# Patient Record
Sex: Male | Born: 1950
Health system: Southern US, Community
[De-identification: ages and names within clinical notes are randomized; demographics above are authoritative.]

## PROBLEM LIST (undated history)

## (undated) DIAGNOSIS — I10 Essential (primary) hypertension: Secondary | ICD-10-CM

## (undated) DIAGNOSIS — I700 Atherosclerosis of aorta: Secondary | ICD-10-CM

## (undated) DIAGNOSIS — N2889 Other specified disorders of kidney and ureter: Secondary | ICD-10-CM

## (undated) DIAGNOSIS — C649 Malignant neoplasm of unspecified kidney, except renal pelvis: Secondary | ICD-10-CM

## (undated) DIAGNOSIS — C3490 Malignant neoplasm of unspecified part of unspecified bronchus or lung: Secondary | ICD-10-CM

## (undated) DIAGNOSIS — G8929 Other chronic pain: Secondary | ICD-10-CM

## (undated) DIAGNOSIS — I2510 Atherosclerotic heart disease of native coronary artery without angina pectoris: Secondary | ICD-10-CM

## (undated) DIAGNOSIS — K219 Gastro-esophageal reflux disease without esophagitis: Secondary | ICD-10-CM

## (undated) DIAGNOSIS — F419 Anxiety disorder, unspecified: Secondary | ICD-10-CM

## (undated) DIAGNOSIS — M1990 Unspecified osteoarthritis, unspecified site: Secondary | ICD-10-CM

## (undated) DIAGNOSIS — R2681 Unsteadiness on feet: Secondary | ICD-10-CM

## (undated) DIAGNOSIS — E041 Nontoxic single thyroid nodule: Secondary | ICD-10-CM

## (undated) DIAGNOSIS — M545 Low back pain, unspecified: Secondary | ICD-10-CM

## (undated) DIAGNOSIS — J449 Chronic obstructive pulmonary disease, unspecified: Secondary | ICD-10-CM

## (undated) DIAGNOSIS — F32A Depression, unspecified: Secondary | ICD-10-CM

## (undated) DIAGNOSIS — R0600 Dyspnea, unspecified: Secondary | ICD-10-CM

## (undated) HISTORY — DX: Essential (primary) hypertension: I10

## (undated) HISTORY — DX: Atherosclerotic heart disease of native coronary artery without angina pectoris: I25.10

## (undated) HISTORY — PX: NECK SURGERY: SHX720

## (undated) HISTORY — DX: Chronic obstructive pulmonary disease, unspecified: J44.9

## (undated) HISTORY — PX: CHOLECYSTECTOMY: SHX55

## (undated) HISTORY — DX: Low back pain, unspecified: M54.50

## (undated) HISTORY — DX: Atherosclerosis of aorta: I70.0

## (undated) HISTORY — DX: Malignant neoplasm of unspecified kidney, except renal pelvis: C64.9

## (undated) HISTORY — DX: Other chronic pain: G89.29

## (undated) HISTORY — PX: FRACTURE SURGERY: SHX138

## (undated) HISTORY — DX: Unsteadiness on feet: R26.81

## (undated) MED FILL — Dexamethasone Sodium Phosphate Inj 100 MG/10ML: INTRAMUSCULAR | Qty: 2 | Status: AC

---
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
## 2007-12-12 ENCOUNTER — Encounter: Admission: RE | Admit: 2007-12-12 | Discharge: 2008-01-24 | Payer: Self-pay | Admitting: Neurosurgery

## 2014-05-02 DIAGNOSIS — F331 Major depressive disorder, recurrent, moderate: Secondary | ICD-10-CM | POA: Diagnosis not present

## 2014-05-02 DIAGNOSIS — I10 Essential (primary) hypertension: Secondary | ICD-10-CM | POA: Diagnosis not present

## 2014-05-02 DIAGNOSIS — E8881 Metabolic syndrome: Secondary | ICD-10-CM | POA: Diagnosis not present

## 2014-05-02 DIAGNOSIS — E6609 Other obesity due to excess calories: Secondary | ICD-10-CM | POA: Diagnosis not present

## 2014-05-02 DIAGNOSIS — F17210 Nicotine dependence, cigarettes, uncomplicated: Secondary | ICD-10-CM | POA: Diagnosis not present

## 2014-08-01 DIAGNOSIS — E6609 Other obesity due to excess calories: Secondary | ICD-10-CM | POA: Diagnosis not present

## 2014-08-01 DIAGNOSIS — E8881 Metabolic syndrome: Secondary | ICD-10-CM | POA: Diagnosis not present

## 2014-08-01 DIAGNOSIS — I10 Essential (primary) hypertension: Secondary | ICD-10-CM | POA: Diagnosis not present

## 2014-08-05 DIAGNOSIS — E8881 Metabolic syndrome: Secondary | ICD-10-CM | POA: Diagnosis not present

## 2014-08-05 DIAGNOSIS — E6609 Other obesity due to excess calories: Secondary | ICD-10-CM | POA: Diagnosis not present

## 2014-08-05 DIAGNOSIS — I10 Essential (primary) hypertension: Secondary | ICD-10-CM | POA: Diagnosis not present

## 2014-08-05 DIAGNOSIS — F17210 Nicotine dependence, cigarettes, uncomplicated: Secondary | ICD-10-CM | POA: Diagnosis not present

## 2014-08-05 DIAGNOSIS — F331 Major depressive disorder, recurrent, moderate: Secondary | ICD-10-CM | POA: Diagnosis not present

## 2014-09-18 DIAGNOSIS — M545 Low back pain: Secondary | ICD-10-CM | POA: Diagnosis not present

## 2014-09-18 DIAGNOSIS — M6281 Muscle weakness (generalized): Secondary | ICD-10-CM | POA: Diagnosis not present

## 2014-09-18 DIAGNOSIS — R262 Difficulty in walking, not elsewhere classified: Secondary | ICD-10-CM | POA: Diagnosis not present

## 2014-12-10 DIAGNOSIS — F331 Major depressive disorder, recurrent, moderate: Secondary | ICD-10-CM | POA: Diagnosis not present

## 2014-12-10 DIAGNOSIS — M5441 Lumbago with sciatica, right side: Secondary | ICD-10-CM | POA: Diagnosis not present

## 2014-12-10 DIAGNOSIS — I10 Essential (primary) hypertension: Secondary | ICD-10-CM | POA: Diagnosis not present

## 2014-12-10 DIAGNOSIS — M50022 Cervical disc disorder at C5-C6 level with myelopathy: Secondary | ICD-10-CM | POA: Diagnosis not present

## 2014-12-10 DIAGNOSIS — F17210 Nicotine dependence, cigarettes, uncomplicated: Secondary | ICD-10-CM | POA: Diagnosis not present

## 2014-12-10 DIAGNOSIS — Z23 Encounter for immunization: Secondary | ICD-10-CM | POA: Diagnosis not present

## 2014-12-22 DIAGNOSIS — M6281 Muscle weakness (generalized): Secondary | ICD-10-CM | POA: Diagnosis not present

## 2014-12-22 DIAGNOSIS — M545 Low back pain: Secondary | ICD-10-CM | POA: Diagnosis not present

## 2014-12-24 DIAGNOSIS — M1288 Other specific arthropathies, not elsewhere classified, other specified site: Secondary | ICD-10-CM | POA: Diagnosis not present

## 2014-12-24 DIAGNOSIS — M47816 Spondylosis without myelopathy or radiculopathy, lumbar region: Secondary | ICD-10-CM | POA: Diagnosis not present

## 2014-12-24 DIAGNOSIS — M4712 Other spondylosis with myelopathy, cervical region: Secondary | ICD-10-CM | POA: Diagnosis not present

## 2014-12-30 DIAGNOSIS — M545 Low back pain: Secondary | ICD-10-CM | POA: Diagnosis not present

## 2014-12-30 DIAGNOSIS — M6281 Muscle weakness (generalized): Secondary | ICD-10-CM | POA: Diagnosis not present

## 2015-01-05 DIAGNOSIS — M7052 Other bursitis of knee, left knee: Secondary | ICD-10-CM | POA: Diagnosis not present

## 2015-01-05 DIAGNOSIS — Z79899 Other long term (current) drug therapy: Secondary | ICD-10-CM | POA: Diagnosis not present

## 2015-01-05 DIAGNOSIS — M25562 Pain in left knee: Secondary | ICD-10-CM | POA: Diagnosis not present

## 2015-01-05 DIAGNOSIS — I10 Essential (primary) hypertension: Secondary | ICD-10-CM | POA: Diagnosis not present

## 2015-01-05 DIAGNOSIS — M179 Osteoarthritis of knee, unspecified: Secondary | ICD-10-CM | POA: Diagnosis not present

## 2015-01-05 DIAGNOSIS — M1712 Unilateral primary osteoarthritis, left knee: Secondary | ICD-10-CM | POA: Diagnosis not present

## 2015-01-05 DIAGNOSIS — M7042 Prepatellar bursitis, left knee: Secondary | ICD-10-CM | POA: Diagnosis not present

## 2015-01-09 DIAGNOSIS — M545 Low back pain: Secondary | ICD-10-CM | POA: Diagnosis not present

## 2015-01-09 DIAGNOSIS — M4712 Other spondylosis with myelopathy, cervical region: Secondary | ICD-10-CM | POA: Diagnosis not present

## 2015-01-09 DIAGNOSIS — M47816 Spondylosis without myelopathy or radiculopathy, lumbar region: Secondary | ICD-10-CM | POA: Diagnosis not present

## 2015-01-13 DIAGNOSIS — M47816 Spondylosis without myelopathy or radiculopathy, lumbar region: Secondary | ICD-10-CM | POA: Diagnosis not present

## 2015-01-13 DIAGNOSIS — F17210 Nicotine dependence, cigarettes, uncomplicated: Secondary | ICD-10-CM | POA: Diagnosis not present

## 2015-01-13 DIAGNOSIS — I10 Essential (primary) hypertension: Secondary | ICD-10-CM | POA: Diagnosis not present

## 2015-01-13 DIAGNOSIS — G8929 Other chronic pain: Secondary | ICD-10-CM | POA: Diagnosis not present

## 2015-01-13 DIAGNOSIS — F329 Major depressive disorder, single episode, unspecified: Secondary | ICD-10-CM | POA: Diagnosis not present

## 2015-01-13 DIAGNOSIS — Z79899 Other long term (current) drug therapy: Secondary | ICD-10-CM | POA: Diagnosis not present

## 2015-01-13 DIAGNOSIS — M545 Low back pain: Secondary | ICD-10-CM | POA: Diagnosis not present

## 2015-01-13 DIAGNOSIS — M4712 Other spondylosis with myelopathy, cervical region: Secondary | ICD-10-CM | POA: Diagnosis not present

## 2015-02-26 DIAGNOSIS — M545 Low back pain: Secondary | ICD-10-CM | POA: Diagnosis not present

## 2015-02-26 DIAGNOSIS — M47816 Spondylosis without myelopathy or radiculopathy, lumbar region: Secondary | ICD-10-CM | POA: Diagnosis not present

## 2015-04-06 DIAGNOSIS — E8881 Metabolic syndrome: Secondary | ICD-10-CM | POA: Diagnosis not present

## 2015-04-06 DIAGNOSIS — F17210 Nicotine dependence, cigarettes, uncomplicated: Secondary | ICD-10-CM | POA: Diagnosis not present

## 2015-04-06 DIAGNOSIS — I10 Essential (primary) hypertension: Secondary | ICD-10-CM | POA: Diagnosis not present

## 2015-04-06 DIAGNOSIS — M5002 Cervical disc disorder with myelopathy, mid-cervical region, unspecified level: Secondary | ICD-10-CM | POA: Diagnosis not present

## 2015-04-06 DIAGNOSIS — F331 Major depressive disorder, recurrent, moderate: Secondary | ICD-10-CM | POA: Diagnosis not present

## 2015-04-13 DIAGNOSIS — F331 Major depressive disorder, recurrent, moderate: Secondary | ICD-10-CM | POA: Diagnosis not present

## 2015-04-13 DIAGNOSIS — J301 Allergic rhinitis due to pollen: Secondary | ICD-10-CM | POA: Diagnosis not present

## 2015-04-13 DIAGNOSIS — Z0001 Encounter for general adult medical examination with abnormal findings: Secondary | ICD-10-CM | POA: Diagnosis not present

## 2015-04-13 DIAGNOSIS — I10 Essential (primary) hypertension: Secondary | ICD-10-CM | POA: Diagnosis not present

## 2015-04-13 DIAGNOSIS — F17210 Nicotine dependence, cigarettes, uncomplicated: Secondary | ICD-10-CM | POA: Diagnosis not present

## 2015-04-13 DIAGNOSIS — E8881 Metabolic syndrome: Secondary | ICD-10-CM | POA: Diagnosis not present

## 2015-08-14 DIAGNOSIS — E6609 Other obesity due to excess calories: Secondary | ICD-10-CM | POA: Diagnosis not present

## 2015-08-14 DIAGNOSIS — E8881 Metabolic syndrome: Secondary | ICD-10-CM | POA: Diagnosis not present

## 2015-08-14 DIAGNOSIS — F17210 Nicotine dependence, cigarettes, uncomplicated: Secondary | ICD-10-CM | POA: Diagnosis not present

## 2015-08-14 DIAGNOSIS — I10 Essential (primary) hypertension: Secondary | ICD-10-CM | POA: Diagnosis not present

## 2015-08-14 DIAGNOSIS — F331 Major depressive disorder, recurrent, moderate: Secondary | ICD-10-CM | POA: Diagnosis not present

## 2015-11-18 DIAGNOSIS — E8881 Metabolic syndrome: Secondary | ICD-10-CM | POA: Diagnosis not present

## 2015-11-18 DIAGNOSIS — E6609 Other obesity due to excess calories: Secondary | ICD-10-CM | POA: Diagnosis not present

## 2015-11-18 DIAGNOSIS — F331 Major depressive disorder, recurrent, moderate: Secondary | ICD-10-CM | POA: Diagnosis not present

## 2015-11-18 DIAGNOSIS — I10 Essential (primary) hypertension: Secondary | ICD-10-CM | POA: Diagnosis not present

## 2015-11-18 DIAGNOSIS — Z23 Encounter for immunization: Secondary | ICD-10-CM | POA: Diagnosis not present

## 2015-11-18 DIAGNOSIS — F17210 Nicotine dependence, cigarettes, uncomplicated: Secondary | ICD-10-CM | POA: Diagnosis not present

## 2015-11-18 DIAGNOSIS — Z1389 Encounter for screening for other disorder: Secondary | ICD-10-CM | POA: Diagnosis not present

## 2015-11-20 DIAGNOSIS — Z9189 Other specified personal risk factors, not elsewhere classified: Secondary | ICD-10-CM | POA: Diagnosis not present

## 2016-02-24 DIAGNOSIS — I10 Essential (primary) hypertension: Secondary | ICD-10-CM | POA: Diagnosis not present

## 2016-02-24 DIAGNOSIS — M5137 Other intervertebral disc degeneration, lumbosacral region: Secondary | ICD-10-CM | POA: Diagnosis not present

## 2016-02-24 DIAGNOSIS — Z79891 Long term (current) use of opiate analgesic: Secondary | ICD-10-CM | POA: Diagnosis not present

## 2016-05-26 DIAGNOSIS — Z23 Encounter for immunization: Secondary | ICD-10-CM | POA: Diagnosis not present

## 2016-05-26 DIAGNOSIS — Z1212 Encounter for screening for malignant neoplasm of rectum: Secondary | ICD-10-CM | POA: Diagnosis not present

## 2016-05-26 DIAGNOSIS — I10 Essential (primary) hypertension: Secondary | ICD-10-CM | POA: Diagnosis not present

## 2016-05-26 DIAGNOSIS — M5137 Other intervertebral disc degeneration, lumbosacral region: Secondary | ICD-10-CM | POA: Diagnosis not present

## 2016-05-26 DIAGNOSIS — Z79891 Long term (current) use of opiate analgesic: Secondary | ICD-10-CM | POA: Diagnosis not present

## 2016-05-26 DIAGNOSIS — Z0001 Encounter for general adult medical examination with abnormal findings: Secondary | ICD-10-CM | POA: Diagnosis not present

## 2016-06-07 DIAGNOSIS — Z9189 Other specified personal risk factors, not elsewhere classified: Secondary | ICD-10-CM | POA: Diagnosis not present

## 2016-06-07 DIAGNOSIS — Z79891 Long term (current) use of opiate analgesic: Secondary | ICD-10-CM | POA: Diagnosis not present

## 2016-08-18 DIAGNOSIS — Z9189 Other specified personal risk factors, not elsewhere classified: Secondary | ICD-10-CM | POA: Diagnosis not present

## 2016-08-18 DIAGNOSIS — I10 Essential (primary) hypertension: Secondary | ICD-10-CM | POA: Diagnosis not present

## 2016-08-18 DIAGNOSIS — M5002 Cervical disc disorder with myelopathy, mid-cervical region, unspecified level: Secondary | ICD-10-CM | POA: Diagnosis not present

## 2016-08-23 DIAGNOSIS — Z79891 Long term (current) use of opiate analgesic: Secondary | ICD-10-CM | POA: Diagnosis not present

## 2016-08-23 DIAGNOSIS — J301 Allergic rhinitis due to pollen: Secondary | ICD-10-CM | POA: Diagnosis not present

## 2016-08-23 DIAGNOSIS — M5137 Other intervertebral disc degeneration, lumbosacral region: Secondary | ICD-10-CM | POA: Diagnosis not present

## 2016-08-23 DIAGNOSIS — I10 Essential (primary) hypertension: Secondary | ICD-10-CM | POA: Diagnosis not present

## 2016-08-24 ENCOUNTER — Encounter (HOSPITAL_COMMUNITY): Payer: Self-pay

## 2016-08-24 ENCOUNTER — Ambulatory Visit (HOSPITAL_COMMUNITY): Payer: Medicare Other | Attending: Family Medicine

## 2016-08-24 DIAGNOSIS — M6281 Muscle weakness (generalized): Secondary | ICD-10-CM | POA: Insufficient documentation

## 2016-08-24 NOTE — Therapy (Addendum)
Reginald Berry [address] [place], [place], [address] Phone: [phone]   Fax:  [phone]  Occupational Therapy Wheelchair Evaluation  Patient Details  Name: Reginald Berry MRN: [id number] Date of Birth: 12-Nov-1950 Referring Provider: Gar Ponto, MD  Encounter Date: 08/24/2016      OT End of Session - 08/24/16 1634    Visit Number 1   Number of Visits 1   Authorization Type 1) [employer] medicare HMO 2) medicaid   Authorization Time Period before 10th visit   Authorization - Visit Number 1   Authorization - Number of Visits 10   OT Start Time 1351   OT Stop Time 1411   OT Time Calculation (min) 20 min   Activity Tolerance Patient tolerated treatment well   Behavior During Therapy [facility] for tasks assessed/performed      History reviewed. No pertinent past medical history.  No past surgical history on file.  There were no vitals filed for this visit.         [facility] OT Assessment - 08/24/16 1633      Assessment   Diagnosis wheelchair evaluation   Referring Provider Gar Ponto, MD      Date: 08/24/16 Patient Name: Reginald Berry Address: [address], [place], [place] [address] DOB: 08-15-2050   To Whom It May Concern,    Reginald Berry was seen today in this clinic for a power wheelchair evaluation. Reginald Berry has a past medical history that includes HTN, chronic back pain, right tibia fracture, chronic GERD, obesity, gait abnormality, essential tremors in BUE and BLE, cervical myelopathy, edema in bilateral feet, and chronic depression. Reginald Berry's surgical history includes: Right ORIF tibia in 2013.  Reginald Berry lives alone in a 1-story mobile home with a ramped entrance. He reports that he is responsible for all meal prep and housekeeping activities. He is required to complete all daily tasks seated due to his severe chronic back pain. During evaluation while seated, he reports a pain level of 6/10 in his lower back and  right hip/leg although it frequently will rise to 10/10.  Reginald Berry presently has a borrowed power wheelchair that is ill fitting and does not allow for adequate space in the hips to allow for proper weight shifting needs. Reginald Berry states that he spends the majority of the day in his wheelchair or recliner. A FULL PHYSICAL ASSESSMENT REVEALS THE FOLLOWING    Existing Equipment:   Reginald Berry owns a manual wheelchair, various canes and walkers that he has collected over the years, and a shower chair.  Transfers:  Reginald Berry lives alone and completes all functional transfers at [facility] I level as long as he has something to hold, brace against and/or pull himself up with.   Head and Neck: WFL  Trunk: Limited active rotation left and right  Hip: Limited active movement with strength at 3-/5 bilaterally.  Upper Extremities: Bilateral shoulder A/ROM is [facility]. Bilateral Shoulder strength is measuring at a 5/5. Bilateral gross grasp is functional.   Lower Extremities:  Bilateral knee and ankles present with limited active movement during knee flexion/extension and ankle dorsiflexion and extension. Both knees and ankle strength measure at 3-/5.   Weight Shifting Ability:  Reginald Berry is able to weight shift independently.  Skin Integrity:  No history of skin breakdown. Reginald Berry does have several sores on bilateral arms which he reports occur frequently.  Cognition:  WFL  Activity Tolerance: Poor. Reginald Berry reports that he is  able to walk 10 yards at the most before requiring a seated rest break due to his back or right leg giving out. Reginald Berry reports that he is able to complete approximately 5 minutes of activity before experiencing increased exertion. It is noted that during the manual muscle-testing portion of the wheelchair evaluation, Reginald Berry was fatigued and short of breathe.    GOALS/OBJECTIVE OF SEATING INTERVENTION:  Recommendation: Reginald Berry would benefit from a power  wheelchair for use in his home and in the community. Reginald Berry current manual wheelchair does not provide the appropriate amount of support that he requires due to his physical impairments and he is not able to functionally self-propel himself due to severe chronic back pain which limits his ability to due so. His decreased activity tolerance impairs his ability to self-propel a manual wheelchair.  A scooter would not be beneficial for Reginald Berry. He does not have the adequate space needed in his home for the wide turning radius required by the scooter. Reginald Berry is motivated and willing to use his power wheelchair and is physically and mentally able to operate a power wheelchair. A power wheelchair will be the best mobility device for Reginald Berry. A power wheelchair would increase Reginald Berry's safety and ability to be more mobile during daily activities and his quality of life.   [facility] Manufacturer Mfg's Item # Description   [phone] [place] Elite HD Power Wheelchair  [place] [id number] NF-22 Batteries (x2[place]  E0973 Pride [id number] Flip up/Ht Adjust Armrest, Left  [phone] Pride [id number] Flip up/Ht Adjust Armrest, Right  E1028 Reginald Berry [id number] Swing Away Joystick Bracket   [phone]: Terrilee Files Elite HD Power Wheelchair Patient has physical and cognitive capabilities to use the recommended power mobility device. Patient's home provides adequate access between rooms, maneuvering space, and surfaces for the operation of a power chair. This chair is more maneuverable than comparably priced traditional power wheelchairs due to the drive wheels being directly at the center of gravity of the user. This maneuverability is required to enable the patient to negotiate the tight turns, small spaces, and obstacles characteristic of the home environment. The patient is unable to self-propel a manual wheelchair, or operate the steering tiller of a scooter secondary to UE weakness and size. The patient  is unable to safely transfer onto the platform of a scooter, due to size and instablity. Patient requires heavy duty power wheelchair, due to weight over 300lbs. M0947: Height Adjustable Armrests  Height adjustable Full length armrests are necessary to support the shoulder joint and surrounding structures. Lack of arm support can result in chronic stretch and damage of the joint capsule, ligaments, nerves, and blood vessels. The patient requires the use of height adjustable armrests to achieve proper armrests height for her shoulders to be adequately supported. S9628: [place] The swing away joystick mount allows for the joystick to be moved during transfers, as well as allowing the patient to navigate closer to tables and other surfaces to prepare meals. Z6629: NF22 Batteries Batteries are required to power the wheelchair for use.  If you require any further information concerning Mr. Kertz's positioning, independence or mobility needs; or any further information why a lesser device will not work, please do not hesitate to contact me at [place], [place] [place], [place] [address] [phone].     Thank you for this referral,   ____________________        Mickel Baas  Rianna Lukes OTR/L, CBIS           Plan - 09-20-2016 1635    OT Frequency One time visit   Clinical Decision Making --   Consulted and Agree with Plan of Care Patient      Patient will benefit from skilled therapeutic intervention in order to improve the following deficits and impairments:  Decreased mobility  Visit Diagnosis: Muscle weakness (generalized)      G-Codes - September 20, 2016 1636    Functional Assessment Tool Used (Outpatient only) clinical judgment   Functional Limitation Mobility: Walking and moving around   Mobility: Walking and Moving Around Current Status (H4742) At least 60 percent but less than 80 percent impaired, limited or restricted    Mobility: Walking and Moving Around Goal Status [phone]) At least 60 percent but less than 80 percent impaired, limited or restricted   Mobility: Walking and Moving Around Discharge Status [phone]) At least 60 percent but less than 80 percent impaired, limited or restricted      Problem List There are no active problems to display for this patient.  Ailene Ravel, OTR/L,CBIS  [phone]  09-20-16, 4:38 PM  [facility] [address] [place], [place], [address] Phone: [phone]   Fax:  [phone]  Name: KHADAR MONGER MRN: [id number] Date of Birth: [birth year]

## 2016-09-19 DIAGNOSIS — Z23 Encounter for immunization: Secondary | ICD-10-CM | POA: Diagnosis not present

## 2016-10-24 DIAGNOSIS — M5137 Other intervertebral disc degeneration, lumbosacral region: Secondary | ICD-10-CM | POA: Diagnosis not present

## 2016-10-24 DIAGNOSIS — I10 Essential (primary) hypertension: Secondary | ICD-10-CM | POA: Diagnosis not present

## 2016-12-14 DIAGNOSIS — Z1212 Encounter for screening for malignant neoplasm of rectum: Secondary | ICD-10-CM | POA: Diagnosis not present

## 2016-12-14 DIAGNOSIS — M5137 Other intervertebral disc degeneration, lumbosacral region: Secondary | ICD-10-CM | POA: Diagnosis not present

## 2016-12-14 DIAGNOSIS — I10 Essential (primary) hypertension: Secondary | ICD-10-CM | POA: Diagnosis not present

## 2016-12-14 DIAGNOSIS — Z23 Encounter for immunization: Secondary | ICD-10-CM | POA: Diagnosis not present

## 2017-02-17 DIAGNOSIS — I10 Essential (primary) hypertension: Secondary | ICD-10-CM | POA: Diagnosis not present

## 2017-02-17 DIAGNOSIS — J351 Hypertrophy of tonsils: Secondary | ICD-10-CM | POA: Diagnosis not present

## 2017-02-17 DIAGNOSIS — Z79899 Other long term (current) drug therapy: Secondary | ICD-10-CM | POA: Diagnosis not present

## 2017-02-17 DIAGNOSIS — L089 Local infection of the skin and subcutaneous tissue, unspecified: Secondary | ICD-10-CM | POA: Diagnosis not present

## 2017-02-17 DIAGNOSIS — L720 Epidermal cyst: Secondary | ICD-10-CM | POA: Diagnosis not present

## 2017-02-18 DIAGNOSIS — I10 Essential (primary) hypertension: Secondary | ICD-10-CM | POA: Diagnosis not present

## 2017-02-18 DIAGNOSIS — Z48817 Encounter for surgical aftercare following surgery on the skin and subcutaneous tissue: Secondary | ICD-10-CM | POA: Diagnosis not present

## 2017-02-18 DIAGNOSIS — Z4801 Encounter for change or removal of surgical wound dressing: Secondary | ICD-10-CM | POA: Diagnosis not present

## 2017-02-18 DIAGNOSIS — Z79899 Other long term (current) drug therapy: Secondary | ICD-10-CM | POA: Diagnosis not present

## 2017-02-19 DIAGNOSIS — Z4801 Encounter for change or removal of surgical wound dressing: Secondary | ICD-10-CM | POA: Diagnosis not present

## 2017-02-19 DIAGNOSIS — L0211 Cutaneous abscess of neck: Secondary | ICD-10-CM | POA: Diagnosis not present

## 2017-02-19 DIAGNOSIS — Z79899 Other long term (current) drug therapy: Secondary | ICD-10-CM | POA: Diagnosis not present

## 2017-02-19 DIAGNOSIS — I10 Essential (primary) hypertension: Secondary | ICD-10-CM | POA: Diagnosis not present

## 2017-02-19 DIAGNOSIS — Z48817 Encounter for surgical aftercare following surgery on the skin and subcutaneous tissue: Secondary | ICD-10-CM | POA: Diagnosis not present

## 2017-03-06 DIAGNOSIS — M5137 Other intervertebral disc degeneration, lumbosacral region: Secondary | ICD-10-CM | POA: Diagnosis not present

## 2017-03-06 DIAGNOSIS — M5002 Cervical disc disorder with myelopathy, mid-cervical region, unspecified level: Secondary | ICD-10-CM | POA: Diagnosis not present

## 2017-03-06 DIAGNOSIS — I10 Essential (primary) hypertension: Secondary | ICD-10-CM | POA: Diagnosis not present

## 2017-03-14 DIAGNOSIS — I10 Essential (primary) hypertension: Secondary | ICD-10-CM | POA: Diagnosis not present

## 2017-03-14 DIAGNOSIS — Z9189 Other specified personal risk factors, not elsewhere classified: Secondary | ICD-10-CM | POA: Diagnosis not present

## 2017-03-17 DIAGNOSIS — I10 Essential (primary) hypertension: Secondary | ICD-10-CM | POA: Diagnosis not present

## 2017-03-17 DIAGNOSIS — Z79891 Long term (current) use of opiate analgesic: Secondary | ICD-10-CM | POA: Diagnosis not present

## 2017-03-17 DIAGNOSIS — M5000 Cervical disc disorder with myelopathy, unspecified cervical region: Secondary | ICD-10-CM | POA: Diagnosis not present

## 2017-03-17 DIAGNOSIS — M5137 Other intervertebral disc degeneration, lumbosacral region: Secondary | ICD-10-CM | POA: Diagnosis not present

## 2017-03-28 DIAGNOSIS — Z5181 Encounter for therapeutic drug level monitoring: Secondary | ICD-10-CM | POA: Diagnosis not present

## 2017-04-06 DIAGNOSIS — M5002 Cervical disc disorder with myelopathy, mid-cervical region, unspecified level: Secondary | ICD-10-CM | POA: Diagnosis not present

## 2017-05-04 DIAGNOSIS — M5002 Cervical disc disorder with myelopathy, mid-cervical region, unspecified level: Secondary | ICD-10-CM | POA: Diagnosis not present

## 2017-06-04 DIAGNOSIS — M5002 Cervical disc disorder with myelopathy, mid-cervical region, unspecified level: Secondary | ICD-10-CM | POA: Diagnosis not present

## 2017-06-13 DIAGNOSIS — Z79899 Other long term (current) drug therapy: Secondary | ICD-10-CM | POA: Diagnosis not present

## 2017-06-13 DIAGNOSIS — K8020 Calculus of gallbladder without cholecystitis without obstruction: Secondary | ICD-10-CM | POA: Diagnosis not present

## 2017-06-13 DIAGNOSIS — I493 Ventricular premature depolarization: Secondary | ICD-10-CM | POA: Diagnosis not present

## 2017-06-13 DIAGNOSIS — E860 Dehydration: Secondary | ICD-10-CM | POA: Diagnosis not present

## 2017-06-13 DIAGNOSIS — J810 Acute pulmonary edema: Secondary | ICD-10-CM | POA: Diagnosis not present

## 2017-06-13 DIAGNOSIS — R918 Other nonspecific abnormal finding of lung field: Secondary | ICD-10-CM | POA: Diagnosis not present

## 2017-06-13 DIAGNOSIS — R001 Bradycardia, unspecified: Secondary | ICD-10-CM | POA: Diagnosis not present

## 2017-06-13 DIAGNOSIS — M549 Dorsalgia, unspecified: Secondary | ICD-10-CM | POA: Diagnosis not present

## 2017-06-13 DIAGNOSIS — Z833 Family history of diabetes mellitus: Secondary | ICD-10-CM | POA: Diagnosis not present

## 2017-06-13 DIAGNOSIS — T5891XA Toxic effect of carbon monoxide from unspecified source, accidental (unintentional), initial encounter: Secondary | ICD-10-CM | POA: Diagnosis not present

## 2017-06-13 DIAGNOSIS — I10 Essential (primary) hypertension: Secondary | ICD-10-CM | POA: Diagnosis not present

## 2017-06-13 DIAGNOSIS — Z8249 Family history of ischemic heart disease and other diseases of the circulatory system: Secondary | ICD-10-CM | POA: Diagnosis not present

## 2017-06-13 DIAGNOSIS — K828 Other specified diseases of gallbladder: Secondary | ICD-10-CM | POA: Diagnosis not present

## 2017-06-13 DIAGNOSIS — R509 Fever, unspecified: Secondary | ICD-10-CM | POA: Diagnosis not present

## 2017-06-13 DIAGNOSIS — R61 Generalized hyperhidrosis: Secondary | ICD-10-CM | POA: Diagnosis not present

## 2017-06-13 DIAGNOSIS — R0789 Other chest pain: Secondary | ICD-10-CM | POA: Diagnosis not present

## 2017-06-13 DIAGNOSIS — Z792 Long term (current) use of antibiotics: Secondary | ICD-10-CM | POA: Diagnosis not present

## 2017-06-13 DIAGNOSIS — N179 Acute kidney failure, unspecified: Secondary | ICD-10-CM | POA: Diagnosis not present

## 2017-06-13 DIAGNOSIS — R945 Abnormal results of liver function studies: Secondary | ICD-10-CM | POA: Diagnosis not present

## 2017-06-13 DIAGNOSIS — R740 Nonspecific elevation of levels of transaminase and lactic acid dehydrogenase [LDH]: Secondary | ICD-10-CM | POA: Diagnosis not present

## 2017-06-13 DIAGNOSIS — G8929 Other chronic pain: Secondary | ICD-10-CM | POA: Diagnosis not present

## 2017-06-13 DIAGNOSIS — Z993 Dependence on wheelchair: Secondary | ICD-10-CM | POA: Diagnosis not present

## 2017-06-13 DIAGNOSIS — S14109S Unspecified injury at unspecified level of cervical spinal cord, sequela: Secondary | ICD-10-CM | POA: Diagnosis not present

## 2017-06-13 DIAGNOSIS — K8050 Calculus of bile duct without cholangitis or cholecystitis without obstruction: Secondary | ICD-10-CM | POA: Diagnosis not present

## 2017-06-13 DIAGNOSIS — T5894XA Toxic effect of carbon monoxide from unspecified source, undetermined, initial encounter: Secondary | ICD-10-CM | POA: Diagnosis not present

## 2017-06-13 DIAGNOSIS — K8012 Calculus of gallbladder with acute and chronic cholecystitis without obstruction: Secondary | ICD-10-CM | POA: Diagnosis not present

## 2017-06-13 DIAGNOSIS — R791 Abnormal coagulation profile: Secondary | ICD-10-CM | POA: Diagnosis not present

## 2017-06-13 DIAGNOSIS — Z87891 Personal history of nicotine dependence: Secondary | ICD-10-CM | POA: Diagnosis not present

## 2017-06-13 DIAGNOSIS — K8042 Calculus of bile duct with acute cholecystitis without obstruction: Secondary | ICD-10-CM | POA: Diagnosis not present

## 2017-06-13 DIAGNOSIS — Z7982 Long term (current) use of aspirin: Secondary | ICD-10-CM | POA: Diagnosis not present

## 2017-06-13 DIAGNOSIS — R079 Chest pain, unspecified: Secondary | ICD-10-CM | POA: Diagnosis not present

## 2017-06-15 DIAGNOSIS — R945 Abnormal results of liver function studies: Secondary | ICD-10-CM | POA: Diagnosis not present

## 2017-06-15 DIAGNOSIS — K8020 Calculus of gallbladder without cholecystitis without obstruction: Secondary | ICD-10-CM | POA: Diagnosis not present

## 2017-06-19 MED ORDER — GENERIC EXTERNAL MEDICATION
15.00 | Status: DC
Start: 2017-06-17 — End: 2017-06-19

## 2017-06-19 MED ORDER — ONDANSETRON 4 MG PO TBDP
4.00 | ORAL_TABLET | ORAL | Status: DC
Start: ? — End: 2017-06-19

## 2017-06-19 MED ORDER — BUPROPION HCL ER (XL) 150 MG PO TB24
150.00 | ORAL_TABLET | ORAL | Status: DC
Start: 2017-06-18 — End: 2017-06-19

## 2017-06-19 MED ORDER — PANTOPRAZOLE SODIUM 40 MG PO TBEC
40.00 | DELAYED_RELEASE_TABLET | ORAL | Status: DC
Start: 2017-06-17 — End: 2017-06-19

## 2017-06-19 MED ORDER — ASPIRIN EC 81 MG PO TBEC
81.00 | DELAYED_RELEASE_TABLET | ORAL | Status: DC
Start: 2017-06-18 — End: 2017-06-19

## 2017-06-19 MED ORDER — HYDROCODONE-ACETAMINOPHEN 5-325 MG PO TABS
1.00 | ORAL_TABLET | ORAL | Status: DC
Start: ? — End: 2017-06-19

## 2017-06-19 MED ORDER — ACETAMINOPHEN 325 MG PO TABS
325.00 | ORAL_TABLET | ORAL | Status: DC
Start: 2017-06-17 — End: 2017-06-19

## 2017-06-27 DIAGNOSIS — R918 Other nonspecific abnormal finding of lung field: Secondary | ICD-10-CM | POA: Diagnosis not present

## 2017-06-27 DIAGNOSIS — R9389 Abnormal findings on diagnostic imaging of other specified body structures: Secondary | ICD-10-CM | POA: Diagnosis not present

## 2017-06-27 DIAGNOSIS — E049 Nontoxic goiter, unspecified: Secondary | ICD-10-CM | POA: Diagnosis not present

## 2017-06-27 DIAGNOSIS — K8020 Calculus of gallbladder without cholecystitis without obstruction: Secondary | ICD-10-CM | POA: Diagnosis not present

## 2017-06-28 ENCOUNTER — Other Ambulatory Visit (HOSPITAL_COMMUNITY): Payer: Self-pay | Admitting: Family Medicine

## 2017-06-28 DIAGNOSIS — C3412 Malignant neoplasm of upper lobe, left bronchus or lung: Secondary | ICD-10-CM

## 2017-07-03 ENCOUNTER — Encounter (HOSPITAL_COMMUNITY): Payer: Self-pay

## 2017-07-03 ENCOUNTER — Encounter (HOSPITAL_COMMUNITY): Payer: Medicare Other

## 2017-07-04 DIAGNOSIS — M5002 Cervical disc disorder with myelopathy, mid-cervical region, unspecified level: Secondary | ICD-10-CM | POA: Diagnosis not present

## 2017-07-10 DIAGNOSIS — Z9189 Other specified personal risk factors, not elsewhere classified: Secondary | ICD-10-CM | POA: Diagnosis not present

## 2017-07-10 DIAGNOSIS — I10 Essential (primary) hypertension: Secondary | ICD-10-CM | POA: Diagnosis not present

## 2017-07-13 DIAGNOSIS — Z0001 Encounter for general adult medical examination with abnormal findings: Secondary | ICD-10-CM | POA: Diagnosis not present

## 2017-07-13 DIAGNOSIS — J301 Allergic rhinitis due to pollen: Secondary | ICD-10-CM | POA: Diagnosis not present

## 2017-07-13 DIAGNOSIS — M5137 Other intervertebral disc degeneration, lumbosacral region: Secondary | ICD-10-CM | POA: Diagnosis not present

## 2017-07-13 DIAGNOSIS — I10 Essential (primary) hypertension: Secondary | ICD-10-CM | POA: Diagnosis not present

## 2017-07-17 ENCOUNTER — Ambulatory Visit (HOSPITAL_COMMUNITY)
Admission: RE | Admit: 2017-07-17 | Discharge: 2017-07-17 | Disposition: A | Discharge status: Home / Self Care | Payer: Medicare Other | Source: Ambulatory Visit | Attending: Family Medicine | Admitting: Family Medicine

## 2017-07-17 DIAGNOSIS — K8020 Calculus of gallbladder without cholecystitis without obstruction: Secondary | ICD-10-CM | POA: Diagnosis not present

## 2017-07-17 DIAGNOSIS — C3492 Malignant neoplasm of unspecified part of left bronchus or lung: Secondary | ICD-10-CM | POA: Diagnosis not present

## 2017-07-17 DIAGNOSIS — C3412 Malignant neoplasm of upper lobe, left bronchus or lung: Secondary | ICD-10-CM

## 2017-07-17 DIAGNOSIS — R918 Other nonspecific abnormal finding of lung field: Secondary | ICD-10-CM | POA: Diagnosis not present

## 2017-07-17 MED ORDER — FLUDEOXYGLUCOSE F - 18 (FDG) INJECTION
10.0000 | Freq: Once | INTRAVENOUS | Status: AC | PRN
Start: 2017-07-17 — End: 2017-07-17
  Administered 2017-07-17: 10 via INTRAVENOUS

## 2017-07-26 ENCOUNTER — Institutional Professional Consult (permissible substitution) (INDEPENDENT_AMBULATORY_CARE_PROVIDER_SITE_OTHER): Payer: Medicare Other | Admitting: Cardiothoracic Surgery

## 2017-07-26 ENCOUNTER — Other Ambulatory Visit: Payer: Self-pay

## 2017-07-26 ENCOUNTER — Encounter: Payer: Self-pay | Admitting: Cardiothoracic Surgery

## 2017-07-26 DIAGNOSIS — C3412 Malignant neoplasm of upper lobe, left bronchus or lung: Secondary | ICD-10-CM | POA: Insufficient documentation

## 2017-07-26 DIAGNOSIS — R918 Other nonspecific abnormal finding of lung field: Secondary | ICD-10-CM | POA: Diagnosis not present

## 2017-07-26 NOTE — Progress Notes (Signed)
[address]       [place] [address]             [phone]                    [address] Medical Record #[id number] Date of Birth: [birth year]  Referring: [name], MD Primary Care: [name], MD Primary Cardiologist: No primary care provider on file.  Chief Complaint:    Chief Complaint  Patient presents with  . Lung Cancer    new patient, PET 07/17/2017, CT 07/18/2017    History of Present Illness:    Reginald Berry 67 y.o. male is seen in the office  today for hypermetabolic 3 cm lesion in the left upper lobe.  The patient is a long-term smoker more than 45 years and greater one pack a day.  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   Last month he began having what he thought was heart pains, was evaluated in the [facility] emergency room and ultimately transferred to [facility] where he was found to have a gangrenous gallbladder and a cholecystectomy was performed.  In the evaluation prior to making a definitive diagnosis about his gallbladder a CT scan of the chest was performed to rule out pulmonary embolus.   Notes from [facility] "On 4/10 Tbili 2.9->7.4, Alk phos 150, AST/ALT 259/418, direct bilirubin 4.7, lipase 29, GGT 394. RUQ [place] 4/10 w/ cholelithiasis and a thickened gallbladder wall, CBD 5 mm with no intra-or extrahepatic biliary ductal dilatation. EGS consulted as well due to these findings. EUS 4/11 showed normal exam of the bile duct with no sludge or stones visualized. Suspect passage of stone as source for sudden rise in LFT's and presentation."      Current Activity/ Functional Status:  Patient is not independent with mobility/ambulation, transfers, ADL's, IADL's.   Zubrod Score: At the time of surgery this patient's most appropriate activity status/level should be described as: '[]'$     0     Normal activity, no symptoms '[]'$     1    Restricted in physical strenuous activity but ambulatory, able to do out light work '[]'$     2    Ambulatory and capable of self care, unable to do work activities, up and about               >50 % of waking hours                              '[x]'$     3    Only limited self care, in bed greater than 50% of waking hours '[]'$     4    Completely disabled, no self care, confined to bed or chair '[]'$     5    Moribund   Past medical history: . Arthritis  . Chronic back pain  . Foot drop, right foot  . Hypertension      Past surgical history includes recent cholecystectomy-Fracture surgery  . Orif tibia fracture 06/26/2011  Procedure: ORIF TIBIA; Surgeon: Harold Hedge, MD; Location: [place]; Service: Orthopedics; Laterality: Right;        Chronic back and leg pain secondary to logging accident Family history-patient's father died of metastatic lung cancer  Social  History   Tobacco Use  Smoking Status Current Every Day Smoker  . Packs/day: 0.50  Smokeless Tobacco Never Used  Tobacco Comment   3 a day    Social History  Patient is a long-term smoker greater than a pack a day for 45 years     Current Outpatient Medications  Medication Sig Dispense Refill  . amLODipine (NORVASC) 10 MG tablet Take 10 mg by mouth daily.    Marland Kitchen aspirin EC 81 MG tablet Take 81 mg by mouth daily.    Marland Kitchen buPROPion (WELLBUTRIN XL) 150 MG 24 hr tablet Take 150 mg by mouth daily.    . furosemide (LASIX) 40 MG tablet Take 40 mg by mouth.    . oxyCODONE-acetaminophen (PERCOCET) 10-325 MG tablet Take 1 tablet by mouth every 4 (four) hours as needed for pain.    . pantoprazole (PROTONIX) 40 MG tablet Take 40 mg by mouth daily.    . sildenafil (VIAGRA) 100 MG tablet Take 100 mg by mouth daily as needed for erectile dysfunction.     No current facility-administered medications for this visit.       Review of Systems:     Cardiac Review of Systems: [Y] = yes  or    [ N ] = no   Chest Pain [ y   ]  Resting SOB [ y  ] Exertional SOB  Blue.Reese  ]  Orthopnea [ y ]   Pedal Edema [ y  ]    Palpitations [n  ] Syncope  [n  ]   Presyncope [ n  ]   General Review of Systems: [Y] = yes [  ]=no Constitional: recent weight change [  ];  Wt loss over the last 3 months [   ] anorexia [  ]; fatigue [  ]; nausea [  ]; night sweats [  ]; fever [  ]; or chills [  ];           Eye : blurred vision [  ]; diplopia [   ]; vision changes [  ];  Amaurosis fugax[  ]; Resp: cough [ y ];  wheezing[ y ];  hemoptysis[  ]; shortness of breath[y  ]; paroxysmal nocturnal dyspnea[  ]; dyspnea on exertion[  y]; or orthopnea[  ];  GI:  gallstones[  ], vomiting[  ];  dysphagia[  ]; melena[  ];  hematochezia [  ]; heartburn[  ];   Hx of  Colonoscopy[  ]; GU: kidney stones [  ]; hematuria[  ];   dysuria [  ];  nocturia[  ];  history of     obstruction [  ]; urinary frequency [  ]             Skin: rash, swelling[  ];, hair loss[  ];  peripheral edema[  ];  or itching[  ]; Musculosketetal: myalgias[ y ];  joint swelling[  ];  joint erythema[ y ];  joint pain[ y ];  back pain[ y ];  Heme/Lymph: bruising[  ];  bleeding[  ];  anemia[  ];  Neuro: TIA[  ];  headaches[  ];  stroke[  ];  vertigo[  ];  seizures[  ];   paresthesias[y  ];  difficulty walking[  y];  Psych:depression[  ]; anxiety[  ];  Endocrine: diabetes[  ];  thyroid dysfunction[  ];  Immunizations: Flu up to date [  ]; Pneumococcal up to date [  ];  Other: y   PHYSICAL EXAMINATION:  BP 131/73 (BP Location: Left Arm, Patient Position: Sitting, Cuff Size: Large)   Pulse 83   Resp 18   Ht 6' 2"  (1.88 m)   Wt (!) 320 lb (145.2 kg)   SpO2 98% Comment: RA  BMI 41.09 kg/m  General appearance: alert, cooperative, appears older than stated age and moderately obese Head: Normocephalic, without obvious abnormality, atraumatic Neck: no adenopathy, no carotid bruit, no JVD, supple, symmetrical, trachea midline and thyroid not enlarged,  symmetric, no tenderness/mass/nodules Lymph nodes: Cervical, supraclavicular, and axillary nodes normal. Resp: rhonchi bilaterally Back: symmetric, no curvature. ROM normal. No CVA tenderness. Cardio: regular rate and rhythm, S1, S2 normal, no murmur, click, rub or gallop GI: soft, non-tender; bowel sounds normal; no masses,  no organomegaly and Patient has healing port sites from his laparoscopic cholecystectomy done several weeks ago Extremities: Patient has bilateral lower extremity edema right slightly greater than left, unable to walk, confined to wheelchair, the patient notes that he is able to stand with help but becomes very wobbly and not able to ambulate Neurologic: Bilateral lower extremity weakness  Diagnostic Studies & Laboratory data:     Recent Radiology Findings:  Final Report  CLINICAL DATA: Posterior neck abscess.  EXAM: CT NECK WITH CONTRAST  TECHNIQUE: Multidetector CT imaging of the neck was performed using the standard protocol following the bolus administration of intravenous contrast.  CONTRAST: 75 mL Isovue 370.  COMPARISON: None.  FINDINGS: Pharynx and larynx: No focal mucosal or submucosal lesions are present. The palatine tonsils are enlarged bilaterally. Soft palate is enlarged. No significant adenoid tissue is present. Lingual tonsils are prominent. The parapharyngeal fat is clear. The epiglottis is normal bilaterally. Vocal cords are midline and symmetric.  Salivary glands: Intraparotid lymph nodes are present in the parotid glands bilaterally. There is a intraparotid lipoma on the right which measures 2 cm. The submandibular glands are within normal limits bilaterally.  Thyroid: Asymmetric right-sided thyroid goiter is present without a discrete mass.  Lymph nodes: No significant cervical adenopathy is present.  Vascular: Atherosclerotic calcifications are present at carotid bifurcations bilaterally. Additional calcifications are present  at the aorta. No definite stenosis is present. The study is mildly degraded by patient motion.  Limited intracranial: Negative.  Visualized orbits: Unremarkable.  Mastoids and visualized paranasal sinuses: The paranasal sinuses are clear. There is fluid in the inferior mastoid air cells. No obstructing nasopharyngeal lesion is present  Skeleton: Laminectomies are noted at C4, C5, and C6. Osseous foraminal narrowing is present bilaterally at C3-4, C4-5, C5-6, and C6-7. There is reversal of the normal cervical lordosis. No focal lytic or blastic lesions are present.  Upper chest: Ill-defined airspace disease or mass lesion posteriorly in the left upper lobe measures at least 3.1 cm.  Other: A well-defined peripherally enhancing low-density subcutaneous collection is present in the posterior left neck at the C3-4 level. There is overlying skin thickening. Surrounding inflammatory changes are present.  IMPRESSION: 1. 3 cm subcutaneous abscess versus infected sebaceous cyst in the posterior left neck. 2. Overlying skin thickening reflecting edema or cellulitis. 3. Bilateral tonsillar hypertrophy. Question acute pharyngitis. 4. Atherosclerosis. 5. Postsurgical changes of the cervical spine with exaggerated kyphosis and residual foraminal stenosis bilaterally.   Electronically Signed By: San Morelle M.D. On: 02/17/2017 14:58    CLINICAL DATA: Chest pain over the last 3 hours, sedentary patient in wheelchair, smoking history  EXAM: CT ANGIOGRAPHY CHEST WITH CONTRAST  TECHNIQUE: Multidetector CT imaging of the chest was performed using the standard protocol during bolus  administration of intravenous contrast. Multiplanar CT image reconstructions and MIPs were obtained to evaluate the vascular anatomy.  CONTRAST: 100 cc Isovue 370  COMPARISON: Chest x-ray of 06/13/2017  FINDINGS: Cardiovascular: Opacification of the pulmonary arteries is not optimal and there  is some motion at the lung bases obscuring detail. However no significant central pulmonary embolism is seen. The heart is mildly enlarged. No pericardial effusion is noted. Coronary artery calcifications are present diffusely. The mid ascending thoracic aorta measures 35 mm in diameter.  Mediastinum/Nodes: No mediastinal or hilar adenopathy is seen. Only small mediastinal nodes are present none larger than 13 mm. There is asymmetric enlargement of the right lobe of thyroid which may indicate asymmetric goiter. No low-attenuation nodule is evident. The esophagus is unremarkable.  Lungs/Pleura: There is parenchymal opacity within the posterior left upper low near the apex. Some air bronchograms due course through this opacity and this is most consistent with pneumonia. On the lateral view there is suspicion of a central solid component however and follow-up to ensure clearing is recommended and exclude underlying neoplasm. The remainder of the lungs are well aerated. No pleural effusion is seen. The central airway is patent.  Upper Abdomen: The liver is unremarkable. Multiple gallstones layer dependently within the gallbladder and the gallbladder wall is not thickened. There appears to be a left adrenal adenoma present.  Musculoskeletal: The thoracic vertebrae are in normal alignment with some degenerative change in the mid to lower thoracic spine.  Review of the MIP images confirms the above findings.  IMPRESSION: 1. No evidence of pulmonary embolism although opacification of the pulmonary arteries is not optimal and there is some pulmonary motion in the lung bases. No central embolus is evident. 2. Parenchymal opacity in the posterior left upper lobe near the apex. This may represent pneumonia but with a somewhat solid central component, follow-up is recommended to exclude underlying neoplasm. 3. Coronary artery disease.   Electronically Signed By: Ivar Drape M.D. On:  06/13/2017 10:30  CLINICAL DATA: Opacity in the left upper lobe with possible solid component on prior CT, follow-up  EXAM: CT CHEST WITH CONTRAST  TECHNIQUE: Multidetector CT imaging of the chest was performed during intravenous contrast administration.  CONTRAST: 60 cc Isovue 370  COMPARISON: CT chest angio of 06/13/2016  FINDINGS: Cardiovascular: Diffuse coronary artery calcifications are present. The heart is within upper limits of normal. No pericardial effusion is seen. Moderate pericardial as well as epicardial fat is present. The pulmonary arteries are unremarkable, being well opacified. Moderate thoracic aortic atherosclerosis is noted. The mid ascending thoracic aorta measures 34 mm in diameter.  Mediastinum/Nodes: There are only small mediastinal lymph nodes present. A superior mediastinal lymph node on the right on image 38 series 2 measures 7 mm. A precarinal node on image 49 measures 7 mm. No definite mediastinal or hilar adenopathy is seen. As noted previously there appears to be an asymmetric thyroid goiter present right lobe larger than left with some nodularity.  Lungs/Pleura: The ground-glass opacity within the posterior left upper lobe bone in the pleura still appears to contain part solid central portion, and has not changed significantly in configuration. This lesion measures 2.8 x 3.6 cm.  Adenocarcinoma is considered. Thoracic surgery consultation is recommended. PET/CT should be considered for staging purposes.  These recommendations are taken from:  Recommendations for the Management of Subsolid Pulmonary Nodules Detected at CT: A Statement from the [facility]  [facility] 2013; 266:1, [phone].  There are changes of centrilobular emphysema noted primarily in  the upper lobes. No additional lung infiltrate or ground-glass opacity is seen. No pleural effusion is noted. The central airway is patent.  Upper Abdomen: Within the upper abdomen  no hepatic abnormality is noted. In the subcutaneous soft tissues of the midline slightly to the right there is and irregular soft tissue opacity of 21 mm in diameter on image 145. A smaller subcutaneous opacity is noted superficially on image 1 CT 6. These are of questionable significance but could be due to trauma with bruising. There are small gallstones within the slightly contracted gallbladder, as noted previously. Incidental adrenal adenomas again are noted.  Musculoskeletal: There are degenerative changes in the lower thoracic spine. No acute compression deformity or lytic or blastic lesion is seen.  IMPRESSION: 1. No interval change in the rounded gland ground-glass opacity within the posterior left upper lobe with some central solid component. This is worrisome for adenocarcinoma. A thoracic surgery consultation is recommended. PET/CT should be considered for staging purposes.  These recommendations are taken from: Recommendations for the Management of Subsolid Pulmonary Nodules Detected at CT: A Statement from the [facility] 2013; 266:1, [phone]. Diffuse coronary artery calcifications and moderate thoracic aortic atherosclerosis. 2. Asymmetric thyroid goiter. 3. Two subcutaneous soft tissue structures within the upper abdomen anteriorly as described above. Correlate clinically. 4. Gallstones within the gallbladder. 5.   Electronically Signed By: Ivar Drape M.D. On: 06/27/2017 15:05      Nm Pet Image Initial (pi) Skull Base To Thigh  Result Date: 07/18/2017 CLINICAL DATA:  Initial treatment strategy for left lung cancer. EXAM: NUCLEAR MEDICINE PET SKULL BASE TO THIGH TECHNIQUE: 10 mCi F-18 FDG was injected intravenously. Full-ring PET imaging was performed from the skull base to thigh after the radiotracer. CT data was obtained and used for attenuation correction and anatomic localization. Fasting blood glucose: 115 mg/dl COMPARISON:  CT chest  dated 06/27/2017 FINDINGS: Mediastinal blood pool activity: SUV max 3.0 NECK: No hypermetabolic cervical lymphadenopathy. Small bilateral parotid nodes. Focal hypermetabolism within the left parotid gland, max SUV 7.5, technically indeterminate but likely reflecting a benign lesion such as a Warthin's tumor. Incidental CT findings: none CHEST: 3.4 cm subsolid mass in the posterior left upper lobe (series 3/image 88), better evaluated on recent CT, max SUV 20.6, compatible with primary bronchogenic neoplasm such as invasive adenocarcinoma. No hypermetabolic thoracic lymphadenopathy. Incidental CT findings: Mild atherosclerotic calcifications of the aortic arch. Coronary atherosclerosis of the LAD and left coronary artery. ABDOMEN/PELVIS: No abnormal hypermetabolism in the liver, spleen, pancreas, or adrenal glands. No hypermetabolic abdominopelvic lymphadenopathy. Incidental CT findings: Status post cholecystectomy. 2.1 cm right lower pole renal cyst. Atherosclerotic calcifications the abdominal aorta and branch vessels. Small fat containing bilateral inguinal hernias. SKELETON: No focal hypermetabolic activity to suggest skeletal metastasis. Incidental CT findings: Degenerative changes of the visualized thoracolumbar spine. IMPRESSION: 3.4 cm subsolid mass in the posterior left upper lobe, compatible primary bronchogenic neoplasm such as invasive adenocarcinoma. No evidence of metastatic disease. 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 Electronically Signed   By: Julian Hy M.D.   On: 07/18/2017 09:01     I have independently reviewed the above radiology studies  and reviewed the findings with the patient.   Recent Lab Findings: No results found for: WBC, HGB, HCT, PLT, GLUCOSE, CHOL, TRIG, HDL, LDLDIRECT, LDLCALC, ALT, AST, NA, K, CL, CREATININE, BUN, CO2, TSH, INR, GLUF, HGBA1C  [place], [place]. [id number] Transthoracic Echocardiogram Report  Name: JONTA, GASTINEAU Study Date: 06/15/2017 Height: 74 in MRN: [id number] Patient Location: R707 Weight: 332 lb DOB: 03/29/1950 Gender: Male BSA: 2.7 m2 Age: 81 yrsEthnicity: Caucasian BP: 135/82 mmHg Reason For Study: Transaminitis HR: 78  Ordering Physician: [id number] Valaria Good Performed By: Kittie Plater Referring Physician: Sandi Mariscal - -  PROCEDURE Study Quality: Technically difficult. A injection of Optison contrast agent  was performed to improve image quality. Contrast approved by: Pu. Injection  of agitated saline contrast performed to evaluate for possible shunt. - SUMMARY The left ventricular size is normal. Mild left ventricular hypertrophy  Left ventricular systolic function is normal. LV ejection fraction = 60-65%. The right ventricle is normal in size and function. The left atrium is mildly dilated. The right atrium is mildly dilated. No significant stenosis or regurgitation seen There was insufficient TR detected to calculate RV systolic pressure. Estimated right atrial pressure is 5 mmHg.. Injection of agitated saline showed right-to-left interatrial shunt. There is no pericardial effusion. There is no comparison study available. - FINDINGS:  LEFT VENTRICLE The left ventricular size is normal. Mild left ventricular hypertrophy. Left  ventricular systolic function is normal. LV ejection fraction = 60-65%. Left  ventricular filling pattern is indeterminate. The left ventricular wall  motion is normal. -  RIGHT VENTRICLE The right ventricle is normal in size and function.  LEFT ATRIUM The left atrium is mildly dilated.  RIGHT ATRIUM The right atrium is mildly dilated. Injection of agitated saline showed  right-to-left interatrial shunt. - AORTIC VALVE Structurally normal aortic valve. There is no aortic  stenosis. There is no  aortic regurgitation. - MITRAL VALVE The mitral valve is normal in structure and function. There is no mitral  regurgitation noted. - TRICUSPID VALVE Structurally normal tricuspid valve. There is trace tricuspid regurgitation.  There was insufficient TR detected to calculate RV systolic pressure.  Estimated right atrial pressure is 5 mmHg.. - PULMONIC VALVE The pulmonic valve is not well visualized. Trace pulmonic valvular  regurgitation. - ARTERIES The aortic root is not well visualized but is probably normal size. - VENOUS Pulmonary venous flow pattern not well visualized. The IVC is normal in size  with an inspiratory collapse of greater then 50%, suggesting normal right  atrial pressure. - EFFUSION There is no pericardial effusion. - - MMode/2D Measurements & Calculations IVSd: 1.2 cm LVIDd: 4.6 cm LVPWd: 1.2 cm LVIDs: 3.4 cm LA diam: 4.4 cm Ao root: 3.4 cm EDV(MOD-sp4): 174.4 ml ESV(MOD-sp4): 56.3 ml EDV(MOD-sp2): 200.4 ml ESV(MOD-sp2): 96.8 ml LVOT diam: 2.6 cm SV(MOD-sp4): 118.2 ml SI(MOD-sp4): 43.8 ml/m2 IVC 1: 1.9 cm LA area A2: 18.4 cm2 LA area A4: 23.7 cm2 RA area A4: 19.7 cm2 Doppler Measurements & Calculations MV E max vel: 74.4 cm/sec MV A max vel: 52.8 cm/sec MV E/A: 1.4 Med Peak E' Vel: 13.0 cm/sec Lat Peak E' Vel: 13.1 cm/sec E/Lat E`: 5.7 E/Med E`: 5.7 MV dec time: 0.21 sec SV(LVOT): 126.7 ml Ao V2 max: 145.1 cm/sec Ao max PG: 8.4 mmHg Ao V2 mean: 89.2 cm/sec Ao mean PG: 3.7 mmHg Ao V2 VTI: 28.2 cm AVA (VTI): 4.5 cm2 LV V1 VTI: 23.3 cm AS Dimensionless Index (VTI): 0.83 AVAi(VTI) cm^2/m^2: 1.7 cm2 SV index(LVOT): 47.0 ml/m2   Reading Physician: [place], [id number] 06/15/2017 04:58 PM  Assessment / Plan:   Slowly enlarging groundglass opacity hypermetabolic left upper lobe, first noted on CT scan of the neck in December 2018.  On review  3.2 cm left upper lobe lung masses highly suspicious for  malignancy-the patient on exam and by history has very limited pulmonary reserve, will obtain pulmonary function studies, to further evaluate the mass will obtain CT directed needle biopsy.  It appears that the patient has a clinical  stage Ib primary carcinoma of the lung.  After obtaining pulmonary function studies and a needle biopsy will make recommendations concerning surgical resection versus stereotactic radiotherapy.  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 Patient is a long-term limited mobility wheelchair confined primarily from a logging injury in 1989  Recent cholecystectomy for acute cholecystitis done at [facility]    I  spent 60 minutes with  the patient face to face and greater then 50% of the time was spent in counseling and coordination of care.    Grace Isaac MD      [address] [place] [address] Office [phone]   Beeper [phone]  07/26/2017 5:38 PM

## 2017-07-26 NOTE — Patient Instructions (Signed)
Pulmonary Nodule  A pulmonary nodule is a small, round growth of tissue in the lung. Pulmonary nodules can range in size from less than 1/5 inch (4 mm) to a little bigger than an inch (25 mm). Most pulmonary nodules are detected when imaging tests of the lung are being performed for a different problem. Pulmonary nodules are usually not cancerous (benign). However, some pulmonary nodules are cancerous (malignant). Follow-up treatment or testing is based on the size of the pulmonary nodule and your risk of getting lung cancer. What are the causes? Benign pulmonary nodules can be caused by various things. Some of the causes include:  Bacterial, fungal, or viral infections. This is usually an old infection that is no longer active, but it can sometimes be a current, active infection.  A benign mass of tissue.  Inflammation from conditions such as rheumatoid arthritis.  Abnormal blood vessels in the lungs.  Malignant pulmonary nodules can result from lung cancer or from cancers that spread to the lung from other places in the body. What are the signs or symptoms? Pulmonary nodules usually do not cause symptoms. How is this diagnosed? Most often, pulmonary nodules are found incidentally when an X-ray or CT scan is performed to look for some other problem in the lung area. To help determine whether a pulmonary nodule is benign or malignant, your health care provider will take a medical history and order a variety of tests. Tests done may include:  Blood tests.  A skin test called a tuberculin test. This test is used to determine if you have been exposed to the germ that causes tuberculosis.  Chest X-rays. If possible, a new X-ray may be compared with X-rays you have had in the past.  CT scan. This test shows smaller pulmonary nodules more clearly than an X-ray.  Positron emission tomography (PET) scan. In this test, a safe amount of a radioactive substance is injected into the bloodstream.  Then, the scan takes a picture of the pulmonary nodule. The radioactive substance is eliminated from your body in your urine.  Biopsy. A tiny piece of the pulmonary nodule is removed so it can be checked under a microscope.  How is this treated? Pulmonary nodules that are benign normally do not require any treatment because they usually do not cause symptoms or breathing problems. Your health care provider may want to monitor the pulmonary nodule through follow-up CT scans. The frequency of these CT scans will vary based on the size of the nodule and the risk factors for lung cancer. For example, CT scans will need to be done more frequently if the pulmonary nodule is larger and if you have a history of smoking and a family history of cancer. Further testing or biopsies may be done if any follow-up CT scan shows that the size of the pulmonary nodule has increased. Follow these instructions at home:  Only take over-the-counter or prescription medicines as directed by your health care provider.  Keep all follow-up appointments with your health care provider. Contact a health care provider if:  You have trouble breathing when you are active.  You feel sick or unusually tired.  You do not feel like eating.  You lose weight without trying to.  You develop chills or night sweats. Get help right away if:  You cannot catch your breath, or you begin wheezing.  You cannot stop coughing.  You cough up blood.  You become dizzy or feel like you are going to pass out.  You have sudden chest pain.  You have a fever or persistent symptoms for more than 2-3 days.  You have a fever and your symptoms suddenly get worse. This information is not intended to replace advice given to you by your health care provider. Make sure you discuss any questions you have with your health care provider. Document Released: 12/19/2008 Document Revised: 07/30/2015 Document Reviewed: 08/13/2012 Elsevier Interactive  Patient Education  2017 [employer] Lung cancer occurs when abnormal cells in the lung grow out of control and form a mass (tumor). There are several types of lung cancer. The two most common types are:  Non-small cell. In this type of lung cancer, abnormal cells are larger and grow more slowly than those of small cell lung cancer.  Small cell. In this type of lung cancer, abnormal cells are smaller than those of non-small cell lung cancer. Small cell lung cancer gets worse faster than non-small cell lung cancer.  What are the causes? The leading cause of lung cancer is smoking tobacco. The second leading cause is radon exposure. What increases the risk?  Smoking tobacco.  Exposure to secondhand tobacco smoke.  Exposure to radon gas.  Exposure to asbestos.  Exposure to arsenic in drinking water.  Air pollution.  Family or personal history of lung cancer.  Lung radiation therapy.  Being older than 78 years. What are the signs or symptoms? In the early stages, symptoms may not be present. As the cancer progresses, symptoms may include:  A lasting cough, possibly with blood.  Fatigue.  Unexplained weight loss.  Shortness of breath.  Wheezing.  Chest pain.  Loss of appetite.  Symptoms of advanced lung cancer include:  Hoarseness.  Bone or joint pain.  Weakness.  Nail problems.  Face or arm swelling.  Paralysis of the face.  Drooping eyelids.  How is this diagnosed? Lung cancer can be identified with a physical exam and with tests such as:  A chest X-ray.  A CT scan.  Blood tests.  A biopsy.  After a diagnosis is made, you will have more tests to determine the stage of the cancer. The stages of non-small cell lung cancer are:  Stage 0, also called carcinoma in situ. At this stage, abnormal cells are found in the inner lining of your lung or lungs.  Stage I. At this stage, abnormal cells have grown into a tumor that is no  larger than 5 cm across. The cancer has entered the deeper lung tissue but has not yet entered the lymph nodes or other parts of the body.  Stage II. At this stage, the tumor is 7 cm across or smaller and has entered nearby lymph nodes. Or, the tumor is 5 cm across or smaller and has invaded surrounding tissue but is not found in nearby lymph nodes. There may be more than one tumor present.  Stage III. At this stage, the tumor may be any size. There may be more than one tumor in the lungs. The cancer cells have spread to the lymph nodes and possibly to other organs.  Stage IV. At this stage, there are tumors in both lungs and the cancer has spread to other areas of the body.  The stages of small cell lung cancer are:  Limited. At this stage, the cancer is found only on one side of the chest.  Extensive. At this stage, the cancer is in the lungs and in tissues on the other side of the chest.  The cancer has spread to other organs or is found in the fluid between the layers of your lungs.  How is this treated? Depending on the type and stage of your lung cancer, you may be treated with:  Surgery. This is done to remove a tumor.  Radiation therapy. This treatment destroys cancer cells using X-rays or other types of radiation.  Chemotherapy. This treatment uses medicines to destroy cancer cells.  Targeted therapy. This treatment aims to destroy only cancer cells instead of all cells as other therapies do.  You may also have a combination of treatments. Follow these instructions at home:  Do not use any tobacco products. This includes cigarettes, chewing tobacco, and electronic cigarettes. If you need help quitting, ask your health care provider.  Take medicines only as directed by your health care provider.  Eat a healthy diet. Work with a dietitian to make sure you are getting the nutrition you need.  Consider joining a support group or seeking counseling to help you cope with the  stress of having lung cancer.  Let your cancer specialist (oncologist) know if you are admitted to the hospital.  Keep all follow-up visits as directed by your health care provider. This is important. Contact a health care provider if:  You lose weight without trying.  You have a persistent cough and wheezing.  You feel short of breath.  You tire easily.  You experience bone or joint pain.  You have difficulty swallowing.  You feel hoarse or notice your voice changing.  Your pain medicine is not helping. Get help right away if:  You cough up blood.  You have new breathing problems.  You develop chest pain.  You develop swelling in: ? One or both ankles or legs. ? Your face, neck, or arms.  You are confused.  You experience paralysis in your face or a drooping eyelid. This information is not intended to replace advice given to you by your health care provider. Make sure you discuss any questions you have with your health care provider. Document Released: 05/30/2000 Document Revised: 07/30/2015 Document Reviewed: 06/27/2013 Elsevier Interactive Patient Education  [employer].

## 2017-07-27 ENCOUNTER — Other Ambulatory Visit: Payer: Self-pay | Admitting: *Deleted

## 2017-07-27 DIAGNOSIS — C3412 Malignant neoplasm of upper lobe, left bronchus or lung: Secondary | ICD-10-CM

## 2017-08-03 ENCOUNTER — Ambulatory Visit (HOSPITAL_COMMUNITY)
Admission: RE | Admit: 2017-08-03 | Discharge: 2017-08-03 | Disposition: A | Discharge status: Home / Self Care | Payer: Medicare Other | Source: Ambulatory Visit | Attending: Cardiothoracic Surgery | Admitting: Cardiothoracic Surgery

## 2017-08-03 DIAGNOSIS — C3412 Malignant neoplasm of upper lobe, left bronchus or lung: Secondary | ICD-10-CM | POA: Insufficient documentation

## 2017-08-03 LAB — PULMONARY FUNCTION TEST
DL/VA % pred: 86 %
DL/VA: 4.15 ml/min/mmHg/L
DLCO unc % pred: 86 %
DLCO unc: 32.5 ml/min/mmHg
FEF 25-75 Post: 2.64 L/sec
FEF 25-75 Pre: 2.57 L/sec
FEF2575-%Change-Post: 2 %
FEF2575-%Pred-Post: 84 %
FEF2575-%Pred-Pre: 82 %
FEV1-%Change-Post: 0 %
FEV1-%Pred-Post: 85 %
FEV1-%Pred-Pre: 84 %
FEV1-Post: 3.38 L
FEV1-Pre: 3.35 L
FEV1FVC-%Change-Post: 5 %
FEV1FVC-%Pred-Pre: 96 %
FEV6-%Change-Post: -3 %
FEV6-%Pred-Post: 87 %
FEV6-%Pred-Pre: 90 %
FEV6-Post: 4.4 L
FEV6-Pre: 4.56 L
FEV6FVC-%Change-Post: 0 %
FEV6FVC-%Pred-Post: 104 %
FEV6FVC-%Pred-Pre: 103 %
FVC-%Change-Post: -4 %
FVC-%Pred-Post: 83 %
FVC-%Pred-Pre: 87 %
FVC-Post: 4.42 L
FVC-Pre: 4.62 L
Post FEV1/FVC ratio: 76 %
Post FEV6/FVC ratio: 99 %
Pre FEV1/FVC ratio: 73 %
Pre FEV6/FVC Ratio: 99 %
RV % pred: 154 %
RV: 3.97 L
TLC % pred: 110 %
TLC: 8.61 L

## 2017-08-03 MED ORDER — ALBUTEROL SULFATE (2.5 MG/3ML) 0.083% IN NEBU
2.5000 mg | INHALATION_SOLUTION | Freq: Once | RESPIRATORY_TRACT | Status: AC
  Administered 2017-08-03: 2.5 mg via RESPIRATORY_TRACT

## 2017-08-04 DIAGNOSIS — M5002 Cervical disc disorder with myelopathy, mid-cervical region, unspecified level: Secondary | ICD-10-CM | POA: Diagnosis not present

## 2017-08-09 ENCOUNTER — Other Ambulatory Visit: Payer: Self-pay | Admitting: Radiology

## 2017-08-10 ENCOUNTER — Ambulatory Visit (HOSPITAL_COMMUNITY)
Admission: RE | Admit: 2017-08-10 | Discharge: 2017-08-10 | Disposition: A | Discharge status: Home / Self Care | Payer: Medicare Other | Source: Ambulatory Visit | Attending: Diagnostic Radiology | Admitting: Diagnostic Radiology

## 2017-08-10 ENCOUNTER — Ambulatory Visit (HOSPITAL_COMMUNITY)
Admission: RE | Admit: 2017-08-10 | Discharge: 2017-08-10 | Disposition: A | Discharge status: Home / Self Care | Payer: Medicare Other | Source: Ambulatory Visit | Attending: Cardiothoracic Surgery | Admitting: Cardiothoracic Surgery

## 2017-08-10 ENCOUNTER — Encounter (HOSPITAL_COMMUNITY): Payer: Self-pay

## 2017-08-10 DIAGNOSIS — R918 Other nonspecific abnormal finding of lung field: Secondary | ICD-10-CM | POA: Diagnosis not present

## 2017-08-10 DIAGNOSIS — Z9889 Other specified postprocedural states: Secondary | ICD-10-CM

## 2017-08-10 DIAGNOSIS — F17210 Nicotine dependence, cigarettes, uncomplicated: Secondary | ICD-10-CM | POA: Insufficient documentation

## 2017-08-10 DIAGNOSIS — I10 Essential (primary) hypertension: Secondary | ICD-10-CM | POA: Diagnosis not present

## 2017-08-10 DIAGNOSIS — R911 Solitary pulmonary nodule: Secondary | ICD-10-CM | POA: Diagnosis not present

## 2017-08-10 DIAGNOSIS — C3412 Malignant neoplasm of upper lobe, left bronchus or lung: Secondary | ICD-10-CM

## 2017-08-10 HISTORY — DX: Essential (primary) hypertension: I10

## 2017-08-10 LAB — CBC
HCT: 49 % (ref 39.0–52.0)
Hemoglobin: 16.5 g/dL (ref 13.0–17.0)
MCH: 29.9 pg (ref 26.0–34.0)
MCHC: 33.7 g/dL (ref 30.0–36.0)
MCV: 88.8 fL (ref 78.0–100.0)
Platelets: 240 10*3/uL (ref 150–400)
RBC: 5.52 MIL/uL (ref 4.22–5.81)
RDW: 14.2 % (ref 11.5–15.5)
WBC: 10.3 10*3/uL (ref 4.0–10.5)

## 2017-08-10 LAB — PROTIME-INR
INR: 0.99
PROTHROMBIN TIME: 13 s (ref 11.4–15.2)

## 2017-08-10 MED ORDER — FENTANYL CITRATE (PF) 100 MCG/2ML IJ SOLN
INTRAMUSCULAR | Status: AC | PRN
  Administered 2017-08-10: 50 ug via INTRAVENOUS

## 2017-08-10 MED ORDER — MIDAZOLAM HCL 2 MG/2ML IJ SOLN
INTRAMUSCULAR | Status: AC
  Filled 2017-08-10: qty 4

## 2017-08-10 MED ORDER — MIDAZOLAM HCL 2 MG/2ML IJ SOLN
INTRAMUSCULAR | Status: AC | PRN
  Administered 2017-08-10: 1 mg via INTRAVENOUS

## 2017-08-10 MED ORDER — SODIUM CHLORIDE 0.9 % IV SOLN: INTRAVENOUS | Status: DC

## 2017-08-10 MED ORDER — FENTANYL CITRATE (PF) 100 MCG/2ML IJ SOLN
INTRAMUSCULAR | Status: AC
  Filled 2017-08-10: qty 4

## 2017-08-10 MED ORDER — LIDOCAINE HCL 1 % IJ SOLN
INTRAMUSCULAR | Status: AC
  Filled 2017-08-10: qty 20

## 2017-08-10 MED ORDER — HYDROCODONE-ACETAMINOPHEN 5-325 MG PO TABS
1.0000 | ORAL_TABLET | ORAL | Status: DC | PRN
  Filled 2017-08-10: qty 2

## 2017-08-10 NOTE — Discharge Instructions (Signed)
Lung Biopsy, Care After °This sheet gives you information about how to care for yourself after your procedure. Your health care provider may also give you more specific instructions depending on the type of biopsy you had. If you have problems or questions, contact your health care provider. °What can I expect after the procedure? °After the procedure, it is common to have: °· A cough. °· A sore throat. °· Pain where a needle, bronchoscope, or incision was used to collect a biopsy sample (biopsy site). ° °Follow these instructions at home: °Medicines °· Take over-the-counter and prescription medicines only as told by your health care provider. °· Do not drive for 24 hours if you were given a sedative. °· Do not drink alcohol while taking pain medicine. °· Do not drive or use heavy machinery while taking prescription pain medicine. °· To prevent or treat constipation while you are taking prescription pain medicine, your health care provider may recommend that you: °? Drink enough fluid to keep your urine clear or pale yellow. °? Take over-the-counter or prescription medicines. °? Eat foods that are high in fiber, such as fresh fruits and vegetables, whole grains, and beans. °? Limit foods that are high in fat and processed sugars, such as fried and sweet foods. °Activity °· If you had an incision during your procedure, avoid activities that may pull the incision site open. °· Return to your normal activities as told by your health care provider. Ask your health care provider what activities are safe for you. °If you had an open biopsy:  °· Follow instructions from your health care provider about how to take care of your incision. Make sure you: °? Wash your hands with soap and water before you change your bandage (dressing). If soap and water are not available, use hand sanitizer. °? Change your dressing as told by your health care provider. °? Leave stitches (sutures), skin glue, or adhesive strips in place. These  skin closures may need to stay in place for 2 weeks or longer. If adhesive strip edges start to loosen and curl up, you may trim the loose edges. Do not remove adhesive strips completely unless your health care provider tells you to do that. °· Check your incision area every day for signs of infection. Check for: °? Redness, swelling, or pain. °? Fluid or blood. °? Warmth. °? Pus or a bad smell. °General instructions °· It is up to you to get the results of your procedure. Ask your health care provider, or the department that is doing the procedure, when your results will be ready. °Contact a health care provider if: °· You have a fever. °· You have redness, swelling, or pain around your biopsy site. °· You have fluid or blood coming from your biopsy site. °· Your biopsy site feels warm to the touch. °· You have pus or a bad smell coming from your biopsy site. °Get help right away if: °· You cough up blood. °· You have trouble breathing. °· You have chest pain. °Summary °· After the procedure, it is common to have a sore throat and a cough. °· Return to your normal activities as told by your health care provider. Ask your health care provider what activities are safe for you. °· Take over-the-counter and prescription medicines only as told by your health care provider. °· Report any unusual symptoms to your health care provider. °This information is not intended to replace advice given to you by your health care provider. Make sure   you discuss any questions you have with your health care provider. Document Released: 03/22/2016 Document Revised: 03/22/2016 Document Reviewed: 03/22/2016 Elsevier Interactive Patient Education  2018 [employer].  Moderate Conscious Sedation, Adult, Care After These instructions provide you with information about caring for yourself after your procedure. Your health care provider may also give you more specific instructions. Your treatment has been planned according to current  medical practices, but problems sometimes occur. Call your health care provider if you have any problems or questions after your procedure. What can I expect after the procedure? After your procedure, it is common:  To feel sleepy for several hours.  To feel clumsy and have poor balance for several hours.  To have poor judgment for several hours.  To vomit if you eat too soon.  Follow these instructions at home: For at least 24 hours after the procedure:   Do not: ? Participate in activities where you could fall or become injured. ? Drive. ? Use heavy machinery. ? Drink alcohol. ? Take sleeping pills or medicines that cause drowsiness. ? Make important decisions or sign legal documents. ? Take care of children on your own.  Rest. Eating and drinking  Follow the diet recommended by your health care provider.  If you vomit: ? Drink water, juice, or soup when you can drink without vomiting. ? Make sure you have little or no nausea before eating solid foods. General instructions  Have a responsible adult stay with you until you are awake and alert.  Take over-the-counter and prescription medicines only as told by your health care provider.  If you smoke, do not smoke without supervision.  Keep all follow-up visits as told by your health care provider. This is important. Contact a health care provider if:  You keep feeling nauseous or you keep vomiting.  You feel light-headed.  You develop a rash.  You have a fever. Get help right away if:  You have trouble breathing. This information is not intended to replace advice given to you by your health care provider. Make sure you discuss any questions you have with your health care provider. Document Released: 12/12/2012 Document Revised: 07/27/2015 Document Reviewed: 06/13/2015 Elsevier Interactive Patient Education  [employer].

## 2017-08-10 NOTE — Procedures (Signed)
CT guided core biopsy of left upper lobe lesion. 2 cores obtained.  Minimal blood loss and no immediate complication. CXR in one hour.

## 2017-08-10 NOTE — Sedation Documentation (Signed)
Patient is breathing through mouth. CO2 reading are erroneous and intermittent

## 2017-08-10 NOTE — H&P (Signed)
Chief Complaint: Patient was seen in consultation today for left lung mass biopsy at the request of Gerhardt,Edward B  Referring Physician(s): Grace Isaac  Supervising Physician: Markus Daft  Patient Status: [facility] - Out-pt  History of Present Illness: Reginald Berry is a 67 y.o. male   ++ smoker CP weeks ago and went to [facility]-- was sent to [facility] for evaluation Cholecystectomy done during admission While work up-- CT for PE; PE Neg but new left lung lesion  +PET 07/17/17:  IMPRESSION: 3.4 cm subsolid mass in the posterior left upper lobe, compatible primary bronchogenic neoplasm such as invasive adenocarcinoma. No evidence of metastatic disease. 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  Dr Servando Snare note 07/26/17: Assessment / Plan:   Slowly enlarging groundglass opacity hypermetabolic left upper lobe, first noted on CT scan of the neck in December 2018.  On review 3.2 cm left upper lobe lung masses highly suspicious for malignancy-the patient on exam and by history has very limited pulmonary reserve, will obtain pulmonary function studies, to further evaluate the mass will obtain CT directed needle biopsy.  It appears that the patient has a clinical  stage Ib primary carcinoma of the lung.  After obtaining pulmonary function studies and a needle biopsy will make recommendations concerning surgical resection versus stereotactic radiotherapy.  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 Hypertension     History reviewed. No pertinent  surgical history.  Allergies: Patient has no known allergies.  Medications: Prior to Admission medications   Medication Sig Start Date End Date Taking? Authorizing Provider  amLODipine (NORVASC) 10 MG tablet Take 10 mg by mouth daily.   Yes [provider]  buPROPion (WELLBUTRIN XL) 150 MG 24 hr tablet Take 150 mg by mouth daily.   Yes [provider]  cetirizine (ZYRTEC) 10 MG tablet Take 10 mg by mouth daily as needed for allergies.   Yes [provider]  furosemide (LASIX) 40 MG tablet Take 40 mg by mouth daily.    Yes [provider]  hydrochlorothiazide (MICROZIDE) 12.5 MG capsule Take 12.5 mg by mouth daily.   Yes [provider]  Multiple Vitamins-Minerals (CENTRUM SILVER 50+MEN) TABS Take 1 tablet by mouth daily.   Yes [provider]  oxyCODONE-acetaminophen (PERCOCET) 10-325 MG tablet Take 1 tablet by mouth every 4 (four) hours as needed for pain.   Yes [provider]  pantoprazole (PROTONIX) 40 MG tablet Take 40 mg by mouth daily.   Yes [provider]  Probiotic Product (PROBIOTIC DAILY PO) Take 1 capsule by mouth daily.   Yes [provider]  sildenafil (VIAGRA) 100 MG tablet Take 100 mg by mouth daily as needed for erectile dysfunction.    [provider]     History reviewed. No pertinent family history.  Social History   Socioeconomic History  . Marital status: Single    Spouse name: Not on file  . Number of children: Not on file  . Years of education: Not on file  . Highest education level: Not on file  Occupational  History  . Not on file  Social Needs  . Financial resource strain: Not on file  . Food insecurity:    Worry: Not on file    Inability: Not on file  . Transportation needs:    Medical: Not on file    Non-medical: Not on file  Tobacco Use  . Smoking status: Current Every Day Smoker    Packs/day: 0.50  . Smokeless tobacco: Never Used  . Tobacco comment: 3 a  day  Substance and Sexual Activity  . Alcohol use: Not on file  . Drug use: Not on file  . Sexual activity: Not on file  Lifestyle  . Physical activity:    Days per week: Not on file    Minutes per session: Not on file  . Stress: Not on file  Relationships  . Social connections:    Talks on phone: Not on file    Gets together: Not on file    Attends religious service: Not on file    Active member of club or organization: Not on file    Attends meetings of clubs or organizations: Not on file    Relationship status: Not on file  Other Topics Concern  . Not on file  Social History Narrative  . Not on file    Review of Systems: A 12 point ROS discussed and pertinent positives are indicated in the HPI above.  All other systems are negative.  Review of Systems  Constitutional: Positive for fatigue. Negative for activity change and fever.  Respiratory: Positive for cough and shortness of breath.   Skin: Positive for rash.  Neurological: Negative for weakness.  Psychiatric/Behavioral: Negative for behavioral problems and confusion.    Vital Signs: BP 136/74   Pulse 89   Temp 98.5 F (36.9 C)   Resp 20   Ht 6\' 2"  (1.88 m)   Wt (!) 320 lb (145.2 kg)   SpO2 96%   BMI 41.09 kg/m   Physical Exam  Constitutional: He is oriented to person, place, and time.  Cardiovascular: Normal rate, regular rhythm and normal heart sounds.  Pulmonary/Chest: Effort normal and breath sounds normal. He has no wheezes.  Abdominal: Soft. Bowel sounds are normal.  Musculoskeletal: Normal range of motion.  Neurological: He is alert and oriented to person, place, and time.  Skin: Rash noted.  Excoriated lesions B arms Pt picks at areas secondary "nervousness"  Nursing note and vitals reviewed.   Imaging: Nm Pet Image Initial (pi) Skull Base To Thigh  Result Date: 07/18/2017 CLINICAL DATA:  Initial treatment strategy for left lung cancer. EXAM: NUCLEAR MEDICINE PET SKULL BASE TO THIGH  TECHNIQUE: 10 mCi F-18 FDG was injected intravenously. Full-ring PET imaging was performed from the skull base to thigh after the radiotracer. CT data was obtained and used for attenuation correction and anatomic localization. Fasting blood glucose: 115 mg/dl COMPARISON:  CT chest dated 06/27/2017 FINDINGS: Mediastinal blood pool activity: SUV max 3.0 NECK: No hypermetabolic cervical lymphadenopathy. Small bilateral parotid nodes. Focal hypermetabolism within the left parotid gland, max SUV 7.5, technically indeterminate but likely reflecting a benign lesion such as a Warthin's tumor. Incidental CT findings: none CHEST: 3.4 cm subsolid mass in the posterior left upper lobe (series 3/image 88), better evaluated on recent CT, max SUV 20.6, compatible with primary bronchogenic neoplasm such as invasive adenocarcinoma. No hypermetabolic thoracic lymphadenopathy. Incidental CT findings: Mild atherosclerotic calcifications of the aortic arch. Coronary atherosclerosis of the LAD and left coronary artery. ABDOMEN/PELVIS: No abnormal hypermetabolism  in the liver, spleen, pancreas, or adrenal glands. No hypermetabolic abdominopelvic lymphadenopathy. Incidental CT findings: Status post cholecystectomy. 2.1 cm right lower pole renal cyst. Atherosclerotic calcifications the abdominal aorta and branch vessels. Small fat containing bilateral inguinal hernias. SKELETON: No focal hypermetabolic activity to suggest skeletal metastasis. Incidental CT findings: Degenerative changes of the visualized thoracolumbar spine. IMPRESSION: 3.4 cm subsolid mass in the posterior left upper lobe, compatible primary bronchogenic neoplasm such as invasive adenocarcinoma. No evidence of metastatic disease. 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 Electronically Signed   By: Julian Hy M.D.   On: 07/18/2017 09:01    Labs:  CBC: No  results for input(s): WBC, HGB, HCT, PLT in the last 8760 hours.  COAGS: No results for input(s): INR, APTT in the last 8760 hours.  BMP: No results for input(s): NA, K, CL, CO2, GLUCOSE, BUN, CALCIUM, CREATININE, GFRNONAA, GFRAA in the last 8760 hours.  Invalid input(s): CMP  LIVER FUNCTION TESTS: No results for input(s): BILITOT, AST, ALT, ALKPHOS, PROT, ALBUMIN in the last 8760 hours.  TUMOR MARKERS: No results for input(s): AFPTM, CEA, CA199, CHROMGRNA in the last 8760 hours.  Assessment and Plan:  Left lung mass +PET ++ smoker Request for lung lesion bx per Dr Servando Snare  Risks and benefits discussed with the patient including, but not limited to bleeding, hemoptysis, respiratory failure requiring intubation, infection, pneumothorax requiring chest tube placement, stroke from air embolism or even death.  All of the patient's questions were answered, patient is agreeable to proceed. Consent signed and in chart.   Thank you for this interesting consult.  I greatly enjoyed meeting Reginald Berry and look forward to participating in their care.  A copy of this report was sent to the requesting provider on this date.  Electronically Signed: Lavonia Drafts, PA-C 08/10/2017, 9:59 AM   I spent a total of  30 Minutes   in face to face in clinical consultation, greater than 50% of which was counseling/coordinating care for left lung lesion bx

## 2017-08-14 NOTE — Progress Notes (Signed)
Thoracic Location of Tumor / Histology: Mass of Left upper lobe  Patient presented with heart pains that were evaluated in the emergency room.  He was ultimately transferred to [facility].  While at [facility] her was found to have a gangrenous gallbladder, he underwent a cholecystectomy.  CT Neck 02/17/2017: 1. 3 cm subcutaneous abscess versus infected sebaceous cyst in the posterior left neck. 2. Overlying skin thickening reflecting edema or cellulitis. 3. Bilateral tonsillar hypertrophy. Question acute pharyngitis. 4. Atherosclerosis. 5. Postsurgical changes of the cervical spine with exaggerated kyphosis and residual foraminal stenosis bilaterally.  CTA chest 06/13/2017: 1. No evidence of pulmonary embolism although opacification of the pulmonary arteries is not optimal and there is some pulmonary motion in the lung bases. No central embolus is evident. 2. Parenchymal opacity in the posterior left upper lobe near the apex. This may represent pneumonia but with a somewhat solid central component, follow-up is recommended to exclude underlying neoplasm. 3. Coronary artery disease.  CT Chest 06/27/2017: 1. No interval change in the rounded gland ground-glass opacity within the posterior left upper lobe with some central solid component. This is worrisome for adenocarcinoma.  PET 07/18/2017: 3.4 cm subsolid mass in the posterior left upper lobe, compatible primary bronchogenic neoplasm such as invasive adenocarcinoma. No evidence of metastatic disease. 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  Biopsies of Left Upper Lobe 08/10/2017   Tobacco/Marijuana/Snuff/ETOH use: Current smoker, trying to quit.  Past/Anticipated interventions by cardiothoracic surgery, if any:  Dr. Servando Snare 07/26/2017 -On review 3.2 cm left upper lobe lung masses highly suspicious for malignancy. -The patient on exam  and by history has very limited pulmonary reserve, will obtain pulmonary function studies, to further evaluate the mass will obtain CT directed needle biopsy.   -It appears that the patient has a clinical  stage Ib primary carcinoma of the lung.   -After obtaining pulmonary function studies and a needle biopsy will make recommendations concerning surgical resection versus stereotactic radiotherapy.   Past/Anticipated interventions by medical oncology, if any:    Signs/Symptoms  Weight changes, if any: None  Respiratory complaints, if any:  SOB with activity.  Hemoptysis, if any: Occasional productive cough, clear/yellow phlegm.  Pain issues, if any:  Left side, underneath breast 2/10 pain.  Lower back pain taking percocet.  BP (!) 144/73   Pulse 88   Temp 98.1 F (36.7 C)   Resp 18   Wt (!) 342 lb 6.4 oz (155.3 kg)   SpO2 98%   BMI 43.96 kg/m    Wt Readings from Last 3 Encounters:  08/16/17 (!) 342 lb 6.4 oz (155.3 kg)  08/10/17 (!) 320 lb (145.2 kg)  07/26/17 (!) 320 lb (145.2 kg)    SAFETY ISSUES:  Prior radiation? No   Pacemaker/ICD?  No  Possible current pregnancy? No  Is the patient on methotrexate? 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

## 2017-08-16 ENCOUNTER — Ambulatory Visit
Admission: RE | Admit: 2017-08-16 | Discharge: 2017-08-16 | Disposition: A | Discharge status: Home / Self Care | Payer: Medicare Other | Source: Ambulatory Visit | Attending: Radiation Oncology | Admitting: Radiation Oncology

## 2017-08-16 ENCOUNTER — Encounter: Payer: Self-pay | Admitting: Radiation Oncology

## 2017-08-16 ENCOUNTER — Other Ambulatory Visit: Payer: Self-pay

## 2017-08-16 VITALS — BP 144/73 | HR 88 | Temp 98.1°F | Resp 18 | Wt 342.4 lb

## 2017-08-16 DIAGNOSIS — F17210 Nicotine dependence, cigarettes, uncomplicated: Secondary | ICD-10-CM | POA: Insufficient documentation

## 2017-08-16 DIAGNOSIS — R918 Other nonspecific abnormal finding of lung field: Secondary | ICD-10-CM

## 2017-08-16 DIAGNOSIS — C3412 Malignant neoplasm of upper lobe, left bronchus or lung: Secondary | ICD-10-CM | POA: Insufficient documentation

## 2017-08-16 DIAGNOSIS — Z79899 Other long term (current) drug therapy: Secondary | ICD-10-CM | POA: Insufficient documentation

## 2017-08-16 DIAGNOSIS — Z9049 Acquired absence of other specified parts of digestive tract: Secondary | ICD-10-CM | POA: Insufficient documentation

## 2017-08-16 DIAGNOSIS — I10 Essential (primary) hypertension: Secondary | ICD-10-CM | POA: Diagnosis not present

## 2017-08-16 NOTE — Progress Notes (Signed)
Radiation Oncology         [phone] ________________________________  Name: Reginald Berry        MRN: [id number]  Date of Service: 08/16/2017 DOB: 1950-11-15  UU:VOZDGU, Mitzie Na, MD  Grace Isaac, MD     REFERRING PHYSICIAN: Grace Isaac, MD   DIAGNOSIS: The encounter diagnosis was Mass of upper lobe of left lung.   HISTORY OF PRESENT ILLNESS: Reginald Berry is a 67 y.o. male seen at the request of Dr. Servando Snare for newly diagnosed left lung cancer. The patient presented to the ED in [place] on 06/13/17 with chest pain. The patient was ultimately transferred to [facility] with concerns of possible cardiac event. Chest x-ray was performed at this time revealing a nodular opacity in the left upper lung measuring 2.8 x 2.9 cm. Patchy right basilar opacities also noted. Mild interstitial pulmonary edema also present. The patient was also found on additional imaging to have a gangrenous gallbladder, and he underwent a cholecystectomy.   The patient proceeded with  CT of the chest on 06/27/17. This showed no interval change in the rounded gland ground-glass opacity within the posterior left upper lobe with some central solid component. PET scan on 07/17/17 showed a 3.4 cm sub-solid mass in the posterior left upper lobe, compatible for primary bronchogenic neoplasm such as invasive adenocarcinoma. There was no evidence of metastatic disease. There was focal hypermetabolism within the left parotid gland, technically indeterminate but likely reflecting a benign lesion such as Warthin's tumor.   The patient presented to Dr. Servando Snare on 07/26/17. Per his note, the 3.2 cm left upper lobe mass is highly suspicious for malignancy. He had PFTs performed at [facility] and was to proceed with a biopsy of this site which he underwent on 08/10/17. 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 While he may be a surgical candidate, he comes today to  discuss alternative options if he is not appropriate for surgery per Dr. Servando Snare.   PREVIOUS RADIATION THERAPY: No   PAST MEDICAL HISTORY:  Past Medical History:  Diagnosis Date  . Hypertension        PAST SURGICAL HISTORY: Past Surgical History:  Procedure Laterality Date  . CHOLECYSTECTOMY    . NECK SURGERY  1994-1995     FAMILY HISTORY: History reviewed. No pertinent family history.   SOCIAL HISTORY:  reports that he has been smoking.  He has been smoking about 0.50 packs per day. He has never used smokeless tobacco. He reports that he does not drink alcohol or use drugs.   ALLERGIES: Patient has no known allergies.   MEDICATIONS:  Current Outpatient Medications  Medication Sig Dispense Refill  . amLODipine (NORVASC) 10 MG tablet Take 10 mg by mouth daily.    Marland Kitchen buPROPion (WELLBUTRIN XL) 150 MG 24 hr tablet Take 150 mg by mouth daily.    . cetirizine (ZYRTEC) 10 MG tablet Take 10 mg by mouth daily as needed for allergies.    . furosemide (LASIX) 40 MG tablet Take 40 mg by mouth daily.     . hydrochlorothiazide (MICROZIDE) 12.5 MG capsule Take 12.5 mg by mouth daily.    . Multiple Vitamins-Minerals (CENTRUM SILVER 50+MEN) TABS Take 1 tablet by mouth daily.    Marland Kitchen oxyCODONE-acetaminophen (PERCOCET) 10-325 MG tablet Take 1 tablet by mouth every 4 (four) hours as needed for pain.    . pantoprazole (PROTONIX) 40 MG tablet Take 40 mg by mouth daily.    Marland Kitchen  Probiotic Product (PROBIOTIC DAILY PO) Take 1 capsule by mouth daily.    . sildenafil (VIAGRA) 100 MG tablet Take 100 mg by mouth daily as needed for erectile dysfunction.     No current facility-administered medications for this encounter.      REVIEW OF SYSTEMS: On review of systems, the patient reports that he is doing well overall. He denies any chest pain, shortness of breath, cough, fevers, chills, night sweats, unintended weight changes. He denies any productive mucous or hemoptysis. 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  He denies any bowel or bladder disturbances, and denies abdominal pain, nausea or vomiting. He denies any new musculoskeletal or joint aches or pains. A complete review of systems is obtained and is otherwise negative.     PHYSICAL EXAM:  Wt Readings from Last 3 Encounters:  08/16/17 (!) 342 lb 6.4 oz (155.3 kg)  08/10/17 (!) 320 lb (145.2 kg)  07/26/17 (!) 320 lb (145.2 kg)   Temp Readings from Last 3 Encounters:  08/16/17 98.1 F (36.7 C)  08/10/17 98.1 F (36.7 C) (Oral)   BP Readings from Last 3 Encounters:  08/16/17 (!) 144/73  08/10/17 (!) 151/72  07/26/17 131/73   Pulse Readings from Last 3 Encounters:  08/16/17 88  08/10/17 78  07/26/17 83   Pain Assessment Pain Score: 2 (left side under breast)/10   In general this is a well appearing caucasian male in no acute distress. He is obese. He is alert and oriented x4 and appropriate throughout the examination. HEENT reveals that the patient is normocephalic, atraumatic. EOMs are intact. PERRLA. Skin is intact without any evidence of gross lesions. Cardiovascular exam reveals a regular rate and rhythm, no clicks rubs or murmurs are auscultated. Chest is clear to auscultation bilaterally. Lymphatic assessment is performed and does not reveal any adenopathy in the cervical, supraclavicular, axillary, or inguinal chains.   ECOG = 0  0 - Asymptomatic (Fully active, able to carry on all predisease activities without restriction)  1 - Symptomatic but completely ambulatory (Restricted in physically strenuous activity but ambulatory and able to carry out work of a light or sedentary nature. For example, light housework, office work)  2 - Symptomatic, <50% in bed during the day (Ambulatory and capable of all self care but unable to carry out any work activities. Up and about more than 50% of waking hours)  3 -  Symptomatic, >50% in bed, but not bedbound (Capable of only limited self-care, confined to bed or chair 50% or more of waking hours)  4 - Bedbound (Completely disabled. Cannot carry on any self-care. Totally confined to bed or chair)  5 - Death   Eustace Pen MM, Creech RH, Tormey DC, et al. [phone]). "Toxicity and response criteria of the [facility]". [place] Oncol. 5 (6): 649-55    LABORATORY DATA:  Lab Results  Component Value Date   WBC 10.3 08/10/2017   HGB 16.5 08/10/2017   HCT 49.0 08/10/2017   MCV 88.8 08/10/2017   PLT 240 08/10/2017   No results found for: NA, K, CL, CO2 No results found for: ALT, AST, GGT, ALKPHOS, BILITOT    RADIOGRAPHY: Nm Pet Image Initial (pi) Skull Base To Thigh  Result Date: 07/18/2017 CLINICAL DATA:  Initial treatment strategy for left lung cancer. EXAM: NUCLEAR MEDICINE PET SKULL BASE TO THIGH TECHNIQUE: 10 mCi F-18 FDG  was injected intravenously. Full-ring PET imaging was performed from the skull base to thigh after the radiotracer. CT data was obtained and used for attenuation correction and anatomic localization. Fasting blood glucose: 115 mg/dl COMPARISON:  CT chest dated 06/27/2017 FINDINGS: Mediastinal blood pool activity: SUV max 3.0 NECK: No hypermetabolic cervical lymphadenopathy. Small bilateral parotid nodes. Focal hypermetabolism within the left parotid gland, max SUV 7.5, technically indeterminate but likely reflecting a benign lesion such as a Warthin's tumor. Incidental CT findings: none CHEST: 3.4 cm subsolid mass in the posterior left upper lobe (series 3/image 88), better evaluated on recent CT, max SUV 20.6, compatible with primary bronchogenic neoplasm such as invasive adenocarcinoma. No hypermetabolic thoracic lymphadenopathy. Incidental CT findings: Mild atherosclerotic calcifications of the aortic arch. Coronary atherosclerosis of the LAD and left coronary artery. ABDOMEN/PELVIS: No abnormal hypermetabolism in the  liver, spleen, pancreas, or adrenal glands. No hypermetabolic abdominopelvic lymphadenopathy. Incidental CT findings: Status post cholecystectomy. 2.1 cm right lower pole renal cyst. Atherosclerotic calcifications the abdominal aorta and branch vessels. Small fat containing bilateral inguinal hernias. SKELETON: No focal hypermetabolic activity to suggest skeletal metastasis. Incidental CT findings: Degenerative changes of the visualized thoracolumbar spine. IMPRESSION: 3.4 cm subsolid mass in the posterior left upper lobe, compatible primary bronchogenic neoplasm such as invasive adenocarcinoma. No evidence of metastatic disease. 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 Electronically Signed   By: Julian Hy M.D.   On: 07/18/2017 09:01   Ct Biopsy  Result Date: 08/10/2017 INDICATION: 67 year old with a suspicious left upper lobe pulmonary lesion. Tissue diagnosis is needed. EXAM: CT-GUIDED CORE BIOPSY OF LEFT UPPER LOBE LUNG LESION MEDICATIONS: None. ANESTHESIA/SEDATION: Moderate (conscious) sedation was employed during this procedure. A total of Versed 1.0 mg and Fentanyl 50 mcg was administered intravenously. Moderate Sedation Time: 18 minutes. The patient's level of consciousness and vital signs were monitored continuously by radiology nursing throughout the procedure under my direct supervision. FLUOROSCOPY TIME:  None COMPLICATIONS: None immediate. PROCEDURE: Informed written consent was obtained from the patient after a thorough discussion of the procedural risks, benefits and alternatives. All questions were addressed. A timeout was performed prior to the initiation of the procedure. Patient was placed prone on the CT scanner. Images through the upper chest were obtained. Sub solid lesion in left upper lobe was identified. The left side of the back was prepped with chlorhexidine and sterile field  was created. Skin and soft tissues were anesthetized with 1% lidocaine. 17 gauge coaxial needle was directed into the lesion with CT guidance. Two core biopsies were obtained with an 18 gauge core device. Specimens placed in formalin. Needle was removed without complication. Bandage placed over the puncture site. FINDINGS: Sub solid lesion along the periphery of the left upper lobe. Needle position confirmed within the lesion. Negative for pneumothorax after the biopsy. IMPRESSION: CT-guided core biopsy of left upper lobe lung lesion. Electronically Signed   By: Markus Daft M.D.   On: 08/10/2017 17:33   Dg Chest Port 1 View  Result Date: 08/10/2017 CLINICAL DATA:  Status post lung biopsy. EXAM: PORTABLE CHEST 1 VIEW COMPARISON:  Radiograph of June 13, 2017. FINDINGS: Stable cardiomediastinal silhouette. Left upper lobe density is noted concerning for possible neoplasm. No pneumothorax is noted status post biopsy. No consolidative process is noted. Bony thorax is unremarkable. IMPRESSION: No pneumothorax seen status post biopsy of left upper lobe mass. Electronically Signed   By: Marijo Conception, M.D.  On: 08/10/2017 13:49       IMPRESSION/PLAN: 1. Stage IB, [id number] NSCLC, adenosquamous carcinoma of the LUL. Dr. Lisbeth Renshaw discusses the pathology findings and reviews the nature of non small cell lung cancer. Dr. Lisbeth Renshaw reviewed his imaging to date and discussed the options of surgical intervention versus stereotactic body radiotherapy (SBRT). The patient has not heard yet regarding Dr. Everrett Coombe input on his PFTs, and his case will be discussed in conference. Dr. Lisbeth Renshaw feels he is a candidate for SBRT if he is not for surgery. We discussed the risks, benefits, short, and long term effects of radiotherapy, and the patient is interested in proceeding. Dr. Lisbeth Renshaw discusses the delivery and logistics of radiotherapy and anticipates a course of 3-5 fractions if he proceeds with radiotherapy. I will follow up with the  patient after discussion in thoracic oncology conference.  In a visit lasting 60 minutes, greater than 50% of the time was spent face to face discussing his case, and coordinating the patient's care.   The above documentation reflects my direct findings during this shared patient visit. Please see the separate note by Dr. Lisbeth Renshaw on this date for the remainder of the patient's plan of care.    Carola Rhine, PAC  This document serves as a record of services personally performed by Kyung Rudd, MD and Shona Simpson, PA-C. It was created on their behalf by Bethann Humble, a trained medical scribe. The creation of this record is based on the scribe's personal observations and the provider's statements to them. This document has been checked and approved by the attending provider.

## 2017-08-17 ENCOUNTER — Encounter: Payer: Self-pay | Admitting: Cardiothoracic Surgery

## 2017-08-17 ENCOUNTER — Other Ambulatory Visit: Payer: Self-pay

## 2017-08-17 ENCOUNTER — Ambulatory Visit (INDEPENDENT_AMBULATORY_CARE_PROVIDER_SITE_OTHER): Payer: Medicare Other | Admitting: Cardiothoracic Surgery

## 2017-08-17 ENCOUNTER — Ambulatory Visit: Payer: Medicare Other | Admitting: Cardiothoracic Surgery

## 2017-08-17 VITALS — BP 135/74 | HR 94 | Resp 18 | Ht 74.0 in | Wt 342.0 lb

## 2017-08-17 DIAGNOSIS — C3412 Malignant neoplasm of upper lobe, left bronchus or lung: Secondary | ICD-10-CM | POA: Diagnosis not present

## 2017-08-17 NOTE — Progress Notes (Signed)
[address]       [place] [address]             [phone]                    [name] [place] Medical Record #[id number] Date of Birth: Aug 14, 1950  Referring: [name], MD Primary Care: [name], MD Primary Cardiologist: No primary care provider on file.  Chief Complaint:    Chief Complaint  Patient presents with  . Lung Cancer    f/u to discuss Biospy 08/10/2017 and PFT's 08/03/2017    History of Present Illness:    Reginald Berry 67 y.o. male is seen in the office  today after needle biopsy of the left upper lobe hypermetabolic lesion was performed .  Needle biopsy: [id number] Lung, needle/core biopsy(ies), Left Upper Lobe - POORLY DIFFERENTIATED CARCINOMA. - SEE MICROSCOPIC DESCRIPTION  Last week the patient was seen initially for hypermetabolic 3 cm lesion in the left upper lobe.  The patient is a long-term smoker more than 45 years and greater one pack a day.  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   Last month he began having what he thought was heart pains, was evaluated in the [facility] emergency room and ultimately transferred to [facility] where he was found to have a gangrenous gallbladder and a cholecystectomy was performed.  In the evaluation prior to making a definitive diagnosis about his gallbladder a CT scan of the chest was performed to rule out pulmonary embolus.   Notes from [facility] "On 4/10 Tbili 2.9->7.4, Alk phos 150, AST/ALT 259/418, direct bilirubin 4.7, lipase 29, GGT 394. RUQ [place] 4/10 w/ cholelithiasis and a thickened gallbladder wall, CBD 5 mm with no intra-or extrahepatic biliary ductal dilatation. EGS consulted as well due to these findings. EUS 4/11 showed normal exam of the bile duct with no sludge or stones visualized. Suspect passage of stone as source for sudden rise in  LFT's and presentation."      Current Activity/ Functional Status:  Patient is not independent with mobility/ambulation, transfers, ADL's, IADL's.   Zubrod Score: At the time of surgery this patient's most appropriate activity status/level should be described as: '[]'$     0    Normal activity, no symptoms '[]'$     1    Restricted in physical strenuous activity but ambulatory, able to do out light work '[]'$     2    Ambulatory and capable of self care, unable to do work activities, up and about               >50 % of waking hours                              '[x]'$     3    Only limited self care, in bed greater than 50% of waking hours '[]'$     4    Completely disabled, no self care, confined to bed or chair '[]'$     5    Moribund   Past medical history: . Arthritis  . Chronic back pain  . Foot drop, right foot  . Hypertension      Past surgical history includes recent cholecystectomy-Fracture surgery  . Orif tibia fracture 06/26/2011  Procedure:  ORIF TIBIA; Surgeon: Harold Hedge, MD; Location: [facility] MAIN OR; Service: Orthopedics; Laterality: Right;        Chronic back and leg pain secondary to logging accident Family history-patient's father died of metastatic lung cancer  Social History   Tobacco Use  Smoking Status Current Every Day Smoker  . Packs/day: 0.50  Smokeless Tobacco Never Used  Tobacco Comment   3 a day    Social History  Patient is a long-term smoker greater than a pack a day for 45 years     Current Outpatient Medications  Medication Sig Dispense Refill  . amLODipine (NORVASC) 10 MG tablet Take 10 mg by mouth daily.    Marland Kitchen buPROPion (WELLBUTRIN XL) 150 MG 24 hr tablet Take 150 mg by mouth daily.    . cetirizine (ZYRTEC) 10 MG tablet Take 10 mg by mouth daily as needed for allergies.    . furosemide (LASIX) 40 MG tablet Take 40 mg by mouth daily.     . hydrochlorothiazide (MICROZIDE) 12.5 MG capsule Take 12.5 mg by mouth daily.    . Multiple  Vitamins-Minerals (CENTRUM SILVER 50+MEN) TABS Take 1 tablet by mouth daily.    Marland Kitchen oxyCODONE-acetaminophen (PERCOCET) 10-325 MG tablet Take 1 tablet by mouth every 4 (four) hours as needed for pain.    . pantoprazole (PROTONIX) 40 MG tablet Take 40 mg by mouth daily.    . Probiotic Product (PROBIOTIC DAILY PO) Take 1 capsule by mouth daily.    . sildenafil (VIAGRA) 100 MG tablet Take 100 mg by mouth daily as needed for erectile dysfunction.     No current facility-administered medications for this visit.       Review of Systems:     Cardiac Review of Systems: [Y] = yes  or   [ N ] = no   Chest Pain [ y   ]  Resting SOB [ y  ] Exertional SOB  Blue.Reese  ]  Orthopnea [ y ]   Pedal Edema [ y  ]    Palpitations [n  ] Syncope  [n  ]   Presyncope [ n  ]   General Review of Systems: [Y] = yes [  ]=no Constitional: recent weight change [  ];  Wt loss over the last 3 months [   ] anorexia [  ]; fatigue [  ]; nausea [  ]; night sweats [  ]; fever [  ]; or chills [  ];           Eye : blurred vision [  ]; diplopia [   ]; vision changes [  ];  Amaurosis fugax[  ]; Resp: cough [ y ];  wheezing[ y ];  hemoptysis[  ]; shortness of breath[y  ]; paroxysmal nocturnal dyspnea[  ]; dyspnea on exertion[  y]; or orthopnea[  ];  GI:  gallstones[  ], vomiting[  ];  dysphagia[  ]; melena[  ];  hematochezia [  ]; heartburn[  ];   Hx of  Colonoscopy[  ]; GU: kidney stones [  ]; hematuria[  ];   dysuria [  ];  nocturia[  ];  history of     obstruction [  ]; urinary frequency [  ]             Skin: rash, swelling[  ];, hair loss[  ];  peripheral edema[  ];  or itching[  ]; Musculosketetal: myalgias[ y ];  joint swelling[  ];  joint erythema[ y ];  joint pain[ y ];  back pain[ y ];  Heme/Lymph: bruising[  ];  bleeding[  ];  anemia[  ];  Neuro: TIA[  ];  headaches[  ];  stroke[  ];  vertigo[  ];  seizures[  ];   paresthesias[y  ];  difficulty walking[  y];  Psych:depression[  ]; anxiety[  ];  Endocrine: diabetes[  ];  thyroid  dysfunction[  ];  Immunizations: Flu up to date [ y ]; Pneumococcal up to date Blue.Reese  ];  Other:    PHYSICAL EXAMINATION: BP 135/74 (BP Location: Left Arm, Patient Position: Sitting, Cuff Size: Large)   Pulse 94   Resp 18   Ht 6' 2"  (1.88 m)   Wt (!) 342 lb (155.1 kg)   SpO2 96% Comment: RA  BMI 43.91 kg/m  General appearance: alert, cooperative, appears older than stated age and moderately obese Head: Normocephalic, without obvious abnormality, atraumatic Neck: no adenopathy, no carotid bruit, no JVD, supple, symmetrical, trachea midline and thyroid not enlarged, symmetric, no tenderness/mass/nodules Lymph nodes: Cervical, supraclavicular, and axillary nodes normal. Resp: rhonchi bilaterally Back: symmetric, no curvature. ROM normal. No CVA tenderness. Cardio: regular rate and rhythm, S1, S2 normal, no murmur, click, rub or gallop GI: soft, non-tender; bowel sounds normal; no masses,  no organomegaly and Patient has healing port sites from his laparoscopic cholecystectomy done several weeks ago Extremities: Patient has bilateral lower extremity edema right slightly greater than left, unable to walk, confined to wheelchair, the patient notes that he is able to stand with help but becomes very wobbly and not able to ambulate Neurologic: Bilateral lower extremity weakness Patient was reexamined today, unchanged from last week still has significant bilateral lower extremity edema left greater than right.  Diagnostic Studies & Laboratory data:     Recent Radiology Findings:  Final Report  CLINICAL DATA: Posterior neck abscess.  EXAM: CT NECK WITH CONTRAST  TECHNIQUE: Multidetector CT imaging of the neck was performed using the standard protocol following the bolus administration of intravenous contrast.  CONTRAST: 75 mL Isovue 370.  COMPARISON: None.  FINDINGS: Pharynx and larynx: No focal mucosal or submucosal lesions are present. The palatine tonsils are enlarged  bilaterally. Soft palate is enlarged. No significant adenoid tissue is present. Lingual tonsils are prominent. The parapharyngeal fat is clear. The epiglottis is normal bilaterally. Vocal cords are midline and symmetric.  Salivary glands: Intraparotid lymph nodes are present in the parotid glands bilaterally. There is a intraparotid lipoma on the right which measures 2 cm. The submandibular glands are within normal limits bilaterally.  Thyroid: Asymmetric right-sided thyroid goiter is present without a discrete mass.  Lymph nodes: No significant cervical adenopathy is present.  Vascular: Atherosclerotic calcifications are present at carotid bifurcations bilaterally. Additional calcifications are present at the aorta. No definite stenosis is present. The study is mildly degraded by patient motion.  Limited intracranial: Negative.  Visualized orbits: Unremarkable.  Mastoids and visualized paranasal sinuses: The paranasal sinuses are clear. There is fluid in the inferior mastoid air cells. No obstructing nasopharyngeal lesion is present  Skeleton: Laminectomies are noted at C4, C5, and C6. Osseous foraminal narrowing is present bilaterally at C3-4, C4-5, C5-6, and C6-7. There is reversal of the normal cervical lordosis. No focal lytic or blastic lesions are present.  Upper chest: Ill-defined airspace disease or mass lesion posteriorly in the left upper lobe measures at least 3.1 cm.  Other: A well-defined peripherally enhancing low-density subcutaneous collection is present in the posterior left neck at the  C3-4 level. There is overlying skin thickening. Surrounding inflammatory changes are present.  IMPRESSION: 1. 3 cm subcutaneous abscess versus infected sebaceous cyst in the posterior left neck. 2. Overlying skin thickening reflecting edema or cellulitis. 3. Bilateral tonsillar hypertrophy. Question acute pharyngitis. 4. Atherosclerosis. 5. Postsurgical changes of the  cervical spine with exaggerated kyphosis and residual foraminal stenosis bilaterally.   Electronically Signed By: San Morelle M.D. On: 02/17/2017 14:58    CLINICAL DATA: Chest pain over the last 3 hours, sedentary patient in wheelchair, smoking history  EXAM: CT ANGIOGRAPHY CHEST WITH CONTRAST  TECHNIQUE: Multidetector CT imaging of the chest was performed using the standard protocol during bolus administration of intravenous contrast. Multiplanar CT image reconstructions and MIPs were obtained to evaluate the vascular anatomy.  CONTRAST: 100 cc Isovue 370  COMPARISON: Chest x-ray of 06/13/2017  FINDINGS: Cardiovascular: Opacification of the pulmonary arteries is not optimal and there is some motion at the lung bases obscuring detail. However no significant central pulmonary embolism is seen. The heart is mildly enlarged. No pericardial effusion is noted. Coronary artery calcifications are present diffusely. The mid ascending thoracic aorta measures 35 mm in diameter.  Mediastinum/Nodes: No mediastinal or hilar adenopathy is seen. Only small mediastinal nodes are present none larger than 13 mm. There is asymmetric enlargement of the right lobe of thyroid which may indicate asymmetric goiter. No low-attenuation nodule is evident. The esophagus is unremarkable.  Lungs/Pleura: There is parenchymal opacity within the posterior left upper low near the apex. Some air bronchograms due course through this opacity and this is most consistent with pneumonia. On the lateral view there is suspicion of a central solid component however and follow-up to ensure clearing is recommended and exclude underlying neoplasm. The remainder of the lungs are well aerated. No pleural effusion is seen. The central airway is patent.  Upper Abdomen: The liver is unremarkable. Multiple gallstones layer dependently within the gallbladder and the gallbladder wall is not thickened. There  appears to be a left adrenal adenoma present.  Musculoskeletal: The thoracic vertebrae are in normal alignment with some degenerative change in the mid to lower thoracic spine.  Review of the MIP images confirms the above findings.  IMPRESSION: 1. No evidence of pulmonary embolism although opacification of the pulmonary arteries is not optimal and there is some pulmonary motion in the lung bases. No central embolus is evident. 2. Parenchymal opacity in the posterior left upper lobe near the apex. This may represent pneumonia but with a somewhat solid central component, follow-up is recommended to exclude underlying neoplasm. 3. Coronary artery disease.   Electronically Signed By: Ivar Drape M.D. On: 06/13/2017 10:30  CLINICAL DATA: Opacity in the left upper lobe with possible solid component on prior CT, follow-up  EXAM: CT CHEST WITH CONTRAST  TECHNIQUE: Multidetector CT imaging of the chest was performed during intravenous contrast administration.  CONTRAST: 60 cc Isovue 370  COMPARISON: CT chest angio of 06/13/2016  FINDINGS: Cardiovascular: Diffuse coronary artery calcifications are present. The heart is within upper limits of normal. No pericardial effusion is seen. Moderate pericardial as well as epicardial fat is present. The pulmonary arteries are unremarkable, being well opacified. Moderate thoracic aortic atherosclerosis is noted. The mid ascending thoracic aorta measures 34 mm in diameter.  Mediastinum/Nodes: There are only small mediastinal lymph nodes present. A superior mediastinal lymph node on the right on image 38 series 2 measures 7 mm. A precarinal node on image 49 measures 7 mm. No definite mediastinal or hilar adenopathy is seen.  As noted previously there appears to be an asymmetric thyroid goiter present right lobe larger than left with some nodularity.  Lungs/Pleura: The ground-glass opacity within the posterior left upper lobe bone in the  pleura still appears to contain part solid central portion, and has not changed significantly in configuration. This lesion measures 2.8 x 3.6 cm.  Adenocarcinoma is considered. Thoracic surgery consultation is recommended. PET/CT should be considered for staging purposes.  These recommendations are taken from:  Recommendations for the Management of Subsolid Pulmonary Nodules Detected at CT: A Statement from the [facility] 2013; 266:1, [phone].  There are changes of centrilobular emphysema noted primarily in the upper lobes. No additional lung infiltrate or ground-glass opacity is seen. No pleural effusion is noted. The central airway is patent.  Upper Abdomen: Within the upper abdomen no hepatic abnormality is noted. In the subcutaneous soft tissues of the midline slightly to the right there is and irregular soft tissue opacity of 21 mm in diameter on image 145. A smaller subcutaneous opacity is noted superficially on image 1 CT 6. These are of questionable significance but could be due to trauma with bruising. There are small gallstones within the slightly contracted gallbladder, as noted previously. Incidental adrenal adenomas again are noted.  Musculoskeletal: There are degenerative changes in the lower thoracic spine. No acute compression deformity or lytic or blastic lesion is seen.  IMPRESSION: 1. No interval change in the rounded gland ground-glass opacity within the posterior left upper lobe with some central solid component. This is worrisome for adenocarcinoma. A thoracic surgery consultation is recommended. PET/CT should be considered for staging purposes.  These recommendations are taken from: Recommendations for the Management of Subsolid Pulmonary Nodules Detected at CT: A Statement from the [facility] 2013; 266:1, [phone]. Diffuse coronary artery calcifications and moderate thoracic aortic atherosclerosis. 2. Asymmetric  thyroid goiter. 3. Two subcutaneous soft tissue structures within the upper abdomen anteriorly as described above. Correlate clinically. 4. Gallstones within the gallbladder. 5.   Electronically Signed By: Ivar Drape M.D. On: 06/27/2017 15:05      Nm Pet Image Initial (pi) Skull Base To Thigh  Result Date: 07/18/2017 CLINICAL DATA:  Initial treatment strategy for left lung cancer. EXAM: NUCLEAR MEDICINE PET SKULL BASE TO THIGH TECHNIQUE: 10 mCi F-18 FDG was injected intravenously. Full-ring PET imaging was performed from the skull base to thigh after the radiotracer. CT data was obtained and used for attenuation correction and anatomic localization. Fasting blood glucose: 115 mg/dl COMPARISON:  CT chest dated 06/27/2017 FINDINGS: Mediastinal blood pool activity: SUV max 3.0 NECK: No hypermetabolic cervical lymphadenopathy. Small bilateral parotid nodes. Focal hypermetabolism within the left parotid gland, max SUV 7.5, technically indeterminate but likely reflecting a benign lesion such as a Warthin's tumor. Incidental CT findings: none CHEST: 3.4 cm subsolid mass in the posterior left upper lobe (series 3/image 88), better evaluated on recent CT, max SUV 20.6, compatible with primary bronchogenic neoplasm such as invasive adenocarcinoma. No hypermetabolic thoracic lymphadenopathy. Incidental CT findings: Mild atherosclerotic calcifications of the aortic arch. Coronary atherosclerosis of the LAD and left coronary artery. ABDOMEN/PELVIS: No abnormal hypermetabolism in the liver, spleen, pancreas, or adrenal glands. No hypermetabolic abdominopelvic lymphadenopathy. Incidental CT findings: Status post cholecystectomy. 2.1 cm right lower pole renal cyst. Atherosclerotic calcifications the abdominal aorta and branch vessels. Small fat containing bilateral inguinal hernias. SKELETON: No focal hypermetabolic activity to suggest skeletal metastasis. Incidental CT findings: Degenerative changes of the  visualized thoracolumbar spine. IMPRESSION: 3.4 cm subsolid  mass in the posterior left upper lobe, compatible primary bronchogenic neoplasm such as invasive adenocarcinoma. No evidence of metastatic disease. 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 Electronically Signed   By: Julian Hy M.D.   On: 07/18/2017 09:01     I have independently reviewed the above radiology studies  and reviewed the findings with the patient.   Recent Lab Findings: Lab Results  Component Value Date   WBC 10.3 08/10/2017   HGB 16.5 08/10/2017   HCT 49.0 08/10/2017   PLT 240 08/10/2017   INR 0.99 08/10/2017    [place], [place]. [address] Transthoracic Echocardiogram Report  Name: ADRIEL, KESSEN Study Date: 06/15/2017 Height: 74 in MRN: [id number] Patient Location: R707 Weight: 332 lb DOB: [birth year] Gender: Male BSA: 2.7 m2 Age: 44 yrsEthnicity: Caucasian BP: 135/82 mmHg Reason For Study: Transaminitis HR: 78  Ordering Physician: [id number] Valaria Good Performed By: Kittie Plater Referring Physician: Sandi Mariscal - -  PROCEDURE Study Quality: Technically difficult. A injection of Optison contrast agent  was performed to improve image quality. Contrast approved by: Pu. Injection  of agitated saline contrast performed to evaluate for possible shunt. - SUMMARY The left ventricular size is normal. Mild left ventricular hypertrophy  Left ventricular systolic function is normal. LV ejection fraction = 60-65%. The right ventricle is normal in size and function. The left atrium is mildly dilated. The right atrium is mildly dilated. No significant stenosis or regurgitation seen There was insufficient TR detected to calculate RV systolic pressure. Estimated right atrial pressure is 5 mmHg.. Injection of agitated  saline showed right-to-left interatrial shunt. There is no pericardial effusion. There is no comparison study available. - FINDINGS:  LEFT VENTRICLE The left ventricular size is normal. Mild left ventricular hypertrophy. Left  ventricular systolic function is normal. LV ejection fraction = 60-65%. Left  ventricular filling pattern is indeterminate. The left ventricular wall  motion is normal. -  RIGHT VENTRICLE The right ventricle is normal in size and function.  LEFT ATRIUM The left atrium is mildly dilated.  RIGHT ATRIUM The right atrium is mildly dilated. Injection of agitated saline showed  right-to-left interatrial shunt. - AORTIC VALVE Structurally normal aortic valve. There is no aortic stenosis. There is no  aortic regurgitation. - MITRAL VALVE The mitral valve is normal in structure and function. There is no mitral  regurgitation noted. - TRICUSPID VALVE Structurally normal tricuspid valve. There is trace tricuspid regurgitation.  There was insufficient TR detected to calculate RV systolic pressure.  Estimated right atrial pressure is 5 mmHg.. - PULMONIC VALVE The pulmonic valve is not well visualized. Trace pulmonic valvular  regurgitation. - ARTERIES The aortic root is not well visualized but is probably normal size. - VENOUS Pulmonary venous flow pattern not well visualized. The IVC is normal in size  with an inspiratory collapse of greater then 50%, suggesting normal right  atrial pressure. - EFFUSION There is no pericardial effusion. - - MMode/2D Measurements & Calculations IVSd: 1.2 cm LVIDd: 4.6 cm LVPWd: 1.2 cm LVIDs: 3.4 cm LA diam: 4.4 cm Ao root: 3.4 cm EDV(MOD-sp4): 174.4 ml ESV(MOD-sp4): 56.3 ml EDV(MOD-sp2): 200.4 ml ESV(MOD-sp2): 96.8 ml LVOT diam: 2.6 cm SV(MOD-sp4): 118.2 ml SI(MOD-sp4): 43.8 ml/m2 IVC 1: 1.9 cm LA area A2: 18.4 cm2 LA area A4: 23.7 cm2 RA area A4: 19.7 cm2 Doppler Measurements & Calculations MV E max  vel: 74.4 cm/sec MV  A max vel: 52.8 cm/sec MV E/A: 1.4 Med Peak E' Vel: 13.0 cm/sec Lat Peak E' Vel: 13.1 cm/sec E/Lat E`: 5.7 E/Med E`: 5.7 MV dec time: 0.21 sec SV(LVOT): 126.7 ml Ao V2 max: 145.1 cm/sec Ao max PG: 8.4 mmHg Ao V2 mean: 89.2 cm/sec Ao mean PG: 3.7 mmHg Ao V2 VTI: 28.2 cm AVA (VTI): 4.5 cm2 LV V1 VTI: 23.3 cm AS Dimensionless Index (VTI): 0.83 AVAi(VTI) cm^2/m^2: 1.7 cm2 SV index(LVOT): 47.0 ml/m2   Reading Physician: [place], [id number] 06/15/2017 04:58 PM  Assessment / Plan:   Patient with poorly differentiated, 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  I further discussed with the patient and his sister risks and options of surgical resection.  The patient in spite of his long-term smoking history has adequate pulmonary function studies, however his overall functional status is extremely poor, presentation with chest pain and congestive heart failure symptoms initially makes it makes extremely poor operative candidate.  We discussed proceeding as Dr. Lisbeth Renshaw outlined with him yesterday with stereotactic radiotherapy to the isolated mass in the left upper lobe.  The patient is agreeable with this approach.  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    Grace Isaac MD      [address] [place] [address] Office [phone]   Beeper [phone]  08/17/2017 4:19 PM

## 2017-08-23 ENCOUNTER — Ambulatory Visit
Admission: RE | Admit: 2017-08-23 | Discharge: 2017-08-23 | Disposition: A | Discharge status: Home / Self Care | Payer: Medicare Other | Source: Ambulatory Visit | Attending: Radiation Oncology | Admitting: Radiation Oncology

## 2017-08-23 DIAGNOSIS — Z510 Encounter for antineoplastic radiation therapy: Secondary | ICD-10-CM | POA: Insufficient documentation

## 2017-08-23 DIAGNOSIS — C3412 Malignant neoplasm of upper lobe, left bronchus or lung: Secondary | ICD-10-CM | POA: Diagnosis not present

## 2017-08-29 DIAGNOSIS — Z510 Encounter for antineoplastic radiation therapy: Secondary | ICD-10-CM | POA: Diagnosis not present

## 2017-08-29 DIAGNOSIS — C3412 Malignant neoplasm of upper lobe, left bronchus or lung: Secondary | ICD-10-CM | POA: Diagnosis not present

## 2017-08-30 ENCOUNTER — Ambulatory Visit
Admission: RE | Admit: 2017-08-30 | Discharge: 2017-08-30 | Disposition: A | Discharge status: Home / Self Care | Payer: Medicare Other | Source: Ambulatory Visit | Attending: Radiation Oncology | Admitting: Radiation Oncology

## 2017-08-30 DIAGNOSIS — C3412 Malignant neoplasm of upper lobe, left bronchus or lung: Secondary | ICD-10-CM | POA: Diagnosis not present

## 2017-08-30 DIAGNOSIS — Z510 Encounter for antineoplastic radiation therapy: Secondary | ICD-10-CM | POA: Diagnosis not present

## 2017-09-01 ENCOUNTER — Ambulatory Visit
Admission: RE | Admit: 2017-09-01 | Discharge: 2017-09-01 | Disposition: A | Discharge status: Home / Self Care | Payer: Medicare Other | Source: Ambulatory Visit | Attending: Radiation Oncology | Admitting: Radiation Oncology

## 2017-09-01 DIAGNOSIS — C3412 Malignant neoplasm of upper lobe, left bronchus or lung: Secondary | ICD-10-CM | POA: Diagnosis not present

## 2017-09-01 DIAGNOSIS — Z510 Encounter for antineoplastic radiation therapy: Secondary | ICD-10-CM | POA: Diagnosis not present

## 2017-09-03 DIAGNOSIS — M5002 Cervical disc disorder with myelopathy, mid-cervical region, unspecified level: Secondary | ICD-10-CM | POA: Diagnosis not present

## 2017-09-04 ENCOUNTER — Ambulatory Visit
Admission: RE | Admit: 2017-09-04 | Discharge: 2017-09-04 | Disposition: A | Discharge status: Home / Self Care | Payer: Medicare Other | Source: Ambulatory Visit | Attending: Radiation Oncology | Admitting: Radiation Oncology

## 2017-09-04 DIAGNOSIS — C3412 Malignant neoplasm of upper lobe, left bronchus or lung: Secondary | ICD-10-CM | POA: Insufficient documentation

## 2017-09-04 DIAGNOSIS — Z510 Encounter for antineoplastic radiation therapy: Secondary | ICD-10-CM | POA: Diagnosis not present

## 2017-09-06 ENCOUNTER — Ambulatory Visit
Admission: RE | Admit: 2017-09-06 | Discharge: 2017-09-06 | Disposition: A | Discharge status: Home / Self Care | Payer: Medicare Other | Source: Ambulatory Visit | Attending: Radiation Oncology | Admitting: Radiation Oncology

## 2017-09-06 DIAGNOSIS — C3412 Malignant neoplasm of upper lobe, left bronchus or lung: Secondary | ICD-10-CM | POA: Diagnosis not present

## 2017-09-06 DIAGNOSIS — Z510 Encounter for antineoplastic radiation therapy: Secondary | ICD-10-CM | POA: Diagnosis not present

## 2017-09-08 ENCOUNTER — Encounter: Payer: Self-pay | Admitting: Radiation Oncology

## 2017-09-08 ENCOUNTER — Ambulatory Visit
Admission: RE | Admit: 2017-09-08 | Discharge: 2017-09-08 | Disposition: A | Discharge status: Home / Self Care | Payer: Medicare Other | Source: Ambulatory Visit | Attending: Radiation Oncology | Admitting: Radiation Oncology

## 2017-09-08 DIAGNOSIS — Z510 Encounter for antineoplastic radiation therapy: Secondary | ICD-10-CM | POA: Diagnosis not present

## 2017-09-08 DIAGNOSIS — C3412 Malignant neoplasm of upper lobe, left bronchus or lung: Secondary | ICD-10-CM | POA: Diagnosis not present

## 2017-09-09 NOTE — Progress Notes (Signed)
[facility] Simulation and Treatment Planning Note   Name:  Reginald Berry MRN: [id number]   Date: 09/09/2017  DOB: Jan 24, 1951  Status:outpatient    DIAGNOSIS:    ICD-10-CM   1. Malignant neoplasm of upper lobe of left lung ([place]) C34.12      CONSENT VERIFIED:yes   SET UP: Patient is setup supine   IMMOBILIZATION: The patient was immobilized using a customized Vac Loc bag/ blue bag and customized accuform device   NARRATIVE:The patient was brought to the [place].  Identity was confirmed.  All relevant records and images related to the planned course of therapy were reviewed.  Then, the patient was positioned in a stable reproducible clinical set-up for radiation therapy. Abdominal compression was applied by me.  4D CT images were obtained and reproducible breathing pattern was confirmed. Free breathing CT images were obtained.  Skin markings were placed.  The CT images were loaded into the planning software where the target and avoidance structures were contoured.  The radiation prescription was entered and confirmed.    TREATMENT PLANNING NOTE:  Treatment planning then occurred. I have requested : IMRT planning.This treatment technique is medically necessary due to the high-dose of radiation delivered to the target region which is in close proximity to adjacent critical normal structures.  3 dimensional simulation is performed and dose volume histogram of the gross tumor volume, planning tumor volume and criticial normal structures including the spinal cord and lungs were analyzed and requested.  Special treatment procedure was performed due to high dose per fraction.  The patient will be monitored for increased risk of toxicity.  Daily imaging using cone beam CT will be used for target localization.  I anticipate that the patient will receive 60 Gy in 5 fractions to target volume. Further adjustments will be made based on  the planning process is necessary.  ------------------------------------------------  Jodelle Gross, MD, PhD

## 2017-09-13 NOTE — Progress Notes (Signed)
  Radiation Oncology         [phone] ________________________________  Name: Reginald Berry MRN: [id number]  Date: 09/08/2017  DOB: 1950-05-29  End of Treatment Note  Diagnosis:   67 y.o. male with Stage IB, cT2aN0M0 NSCLC, adenosquamous carcinoma of the LUL    Indication for treatment:  Curative       Radiation treatment dates:   08/30/2017, 09/01/2017, 09/04/2017, 09/06/2017, 09/08/2017  Site/dose:   The tumor in the LUL was treated with a course of stereotactic body radiation treatment. The patient received 60 Gy in 5 fractions at 12 Gy per fraction.  Narrative: The patient tolerated radiation treatment relatively well.   The patient did not have any signs of acute toxicity during treatment.  Plan: The patient has completed radiation treatment. The patient will return to radiation oncology clinic for routine followup in one month. I advised the patient to call or return sooner if they have any questions or concerns related to their recovery or treatment.   ------------------------------------------------  Jodelle Gross, MD, PhD  This document serves as a record of services personally performed by Kyung Rudd, MD. It was created on his behalf by Rae Lips, a trained medical scribe. The creation of this record is based on the scribe's personal observations and the provider's statements to them. This document has been checked and approved by the attending provider.

## 2017-10-04 DIAGNOSIS — M5002 Cervical disc disorder with myelopathy, mid-cervical region, unspecified level: Secondary | ICD-10-CM | POA: Diagnosis not present

## 2017-10-19 DIAGNOSIS — Z79891 Long term (current) use of opiate analgesic: Secondary | ICD-10-CM | POA: Diagnosis not present

## 2017-10-19 DIAGNOSIS — Z1389 Encounter for screening for other disorder: Secondary | ICD-10-CM | POA: Diagnosis not present

## 2017-10-19 DIAGNOSIS — M5137 Other intervertebral disc degeneration, lumbosacral region: Secondary | ICD-10-CM | POA: Diagnosis not present

## 2017-10-19 DIAGNOSIS — J301 Allergic rhinitis due to pollen: Secondary | ICD-10-CM | POA: Diagnosis not present

## 2017-10-19 DIAGNOSIS — I10 Essential (primary) hypertension: Secondary | ICD-10-CM | POA: Diagnosis not present

## 2017-10-19 DIAGNOSIS — Z23 Encounter for immunization: Secondary | ICD-10-CM | POA: Diagnosis not present

## 2017-10-23 ENCOUNTER — Ambulatory Visit
Admission: RE | Admit: 2017-10-23 | Discharge: 2017-10-23 | Disposition: A | Discharge status: Home / Self Care | Payer: Medicare Other | Source: Ambulatory Visit | Attending: Radiation Oncology | Admitting: Radiation Oncology

## 2017-10-23 ENCOUNTER — Encounter: Payer: Self-pay | Admitting: Radiation Oncology

## 2017-10-23 ENCOUNTER — Other Ambulatory Visit: Payer: Self-pay

## 2017-10-23 VITALS — BP 143/64 | HR 78 | Temp 98.8°F | Resp 22 | Ht 74.0 in | Wt 333.2 lb

## 2017-10-23 DIAGNOSIS — Z79899 Other long term (current) drug therapy: Secondary | ICD-10-CM | POA: Insufficient documentation

## 2017-10-23 DIAGNOSIS — Z923 Personal history of irradiation: Secondary | ICD-10-CM | POA: Diagnosis not present

## 2017-10-23 DIAGNOSIS — C3412 Malignant neoplasm of upper lobe, left bronchus or lung: Secondary | ICD-10-CM | POA: Diagnosis not present

## 2017-10-23 DIAGNOSIS — F17210 Nicotine dependence, cigarettes, uncomplicated: Secondary | ICD-10-CM | POA: Insufficient documentation

## 2017-10-23 LAB — BUN & CREATININE (CHCC)
BUN: 16 mg/dL (ref 8–23)
Creatinine: 1.22 mg/dL (ref 0.61–1.24)

## 2017-10-23 NOTE — Progress Notes (Signed)
Radiation Oncology         [phone] ________________________________  Name: Reginald Berry MRN: [id number]  Date of Service: 10/23/2017 DOB: [birth year]  Post Treatment Note  CC: [name], MD  [name], MD  Diagnosis:   Stage IB, cT2aN0M0 NSCLC, adenosquamous carcinoma of the LUL    Interval Since Last Radiation:  7 weeks   08/30/2017-09/08/2017 SBRT Treatment: The tumor in the LUL was treated with a course of stereotactic body radiation treatment. The patient received 60 Gy in 5 fractions at 12 Gy per fraction  Narrative: The patient presented to the ED in [place] on 06/13/17 with chest pain. The patient was ultimately transferred to [facility] with concerns of possible cardiac event. Chest x-ray was performed at this time revealing a nodular opacity in the left upper lung measuring 2.8 x 2.9 cm. Patchy right basilar opacities also noted. Mild interstitial pulmonary edema also present. The patient was also found on additional imaging to have a gangrenous gallbladder, and he underwent a cholecystectomy.   The patient proceeded with  CT of the chest on 06/27/17. This showed no interval change in the rounded gland ground-glass opacity within the posterior left upper lobe with some central solid component. PET scan on 07/17/17 showed a 3.4 cm sub-solid mass in the posterior left upper lobe, compatible for primary bronchogenic neoplasm such as invasive adenocarcinoma. There was no evidence of metastatic disease. There was focal hypermetabolism within the left parotid gland, technically indeterminate but likely reflecting a benign lesion such as Warthin's tumor.   The patient presented to Dr. Servando Snare on 07/26/17. Per his note, the 3.2 cm left upper lobe mass is highly suspicious for malignancy. He had PFTs performed at [facility] and was to proceed with a biopsy of this site which he underwent on 08/10/17. This showed poorly differentiated carcinoma likely consistent with  adenosquamous features. He elected to proceed with stereotactic body radiotherapy (SBRT). The patient returns today for routine follow-up. He tolerated radiotherapy well without incident.                          On review of systems, the patient states he's feeling about the same as he did prior to treatment. He denies any shortness of breath. He has occasional cough with productive clear mucus. He denies fevers or chills. No complaints are noted.  ALLERGIES:  has No Known Allergies.  Meds: Current Outpatient Medications  Medication Sig Dispense Refill  . amLODipine (NORVASC) 10 MG tablet Take 10 mg by mouth daily.    Marland Kitchen buPROPion (WELLBUTRIN XL) 150 MG 24 hr tablet Take 150 mg by mouth daily.    . cetirizine (ZYRTEC) 10 MG tablet Take 10 mg by mouth daily as needed for allergies.    . furosemide (LASIX) 40 MG tablet Take 40 mg by mouth daily.     . hydrochlorothiazide (MICROZIDE) 12.5 MG capsule Take 12.5 mg by mouth daily.    . Multiple Vitamins-Minerals (CENTRUM SILVER 50+MEN) TABS Take 1 tablet by mouth daily.    Marland Kitchen oxyCODONE-acetaminophen (PERCOCET) 10-325 MG tablet Take 1 tablet by mouth every 4 (four) hours as needed for pain.    . pantoprazole (PROTONIX) 40 MG tablet Take 40 mg by mouth daily.    . Probiotic Product (PROBIOTIC DAILY PO) Take 1 capsule by mouth daily.    . sildenafil (VIAGRA) 100 MG tablet Take 100 mg by mouth daily as needed for erectile dysfunction.  No current facility-administered medications for this encounter.     Physical Findings:  height is 6\' 2"  (1.88 m) and weight is 333 lb 3.2 oz (151.1 kg) (abnormal). His oral temperature is 98.8 F (37.1 C). His blood pressure is 143/64 (abnormal) and his pulse is 78. His respiration is 22 (abnormal) and oxygen saturation is 97%.  Pain Assessment Pain Score: 5  Pain Frequency: Constant Pain Loc: Back/10 In general this is a well appearing caucasian male in no acute distress. He's alert and oriented x4 and  appropriate throughout the examination. Cardiopulmonary assessment is negative for acute distress and he exhibits normal effort with regular rate and rhythm, no clicks, rubs, or murmurs. Chest is clear to auscultation bilaterally.   Lab Findings: Lab Results  Component Value Date   WBC 10.3 08/10/2017   HGB 16.5 08/10/2017   HCT 49.0 08/10/2017   MCV 88.8 08/10/2017   PLT 240 08/10/2017     Radiographic Findings: No results found.  Impression/Plan: 1. Stage IB, cT2aN0M0 NSCLC, adenosquamous carcinoma of the LUL. The patient is recovering from the effects of radiotherapy. He will proceed with CT chest with contrast. We will obtain labs today and he can have his imaging at [facility]. I will call him with results of his scan and anticipate seeing him back for repeat imaging in 6 months per NCCN guidelines.  2. Tobacco cessation. We discussed tips and methods of tapering his cigarette use to decrease the risks of cardiopulmonary disease. He's currently smoking 1/4-1/2 ppd. We will follow this expectantly.        Carola Rhine, PAC

## 2017-11-01 DIAGNOSIS — I10 Essential (primary) hypertension: Secondary | ICD-10-CM | POA: Diagnosis not present

## 2017-11-01 DIAGNOSIS — K219 Gastro-esophageal reflux disease without esophagitis: Secondary | ICD-10-CM | POA: Diagnosis not present

## 2017-11-04 DIAGNOSIS — M5002 Cervical disc disorder with myelopathy, mid-cervical region, unspecified level: Secondary | ICD-10-CM | POA: Diagnosis not present

## 2017-11-20 ENCOUNTER — Telehealth: Payer: Self-pay | Admitting: Radiation Oncology

## 2017-11-20 ENCOUNTER — Ambulatory Visit (HOSPITAL_COMMUNITY)
Admission: RE | Admit: 2017-11-20 | Discharge: 2017-11-20 | Disposition: A | Discharge status: Home / Self Care | Payer: Medicare Other | Source: Ambulatory Visit | Attending: Radiation Oncology | Admitting: Radiation Oncology

## 2017-11-20 DIAGNOSIS — I700 Atherosclerosis of aorta: Secondary | ICD-10-CM | POA: Diagnosis not present

## 2017-11-20 DIAGNOSIS — I2510 Atherosclerotic heart disease of native coronary artery without angina pectoris: Secondary | ICD-10-CM | POA: Diagnosis not present

## 2017-11-20 DIAGNOSIS — I2721 Secondary pulmonary arterial hypertension: Secondary | ICD-10-CM | POA: Diagnosis not present

## 2017-11-20 DIAGNOSIS — C3412 Malignant neoplasm of upper lobe, left bronchus or lung: Secondary | ICD-10-CM

## 2017-11-20 DIAGNOSIS — D3502 Benign neoplasm of left adrenal gland: Secondary | ICD-10-CM | POA: Diagnosis not present

## 2017-11-20 DIAGNOSIS — Z510 Encounter for antineoplastic radiation therapy: Secondary | ICD-10-CM | POA: Diagnosis not present

## 2017-11-20 DIAGNOSIS — C3490 Malignant neoplasm of unspecified part of unspecified bronchus or lung: Secondary | ICD-10-CM | POA: Diagnosis not present

## 2017-11-20 MED ORDER — IOHEXOL 300 MG/ML  SOLN
75.0000 mL | Freq: Once | INTRAMUSCULAR | Status: AC | PRN
Start: 2017-11-20 — End: 2017-11-20
  Administered 2017-11-20: 75 mL via INTRAVENOUS

## 2017-11-20 NOTE — Telephone Encounter (Signed)
I called the patient to review his recent CT scan results and the recommendation to repeat this in 6 months. He is in agreement. Orders were placed. He also had a nodule on his CT in the right lobe of the thyroid gland. I will follow up with Dr. Quillian Quince to make sure he has evaluation of this.

## 2017-11-24 DIAGNOSIS — E041 Nontoxic single thyroid nodule: Secondary | ICD-10-CM | POA: Diagnosis not present

## 2017-11-29 DIAGNOSIS — E041 Nontoxic single thyroid nodule: Secondary | ICD-10-CM | POA: Diagnosis not present

## 2017-12-04 DIAGNOSIS — M5002 Cervical disc disorder with myelopathy, mid-cervical region, unspecified level: Secondary | ICD-10-CM | POA: Diagnosis not present

## 2017-12-22 DIAGNOSIS — E041 Nontoxic single thyroid nodule: Secondary | ICD-10-CM | POA: Diagnosis not present

## 2018-01-17 DIAGNOSIS — Z79891 Long term (current) use of opiate analgesic: Secondary | ICD-10-CM | POA: Diagnosis not present

## 2018-01-17 DIAGNOSIS — Z9189 Other specified personal risk factors, not elsewhere classified: Secondary | ICD-10-CM | POA: Diagnosis not present

## 2018-01-17 DIAGNOSIS — K219 Gastro-esophageal reflux disease without esophagitis: Secondary | ICD-10-CM | POA: Diagnosis not present

## 2018-01-17 DIAGNOSIS — I10 Essential (primary) hypertension: Secondary | ICD-10-CM | POA: Diagnosis not present

## 2018-01-23 DIAGNOSIS — Z1389 Encounter for screening for other disorder: Secondary | ICD-10-CM | POA: Diagnosis not present

## 2018-01-23 DIAGNOSIS — Z23 Encounter for immunization: Secondary | ICD-10-CM | POA: Diagnosis not present

## 2018-01-23 DIAGNOSIS — Z79891 Long term (current) use of opiate analgesic: Secondary | ICD-10-CM | POA: Diagnosis not present

## 2018-01-23 DIAGNOSIS — G252 Other specified forms of tremor: Secondary | ICD-10-CM | POA: Diagnosis not present

## 2018-01-23 DIAGNOSIS — I10 Essential (primary) hypertension: Secondary | ICD-10-CM | POA: Diagnosis not present

## 2018-03-23 DIAGNOSIS — J301 Allergic rhinitis due to pollen: Secondary | ICD-10-CM | POA: Diagnosis not present

## 2018-03-23 DIAGNOSIS — M5137 Other intervertebral disc degeneration, lumbosacral region: Secondary | ICD-10-CM | POA: Diagnosis not present

## 2018-03-23 DIAGNOSIS — G252 Other specified forms of tremor: Secondary | ICD-10-CM | POA: Diagnosis not present

## 2018-03-23 DIAGNOSIS — I10 Essential (primary) hypertension: Secondary | ICD-10-CM | POA: Diagnosis not present

## 2018-05-03 ENCOUNTER — Telehealth: Payer: Self-pay | Admitting: *Deleted

## 2018-05-03 ENCOUNTER — Telehealth: Payer: Self-pay | Admitting: Radiation Oncology

## 2018-05-03 NOTE — Telephone Encounter (Signed)
The patient's sister called stating her brother is having new pain in his shoulder and is worried his cancer is back. He has a history of stage I lung cancer treated with SBRT. We will proceed with his CT for further evaluation of his chest as he is due in the next few weeks for surveillance scans anyway. And message was left for his sister.

## 2018-05-03 NOTE — Telephone Encounter (Signed)
CALLED PATIENT'S SISTER- PAULA TO INFORM OF CT FOR HER BROTHER, Reginald Berry - STAT LABS ON 3-3- @ 9:45 AM @ [place] AND HIS CT FOR 05-08-18 - ARRIVAL TIME- 10:45 AM @ [place] [facility], PT. TO HAVE WATER ONLY - 4 HRS. PRIOR TO TEST, LVM FOR A RETURN CALL

## 2018-05-04 ENCOUNTER — Telehealth: Payer: Self-pay | Admitting: *Deleted

## 2018-05-04 NOTE — Telephone Encounter (Signed)
Spoke with the patient who had concerns of cancer recurrence and his requests for a new scan.  I informed him he is scheduled for a CT scan on 05/08/2018 at 9:45am.  His sister is aware of this appointment.  Will continue to follow as necessary.  Gloriajean Dell. Leonie Green, BSN

## 2018-05-07 ENCOUNTER — Telehealth: Payer: Self-pay | Admitting: Radiation Oncology

## 2018-05-07 NOTE — Telephone Encounter (Signed)
I spoke with the patient and he stated he was having chest wall pain. He is not sure if he had a rash or not, but is having a CT tomorrow. We discussed differential dx includes rib fracture from radiation exposure versus shingles. If CT is negative for fx, would recommend he follow up with Dr. Quillian Quince his PCP.

## 2018-05-08 ENCOUNTER — Ambulatory Visit (HOSPITAL_COMMUNITY)
Admission: RE | Admit: 2018-05-08 | Discharge: 2018-05-08 | Disposition: A | Discharge status: Home / Self Care | Payer: Medicare Other | Source: Ambulatory Visit | Attending: Radiation Oncology | Admitting: Radiation Oncology

## 2018-05-08 ENCOUNTER — Telehealth: Payer: Self-pay | Admitting: Radiation Oncology

## 2018-05-08 DIAGNOSIS — C3412 Malignant neoplasm of upper lobe, left bronchus or lung: Secondary | ICD-10-CM | POA: Diagnosis not present

## 2018-05-08 DIAGNOSIS — C3490 Malignant neoplasm of unspecified part of unspecified bronchus or lung: Secondary | ICD-10-CM | POA: Diagnosis not present

## 2018-05-08 LAB — POCT I-STAT CREATININE: Creatinine, Ser: 1.1 mg/dL (ref 0.61–1.24)

## 2018-05-08 MED ORDER — GABAPENTIN 300 MG PO CAPS: 300.0000 mg | ORAL_CAPSULE | Freq: Three times a day (TID) | ORAL | 0 refills | Status: DC

## 2018-05-08 MED ORDER — IOHEXOL 300 MG/ML  SOLN
75.0000 mL | Freq: Once | INTRAMUSCULAR | Status: AC | PRN
  Administered 2018-05-08: 75 mL via INTRAVENOUS

## 2018-05-08 NOTE — Telephone Encounter (Signed)
I called the patient and let him know that there were no lesions in the shoulder, and no rib fracture to explain his left chest wall pain. His recently treated LUL lesion is improving, however he has a new ground glass 13 mm lesion in the lateral aspect of his LUL that is concerning. This appears to track along the chest wall. Dr. Lisbeth Renshaw has reviewed films and may be able to treat this with SBRT again if it was found to be metabolically active. So we will proceed with PET imaging and present his case in thoracic oncology conference. For his pain he was offered gabapentin and he was interested. I called in a new Rx and reviewed the side effect profile of the medication. He is in agreement and I will contact him following his PET and discussion in conference.

## 2018-05-14 ENCOUNTER — Telehealth: Payer: Self-pay | Admitting: *Deleted

## 2018-05-14 NOTE — Telephone Encounter (Signed)
CALLED PATIENT TO INFORM OF  PET SCAN FOR 05-21-18 - ARRIVAL TIME - 6 PM @ [place] [facility], PT. TO HAVE WATER ONLY - 6 HRS. PRIOR TO TEST, SPOKE WITH PATIENT AND HE IS AWARE OF THIS TEST

## 2018-05-17 ENCOUNTER — Other Ambulatory Visit: Payer: Self-pay | Admitting: *Deleted

## 2018-05-17 NOTE — Progress Notes (Signed)
The proposed treatment discussed in cancer conference 05/17/2018 is for discussion purpose only and is not a binding recommendation.  The patient was not physically examined nor present for their treatment options.  Therefore, final treatment plans cannot be decided.

## 2018-05-21 ENCOUNTER — Ambulatory Visit: Payer: Self-pay | Admitting: Radiation Oncology

## 2018-05-21 ENCOUNTER — Encounter (HOSPITAL_COMMUNITY)
Admission: RE | Admit: 2018-05-21 | Discharge: 2018-05-21 | Disposition: A | Discharge status: Home / Self Care | Payer: Medicare Other | Source: Ambulatory Visit | Attending: Radiation Oncology | Admitting: Radiation Oncology

## 2018-05-21 ENCOUNTER — Other Ambulatory Visit: Payer: Self-pay

## 2018-05-21 ENCOUNTER — Telehealth: Payer: Self-pay | Admitting: *Deleted

## 2018-05-21 DIAGNOSIS — C3412 Malignant neoplasm of upper lobe, left bronchus or lung: Secondary | ICD-10-CM | POA: Insufficient documentation

## 2018-05-21 DIAGNOSIS — C3490 Malignant neoplasm of unspecified part of unspecified bronchus or lung: Secondary | ICD-10-CM | POA: Diagnosis not present

## 2018-05-21 MED ORDER — FLUDEOXYGLUCOSE F - 18 (FDG) INJECTION
15.0700 | Freq: Once | INTRAVENOUS | Status: AC | PRN
  Administered 2018-05-21: 15.07 via INTRAVENOUS

## 2018-05-21 NOTE — Telephone Encounter (Signed)
Called patient's sister [name] to inform that her brother doesn't need to come for fu visit today per Shona Simpson' request, lvm for a return call

## 2018-05-22 ENCOUNTER — Telehealth: Payer: Self-pay | Admitting: Radiation Oncology

## 2018-05-22 NOTE — Telephone Encounter (Signed)
I called the patient to let him know his PET results and that I suspect Dr. Lisbeth Renshaw would offer SBRT to this new lesion. I will have to confirm this with Dr. Lisbeth Renshaw when he returns to clinic next week. The patient is in agreement. He reported some improvement in his chest wall pain since starting gabapentin and we will follow this expectantly. He will call if he needs a refill of this as well in the coming weeks. I will call him once Dr. Lisbeth Renshaw has weighed in on his case.

## 2018-05-31 ENCOUNTER — Telehealth: Payer: Self-pay | Admitting: Radiation Oncology

## 2018-05-31 NOTE — Telephone Encounter (Addendum)
After Dr. Lisbeth Renshaw was able to review his last CT and recent PET scan, he would like to offer the patient a course of SBRT to the left suprahilar nodule that measured 2.5 x 1.7 cm on PET. I reviewed with him that this would be similar to the course he had last summer when we treated his LUL. We would presume this to be a stage IA3, T1cN0M0 putative NSCLC lung cancer given his prior history and imaging to date. He would receive 3-5 fxns to this site and we reviewed the delivery and logistics of treatment. We will go over formal consent detailing risks and benefits as well as side effect possibilities. He is in agreement and will receive a call from our department to coordinate simulation in the next 1-2 weeks.    Carola Rhine, PAC

## 2018-06-11 ENCOUNTER — Other Ambulatory Visit: Payer: Self-pay | Admitting: Radiation Oncology

## 2018-06-11 MED ORDER — GABAPENTIN 300 MG PO CAPS: 300.0000 mg | ORAL_CAPSULE | Freq: Three times a day (TID) | ORAL | 0 refills | Status: DC

## 2018-06-14 ENCOUNTER — Other Ambulatory Visit: Payer: Self-pay

## 2018-06-14 ENCOUNTER — Ambulatory Visit
Admission: RE | Admit: 2018-06-14 | Discharge: 2018-06-14 | Disposition: A | Discharge status: Home / Self Care | Payer: Medicare Other | Source: Ambulatory Visit | Attending: Radiation Oncology | Admitting: Radiation Oncology

## 2018-06-14 DIAGNOSIS — Z510 Encounter for antineoplastic radiation therapy: Secondary | ICD-10-CM | POA: Insufficient documentation

## 2018-06-14 DIAGNOSIS — C3412 Malignant neoplasm of upper lobe, left bronchus or lung: Secondary | ICD-10-CM | POA: Diagnosis not present

## 2018-06-20 DIAGNOSIS — Z510 Encounter for antineoplastic radiation therapy: Secondary | ICD-10-CM | POA: Diagnosis not present

## 2018-06-20 DIAGNOSIS — C3412 Malignant neoplasm of upper lobe, left bronchus or lung: Secondary | ICD-10-CM | POA: Diagnosis not present

## 2018-06-26 ENCOUNTER — Ambulatory Visit
Admission: RE | Admit: 2018-06-26 | Discharge: 2018-06-26 | Disposition: A | Discharge status: Home / Self Care | Payer: Medicare Other | Source: Ambulatory Visit | Attending: Radiation Oncology | Admitting: Radiation Oncology

## 2018-06-26 ENCOUNTER — Other Ambulatory Visit: Payer: Self-pay

## 2018-06-26 DIAGNOSIS — Z510 Encounter for antineoplastic radiation therapy: Secondary | ICD-10-CM | POA: Diagnosis not present

## 2018-06-26 DIAGNOSIS — C3412 Malignant neoplasm of upper lobe, left bronchus or lung: Secondary | ICD-10-CM | POA: Diagnosis not present

## 2018-06-28 ENCOUNTER — Other Ambulatory Visit: Payer: Self-pay

## 2018-06-28 ENCOUNTER — Ambulatory Visit
Admission: RE | Admit: 2018-06-28 | Discharge: 2018-06-28 | Disposition: A | Discharge status: Home / Self Care | Payer: Medicare Other | Source: Ambulatory Visit | Attending: Radiation Oncology | Admitting: Radiation Oncology

## 2018-06-28 DIAGNOSIS — Z510 Encounter for antineoplastic radiation therapy: Secondary | ICD-10-CM | POA: Diagnosis not present

## 2018-06-28 DIAGNOSIS — C3412 Malignant neoplasm of upper lobe, left bronchus or lung: Secondary | ICD-10-CM | POA: Diagnosis not present

## 2018-06-29 ENCOUNTER — Ambulatory Visit: Payer: Medicare Other | Admitting: Radiation Oncology

## 2018-07-02 ENCOUNTER — Ambulatory Visit
Admission: RE | Admit: 2018-07-02 | Discharge: 2018-07-02 | Disposition: A | Discharge status: Home / Self Care | Payer: Medicare Other | Source: Ambulatory Visit | Attending: Radiation Oncology | Admitting: Radiation Oncology

## 2018-07-02 ENCOUNTER — Other Ambulatory Visit: Payer: Self-pay

## 2018-07-02 DIAGNOSIS — Z510 Encounter for antineoplastic radiation therapy: Secondary | ICD-10-CM | POA: Diagnosis not present

## 2018-07-02 DIAGNOSIS — C3412 Malignant neoplasm of upper lobe, left bronchus or lung: Secondary | ICD-10-CM | POA: Diagnosis not present

## 2018-07-04 ENCOUNTER — Other Ambulatory Visit: Payer: Self-pay

## 2018-07-04 ENCOUNTER — Ambulatory Visit
Admission: RE | Admit: 2018-07-04 | Discharge: 2018-07-04 | Disposition: A | Discharge status: Home / Self Care | Payer: Medicare Other | Source: Ambulatory Visit | Attending: Radiation Oncology | Admitting: Radiation Oncology

## 2018-07-04 DIAGNOSIS — C3412 Malignant neoplasm of upper lobe, left bronchus or lung: Secondary | ICD-10-CM | POA: Diagnosis not present

## 2018-07-04 DIAGNOSIS — Z510 Encounter for antineoplastic radiation therapy: Secondary | ICD-10-CM | POA: Diagnosis not present

## 2018-07-05 ENCOUNTER — Ambulatory Visit: Payer: Medicare Other | Admitting: Radiation Oncology

## 2018-07-05 NOTE — Progress Notes (Signed)
[place] [facility] Simulation and Treatment Planning Note   Name:  Reginald Berry MRN: [id number]   Date: 06/14/2018  DOB: [birth year]  Status:outpatient    DIAGNOSIS:    ICD-10-CM   1. Malignant neoplasm of upper lobe of left lung ([place]) C34.12      CONSENT VERIFIED:yes   SET UP: Patient is setup supine   IMMOBILIZATION: The patient was immobilized using a customized Vac Loc bag/ blue bag and customized accuform device   NARRATIVE:The patient was brought to the [place].  Identity was confirmed.  All relevant records and images related to the planned course of therapy were reviewed.  Then, the patient was positioned in a stable reproducible clinical set-up for radiation therapy. Abdominal compression was applied by me.  4D CT images were obtained and reproducible breathing pattern was confirmed. Free breathing CT images were obtained.  Skin markings were placed.  The CT images were loaded into the planning software where the target and avoidance structures were contoured.  The radiation prescription was entered and confirmed.    TREATMENT PLANNING NOTE:  Treatment planning then occurred. I have requested : IMRT planning.This treatment technique is medically necessary due to the high-dose of radiation delivered to the target region which is in close proximity to adjacent critical normal structures.  3 dimensional simulation is performed and dose volume histogram of the gross tumor volume, planning tumor volume and criticial normal structures including the spinal cord and lungs were analyzed and requested.  Special treatment procedure was performed due to high dose per fraction.  The patient will be monitored for increased risk of toxicity.  Daily imaging using cone beam CT will be used for target localization.  I anticipate that the patient will receive 60 Gy in 5 fractions to target volume. Further adjustments will be made based on  the planning process is necessary.  ------------------------------------------------  Jodelle Gross, MD, PhD

## 2018-07-06 ENCOUNTER — Ambulatory Visit
Admission: RE | Admit: 2018-07-06 | Discharge: 2018-07-06 | Disposition: A | Discharge status: Home / Self Care | Payer: Medicare Other | Source: Ambulatory Visit | Attending: Radiation Oncology | Admitting: Radiation Oncology

## 2018-07-06 ENCOUNTER — Encounter: Payer: Self-pay | Admitting: Radiation Oncology

## 2018-07-06 ENCOUNTER — Other Ambulatory Visit: Payer: Self-pay

## 2018-07-06 DIAGNOSIS — C3412 Malignant neoplasm of upper lobe, left bronchus or lung: Secondary | ICD-10-CM | POA: Insufficient documentation

## 2018-07-06 DIAGNOSIS — Z510 Encounter for antineoplastic radiation therapy: Secondary | ICD-10-CM | POA: Diagnosis not present

## 2018-07-23 DIAGNOSIS — E041 Nontoxic single thyroid nodule: Secondary | ICD-10-CM | POA: Diagnosis not present

## 2018-07-23 DIAGNOSIS — I10 Essential (primary) hypertension: Secondary | ICD-10-CM | POA: Diagnosis not present

## 2018-07-26 DIAGNOSIS — I10 Essential (primary) hypertension: Secondary | ICD-10-CM | POA: Diagnosis not present

## 2018-07-26 DIAGNOSIS — M5137 Other intervertebral disc degeneration, lumbosacral region: Secondary | ICD-10-CM | POA: Diagnosis not present

## 2018-07-26 DIAGNOSIS — M5002 Cervical disc disorder with myelopathy, mid-cervical region, unspecified level: Secondary | ICD-10-CM | POA: Diagnosis not present

## 2018-08-01 ENCOUNTER — Encounter: Payer: Self-pay | Admitting: Radiation Oncology

## 2018-08-02 ENCOUNTER — Telehealth: Payer: Self-pay | Admitting: Radiation Oncology

## 2018-08-02 DIAGNOSIS — C3412 Malignant neoplasm of upper lobe, left bronchus or lung: Secondary | ICD-10-CM

## 2018-08-02 NOTE — Telephone Encounter (Signed)
  Radiation Oncology         [phone] ________________________________  Name: Reginald Berry MRN: [id number]  Date of Service: 08/02/2018  DOB: [birth year]  Post Treatment Telephone Note  Diagnosis:   Stage IB, cT2aN0M0 NSCLC, adenosquamous carcinoma of the LUL s/p SBRT, and putative Stage IA3, T1cN0M0 putative NSCLC lung cancer of the left suprahilar region  Interval Since Last Radiation:  4 weeks   06/26/2018-07/06/2018 SBRT Treatment: The tumor in the left suprahilar lung region was treated to 60Gy in 5 fractions at 12 Gy per fraction  08/30/2017-09/08/2017 SBRT Treatment: The tumor in the [place] treated with a course of stereotactic body radiation treatment. The patient received 60Gy in44fractions at 12Gyper fraction.   Narrative:  The patient was contacted today for routine follow-up. During treatment she did very well with radiotherapy and did not have significant desquamation or untoward side effects.   Impression/Plan: 1. 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 The patient has been doing well since completion of radiotherapy to the left suprahilar region. He will be due for a CT in the next few weeks and I've placed orders for this. We will contact him once this has been performed. Hopefully we can move back to 6 month intervals for scans thereafter. 2. Chest wall pain. The patient's pain has resolved. He was taking gabapentin but has decided to continue this due to peripheral neuropathy. I encouraged him to seek refills through his PCP if it needs to be continued for peripheral neuropathy.    Carola Rhine, PAC

## 2018-08-03 ENCOUNTER — Telehealth: Payer: Self-pay | Admitting: *Deleted

## 2018-08-03 NOTE — Telephone Encounter (Signed)
CALLED PATIENT TO INFORM OF CT FOR 08-16-18 - ARRIVAL TIME - 3:45 PM @  RADIOLOGY, [place] TO CALL PATIENT WITH RESULTS, SPOKE WITH PATIENT AND HE IS AWARE OF THIS TEST AND THE PHONE CALL TO BE MADE WITH RESULTS.

## 2018-08-14 NOTE — Progress Notes (Signed)
  Radiation Oncology         [phone] ________________________________  Name: Reginald Berry MRN: [id number]  Date: 07/06/2018  DOB: 05/13/1950  End of Treatment Note  Diagnosis:   68 y.o. male with Stage IB, cT2aN0M0 NSCLC, adenosquamous carcinoma of the LUL s/p SBRT, and putative Stage IA3, T1cN0M0putative NSCLClung cancer of the left suprahilar region  Indication for treatment:  Curative       Radiation treatment dates:   06/26/2018, 06/28/2018, 07/02/2018, 07/04/2018, 07/06/2018  Site/dose:   The tumor in the left suprahilar region was treated with a course of stereotactic body radiation treatment. The patient received 60 Gy in 5 fractions at 12 Gy per fraction.  Beams/energy:   SBRT/SRT-VMAT // 6X-FFF Photon  Narrative: The patient tolerated radiation treatment relatively well.   The patient did not have any signs of acute toxicity during treatment. He reported some muscle soreness in his chest but experienced no changes to his breathing.  Plan: The patient has completed radiation treatment. The patient will return to radiation oncology clinic for routine followup in one month. I advised the patient to call or return sooner if they have any questions or concerns related to their recovery or treatment.   ------------------------------------------------  Jodelle Gross, MD, PhD  This document serves as a record of services personally performed by Kyung Rudd, MD. It was created on his behalf by Rae Lips, a trained medical scribe. The creation of this record is based on the scribe's personal observations and the provider's statements to them. This document has been checked and approved by the attending provider.

## 2018-08-16 ENCOUNTER — Other Ambulatory Visit: Payer: Self-pay

## 2018-08-16 ENCOUNTER — Ambulatory Visit (HOSPITAL_COMMUNITY)
Admission: RE | Admit: 2018-08-16 | Discharge: 2018-08-16 | Disposition: A | Discharge status: Home / Self Care | Payer: Medicare Other | Source: Ambulatory Visit | Attending: Radiation Oncology | Admitting: Radiation Oncology

## 2018-08-16 DIAGNOSIS — C3412 Malignant neoplasm of upper lobe, left bronchus or lung: Secondary | ICD-10-CM | POA: Diagnosis not present

## 2018-08-16 LAB — POCT I-STAT CREATININE: Creatinine, Ser: 1 mg/dL (ref 0.61–1.24)

## 2018-08-16 MED ORDER — IOHEXOL 300 MG/ML  SOLN
75.0000 mL | Freq: Once | INTRAMUSCULAR | Status: AC | PRN
  Administered 2018-08-16: 19:00:00 75 mL via INTRAVENOUS

## 2018-08-20 ENCOUNTER — Telehealth: Payer: Self-pay | Admitting: Radiation Oncology

## 2018-08-20 ENCOUNTER — Other Ambulatory Visit: Payer: Self-pay | Admitting: Radiation Oncology

## 2018-08-20 DIAGNOSIS — C3412 Malignant neoplasm of upper lobe, left bronchus or lung: Secondary | ICD-10-CM

## 2018-08-20 NOTE — Telephone Encounter (Signed)
I called the patient's sister [name] since I couldn't reach the patient. I let her know the plans for repeat CT scan in 6 months of the chest, and that I've reached out to Dr. [name] in Urology given the findings in the left kidney.

## 2018-08-28 ENCOUNTER — Telehealth: Payer: Self-pay | Admitting: Radiation Oncology

## 2018-08-28 DIAGNOSIS — N2889 Other specified disorders of kidney and ureter: Secondary | ICD-10-CM

## 2018-08-28 NOTE — Telephone Encounter (Signed)
Pt called wanting to discuss his CT results and need for MRI. We discussed the rationale for the MRI to eval his left kidney and to be seen by Dr. [name]. We will plan repeat CT chest in 6 months.

## 2018-08-30 ENCOUNTER — Telehealth: Payer: Self-pay | Admitting: *Deleted

## 2018-08-30 ENCOUNTER — Other Ambulatory Visit: Payer: Self-pay | Admitting: Radiation Oncology

## 2018-08-30 DIAGNOSIS — N2889 Other specified disorders of kidney and ureter: Secondary | ICD-10-CM

## 2018-08-30 NOTE — Telephone Encounter (Signed)
Called patient to inform of STAT labs on 09-12-18 @ 10:15 am @ Reginald Berry and his MRI - arrival time- 11:15 am, patient to be NPO- 4 hrs. prior to test, patient to have consultation with Dr. [name] on 09-18-18 - arrival time- 8:30 am, spoke with patient and he is aware of these appts.

## 2018-09-06 ENCOUNTER — Telehealth: Payer: Self-pay | Admitting: *Deleted

## 2018-09-06 NOTE — Telephone Encounter (Signed)
RETURNED [place] PHONE CALL, SPOKE WITH PAULA

## 2018-09-12 ENCOUNTER — Other Ambulatory Visit: Payer: Self-pay

## 2018-09-12 ENCOUNTER — Telehealth: Payer: Self-pay | Admitting: Radiation Oncology

## 2018-09-12 ENCOUNTER — Ambulatory Visit (HOSPITAL_COMMUNITY)
Admission: RE | Admit: 2018-09-12 | Discharge: 2018-09-12 | Disposition: A | Discharge status: Home / Self Care | Payer: Medicare Other | Source: Ambulatory Visit | Attending: Radiation Oncology | Admitting: Radiation Oncology

## 2018-09-12 ENCOUNTER — Other Ambulatory Visit (HOSPITAL_COMMUNITY): Payer: Self-pay

## 2018-09-12 DIAGNOSIS — N2889 Other specified disorders of kidney and ureter: Secondary | ICD-10-CM

## 2018-09-12 DIAGNOSIS — C3492 Malignant neoplasm of unspecified part of left bronchus or lung: Secondary | ICD-10-CM | POA: Diagnosis not present

## 2018-09-12 LAB — CREATININE, SERUM
Creatinine, Ser: 1.08 mg/dL (ref 0.61–1.24)
GFR calc Af Amer: >60 mL/min (ref >60)
GFR calc non Af Amer: >60 mL/min (ref >60)

## 2018-09-12 LAB — BUN: BUN: 13 mg/dL (ref 8–23)

## 2018-09-12 MED ORDER — GADOBUTROL 1 MMOL/ML IV SOLN
10.0000 mL | Freq: Once | INTRAVENOUS | Status: AC | PRN
  Administered 2018-09-12: 13:00:00 10 mL via INTRAVENOUS

## 2018-09-12 NOTE — Telephone Encounter (Signed)
I called the patient and couldn't get through. I called his sister [name] to let her know the findings and the plan to see Dr. [name] on Tuesday. We will follow along and I will plan to see him after his next CT scan in December 2020.

## 2018-09-18 DIAGNOSIS — D3002 Benign neoplasm of left kidney: Secondary | ICD-10-CM | POA: Diagnosis not present

## 2018-09-19 ENCOUNTER — Other Ambulatory Visit: Payer: Self-pay | Admitting: Urology

## 2018-09-19 DIAGNOSIS — N2889 Other specified disorders of kidney and ureter: Secondary | ICD-10-CM

## 2018-09-27 ENCOUNTER — Other Ambulatory Visit: Payer: Self-pay

## 2018-09-27 ENCOUNTER — Encounter: Payer: Self-pay | Admitting: *Deleted

## 2018-09-27 ENCOUNTER — Ambulatory Visit
Admission: RE | Admit: 2018-09-27 | Discharge: 2018-09-27 | Disposition: A | Discharge status: Home / Self Care | Payer: Medicare Other | Source: Ambulatory Visit | Attending: Urology | Admitting: Urology

## 2018-09-27 DIAGNOSIS — N2889 Other specified disorders of kidney and ureter: Secondary | ICD-10-CM

## 2018-09-27 DIAGNOSIS — Z85118 Personal history of other malignant neoplasm of bronchus and lung: Secondary | ICD-10-CM | POA: Diagnosis not present

## 2018-09-27 DIAGNOSIS — Z923 Personal history of irradiation: Secondary | ICD-10-CM | POA: Diagnosis not present

## 2018-09-27 HISTORY — PX: IR RADIOLOGIST EVAL & MGMT: IMG5224

## 2018-09-27 NOTE — Consult Note (Signed)
Chief Complaint: Patient was consulted remotely today (TeleHealth) for treatment of a left renal mass at the request of Berry,Reginald W.    Referring Physician(s): Berry,Reginald W  History of Present Illness: Reginald Berry is a 68 y.o. 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  A second focus of carcinoma in the central left perihilar upper lung was treated with SBRT in late April and early May of this year.  A PET scan on 05/21/2018 also revealed a posterior interpolar left renal lesion.  This was further characterized by MRI on 09/12/2018 demonstrating a 2.7 cm rounded mass of the posterior interpolar left renal cortex that is partially cystic and demonstrates irregular internal enhancement.  Cystic lesions of the right kidney are consistent with benign cysts.  The patient is asymptomatic with respect to the renal mass.  He was referred to Dr. [name] who has now referred the patient for biopsy and percutaneous ablation.  Past Medical History:  Diagnosis Date  . Hypertension     Past Surgical History:  Procedure Laterality Date  . CHOLECYSTECTOMY    . NECK SURGERY  1994-1995    Allergies: Patient has no known allergies.  Medications: Prior to Admission medications   Medication Sig Start Date End Date Taking? Authorizing Provider  amLODipine (NORVASC) 10 MG tablet Take 10 mg by mouth daily.    [provider]  buPROPion (WELLBUTRIN XL) 150 MG 24 hr tablet Take 150 mg by mouth daily.    [provider]  cetirizine (ZYRTEC) 10 MG tablet Take 10 mg by mouth daily as needed for allergies.    [provider]  furosemide (LASIX) 40 MG tablet Take 40 mg by mouth daily.     [provider]  gabapentin (NEURONTIN) 300 MG capsule Take 1 capsule (300 mg total) by mouth 3 (three) times daily. 06/11/18   [name], PA-C  hydrochlorothiazide  (MICROZIDE) 12.5 MG capsule Take 12.5 mg by mouth daily.    [provider]  Multiple Vitamins-Minerals (CENTRUM SILVER 50+MEN) TABS Take 1 tablet by mouth daily.    [provider]  oxyCODONE-acetaminophen (PERCOCET) 10-325 MG tablet Take 1 tablet by mouth every 4 (four) hours as needed for pain.    [provider]  pantoprazole (PROTONIX) 40 MG tablet Take 40 mg by mouth daily.    [provider]  Probiotic Product (PROBIOTIC DAILY PO) Take 1 capsule by mouth daily.    [provider]  sildenafil (VIAGRA) 100 MG tablet Take 100 mg by mouth daily as needed for erectile dysfunction.    [provider]     No family history on file.  Social History   Socioeconomic History  . Marital status: Single    Spouse name: Not on file  . Number of children: Not on file  . Years of education: Not on file  . Highest education level: Not on file  Occupational History  . Not on file  Social Needs  . Financial resource strain: Not on file  . Food insecurity    Worry: Not on file    Inability: Not on file  . Transportation needs    Medical: Not on file    Non-medical: Not on file  Tobacco Use  . Smoking status: Current Every Day Smoker    Packs/day: 0.50  . Smokeless tobacco: Never Used  . Tobacco comment: 3 a day  Substance  and Sexual Activity  . Alcohol use: Never    Frequency: Never  . Drug use: Never  . Sexual activity: Not Currently  Lifestyle  . Physical activity    Days per week: Not on file    Minutes per session: Not on file  . Stress: Not on file  Relationships  . Social Herbalist on phone: Not on file    Gets together: Not on file    Attends religious service: Not on file    Active member of club or organization: Not on file    Attends meetings of clubs or organizations: Not on file    Relationship status: Not on file  Other Topics Concern  . Not on file  Social History Narrative   10-23-17 Unable to ask  abuse questions wife and other family with him today.    ECOG Status: 0 - Asymptomatic  Review of Systems  Constitutional: Negative.   Respiratory: Negative.   Cardiovascular: Negative.   Gastrointestinal: Negative.   Genitourinary: Negative.   Musculoskeletal: Negative.   Neurological: Negative.     Review of Systems: A 12 point ROS discussed and pertinent positives are indicated in the HPI above.  All other systems are negative.  Physical Exam No direct physical exam was performed (except for noted visual exam findings with Video Visits).   Vital Signs: There were no vitals taken for this visit.  Imaging: Mr Abdomen W Wo Contrast  Result Date: 09/12/2018 CLINICAL DATA:  Indeterminate left renal lesion on recent chest CT. Left lung carcinoma. EXAM: MRI ABDOMEN WITHOUT AND WITH CONTRAST TECHNIQUE: Multiplanar multisequence MR imaging of the abdomen was performed both before and after the administration of intravenous contrast. CONTRAST:  10 mL Gadavist COMPARISON:  CT on 08/16/2018 FINDINGS: Image degradation by motion artifact noted. Lower chest: No acute findings. Hepatobiliary: No hepatic masses identified. Sub-cm cyst seen in anterior right lobe. Prior cholecystectomy. No evidence of biliary obstruction. Pancreas:  No mass or inflammatory changes. Spleen:  Within normal limits in size and appearance. Adrenals/Urinary Tract: No adrenal masses identified. Several small simple cysts are noted in right kidney. A 2.7 cm mass is seen in the posterior midpole of the left kidney showing heterogeneous T2 hyperintensity and heterogeneous contrast enhancement. This is consistent with a solid renal neoplasm. No other renal masses are identified. No hydronephrosis or renal vein or IVC thrombus. Stomach/Bowel: Visualized portion unremarkable. Vascular/Lymphatic: No pathologically enlarged lymph nodes identified. No abdominal aortic aneurysm. Other:  None. Musculoskeletal:  No suspicious bone lesions  identified. IMPRESSION: 2.7 cm solid mass in posterior midpole of left kidney, consistent with renal cell carcinoma. No evidence of abdominal metastatic disease. Electronically Signed   By: Marlaine Hind M.D.   On: 09/12/2018 13:58    Labs:  CBC: No results for input(s): WBC, HGB, HCT, PLT in the last 8760 hours.  COAGS: No results for input(s): INR, APTT in the last 8760 hours.  BMP: Recent Labs    10/23/17 1433 05/08/18 0946 08/16/18 1621 09/12/18 1105  BUN 16  --   --  13  CREATININE 1.22 1.10 1.00 1.08  GFRNONAA >60  --   --  >60  GFRAA >60  --   --  >60     Assessment and Plan:  I spoke with Mr. Powe by phone.  I reviewed MRI findings with him.  This demonstrates a rounded lesion of the left posterior interpolar kidney that is partially exophytic and partially endophytic.  The lesion by  my measurements is approximately 2.6 x 2.3 x 2.5 cm.  The lesion is well-circumscribed, partially cystic and demonstrates mild and irregular internal enhancement.  Imaging characteristics are suspicious for a partially cystic renal carcinoma.  This is less likely to represent a metastatic lesion by imaging.  I discussed treatment options with Mr. Aguayo including percutaneous cryoablation.  The lesion is of size and location that would be amenable to cryoablation.  Biopsy could be performed at the time of ablation to confirm a tissue diagnosis.  The procedure is performed under general anesthesia and typically is followed with overnight observation.  After discussion, Mr. Steward would like to proceed with scheduling a cryoablation procedure.  We will begin the scheduling and authorization process.  The procedure is performed at [facility] and I would suspect that it will be performed in early to mid August.   Electronically Signed: Azzie Roup 09/27/2018, 1:53 PM   I spent a total of  30 Minutes in remote  clinical consultation, greater than 50% of which was counseling/coordinating  care for treatment of a left renal mass.    Visit type: Audio only (telephone). Audio (no video) only due to patient's lack of internet/smartphone capability. Alternative for in-person consultation at [facility], [address], [place], [place]. This visit type was conducted due to national recommendations for restrictions regarding the COVID-19 Pandemic (e.g. social distancing).  This format is felt to be most appropriate for this patient at this time.  All issues noted in this document were discussed and addressed.

## 2018-09-28 ENCOUNTER — Other Ambulatory Visit (HOSPITAL_COMMUNITY): Payer: Self-pay | Admitting: Interventional Radiology

## 2018-09-28 DIAGNOSIS — N2889 Other specified disorders of kidney and ureter: Secondary | ICD-10-CM

## 2018-10-11 ENCOUNTER — Other Ambulatory Visit: Payer: Self-pay | Admitting: Student

## 2018-10-11 ENCOUNTER — Other Ambulatory Visit (HOSPITAL_COMMUNITY): Payer: Self-pay | Admitting: *Deleted

## 2018-10-11 NOTE — Patient Instructions (Addendum)
YOU NEED TO HAVE A COVID 19 TEST ON__8-8-20_____ @_______ , THIS TEST MUST BE DONE BEFORE SURGERY, COME  [address] [place] , [address]. ONCE YOUR COVID TEST IS COMPLETED, PLEASE BEGIN THE QUARANTINE INSTRUCTIONS AS OUTLINED IN YOUR HANDOUT.                TAHA DIMOND     Your procedure is scheduled on: 10-17-2018  Report to [facility] Main  Entrance              Report to admitting at           2 AM   [address] [place].    Call this number if you have problems the morning of surgery [phone]    Remember: Do not eat food or drink liquids :After Midnight. BRUSH YOUR TEETH MORNING OF SURGERY AND RINSE YOUR MOUTH OUT, NO CHEWING GUM CANDY OR MINTS.     Take these medicines the morning of surgery with A SIP OF WATER: protonix, gabapentin, wellbutrin, amlodipine                                You may not have any metal on your body including hair pins and              piercings  Do not wear jewelry,, lotions, powders or perfumes, deodorant                        Men may shave face and neck.   Do not bring valuables to the hospital. [place].  Contacts, dentures or bridgework may not be worn into surgery.   ___________________________________________________________________           [facility] - Preparing for Surgery Before surgery, you can play an important role.  Because skin is not sterile, your skin needs to be as free of germs as possible.  You can reduce the number of germs on your skin by washing with CHG (chlorahexidine gluconate) soap before surgery.  CHG is an antiseptic cleaner which kills germs and bonds with the skin to continue killing germs even after washing. Please DO NOT use if you have an allergy to CHG or antibacterial soaps.  If your skin becomes reddened/irritated stop using the CHG and inform your nurse when you arrive at Short  Stay. Do not shave (including legs and underarms) for at least 48 hours prior to the first CHG shower.  You may shave your face/neck. Please follow these instructions carefully:  1.  Shower with CHG Soap the night before surgery and the  morning of Surgery.  2.  If you choose to wash your hair, wash your hair first as usual with your  normal  shampoo.  3.  After you shampoo, rinse your hair and body thoroughly to remove the  shampoo.                           4.  Use CHG as you would any other liquid soap.  You can apply chg directly  to the skin and wash  Gently with a scrungie or clean washcloth.  5.  Apply the CHG Soap to your body ONLY FROM THE NECK DOWN.   Do not use on face/ open                           Wound or open sores. Avoid contact with eyes, ears mouth and genitals (private parts).                       Wash face,  Genitals (private parts) with your normal soap.             6.  Wash thoroughly, paying special attention to the area where your surgery  will be performed.  7.  Thoroughly rinse your body with warm water from the neck down.  8.  DO NOT shower/wash with your normal soap after using and rinsing off  the CHG Soap.                9.  Pat yourself dry with a clean towel.            10.  Wear clean pajamas.            11.  Place clean sheets on your bed the night of your first shower and do not  sleep with pets. Day of Surgery : Do not apply any lotions/deodorants the morning of surgery.  Please wear clean clothes to the hospital/surgery center.  FAILURE TO FOLLOW THESE INSTRUCTIONS MAY RESULT IN THE CANCELLATION OF YOUR SURGERY PATIENT SIGNATURE_________________________________  NURSE SIGNATURE__________________________________  ________________________________________________________________________

## 2018-10-12 ENCOUNTER — Other Ambulatory Visit: Payer: Self-pay

## 2018-10-12 ENCOUNTER — Encounter (HOSPITAL_COMMUNITY): Payer: Self-pay

## 2018-10-12 ENCOUNTER — Encounter (HOSPITAL_COMMUNITY)
Admission: RE | Admit: 2018-10-12 | Discharge: 2018-10-12 | Disposition: A | Discharge status: Home / Self Care | Payer: Medicare Other | Source: Ambulatory Visit | Attending: Interventional Radiology | Admitting: Interventional Radiology

## 2018-10-12 HISTORY — DX: Unspecified osteoarthritis, unspecified site: M19.90

## 2018-10-12 HISTORY — DX: Gastro-esophageal reflux disease without esophagitis: K21.9

## 2018-10-13 ENCOUNTER — Other Ambulatory Visit (HOSPITAL_COMMUNITY): Payer: Medicare Other

## 2018-10-17 ENCOUNTER — Inpatient Hospital Stay (HOSPITAL_COMMUNITY): Admission: RE | Admit: 2018-10-17 | Payer: Medicare Other | Source: Ambulatory Visit

## 2018-10-25 ENCOUNTER — Encounter (HOSPITAL_COMMUNITY): Payer: Self-pay

## 2018-10-25 NOTE — Patient Instructions (Addendum)
DUE TO COVID-19 ONLY ONE VISITOR IS ALLOWED TO COME WITH YOU AND STAY IN THE WAITING ROOM ONLY DURING PRE OP AND PROCEDURE. THE ONE VISITOR MAY VISIT WITH YOU IN YOUR PRIVATE ROOM DURING VISITING HOURS ONLY!!    COVID SWAB TESTING MUST BE COMPLETED ON: Saturday, Aug. 22, 2020 at  10:00AM  [address], [place] -[facility] enter pre surgical testing line (Must self quarantine after testing. Follow instructions on handout.)             Your procedure is scheduled on: Wednesday, Aug. 26, 2020   Report to [facility] Main  Entrance    Report to admitting at 6:30 AM   Call this number if you have problems the morning of surgery [phone]   Do not eat food or drink liquids :After Midnight.   Brush your teeth the morning of surgery.   Do NOT smoke 24 HOURS BEFORE SURGERY    Take these medicines the morning of surgery with A SIP OF WATER: Amlodipine, Bupropion, Buspirone, Liothyronine                               You may not have any metal on your body including jewelry, and body piercings             Do not wear lotions, powders, perfumes/cologne, or deodorant                         Men may shave face and neck.   Do not bring valuables to the hospital. [place].   Contacts, dentures or bridgework may not be worn into surgery.   Bring small overnight bag day of surgery.               Please read over the following fact sheets you were given:    [facility] - Preparing for Surgery Before surgery, you can play an important role.  Because skin is not sterile, your skin needs to be as free of germs as possible.  You can reduce the number of germs on your skin by washing with CHG (chlorahexidine gluconate) soap before surgery.  CHG is an antiseptic cleaner which kills germs and bonds with the skin to continue killing germs even after washing. Please DO NOT use if you have an allergy to CHG or  antibacterial soaps.  If your skin becomes reddened/irritated stop using the CHG and inform your nurse when you arrive at Short Stay. Do not shave (including legs and underarms) for at least 48 hours prior to the first CHG shower.  You may shave your face/neck.  Please follow these instructions carefully:  1.  Shower with CHG Soap the night before surgery and the  morning of surgery.  2.  If you choose to wash your hair, wash your hair first as usual with your normal  shampoo.  3.  After you shampoo, rinse your hair and body thoroughly to remove the shampoo.                             4.  Use CHG as you would any other liquid soap.  You can apply chg directly to the skin and wash.  Gently with a scrungie  or clean washcloth.  5.  Apply the CHG Soap to your body ONLY FROM THE NECK DOWN.   Do   not use on face/ open                           Wound or open sores. Avoid contact with eyes, ears mouth and   genitals (private parts).                       Wash face,  Genitals (private parts) with your normal soap.             6.  Wash thoroughly, paying special attention to the area where your    surgery  will be performed.  7.  Thoroughly rinse your body with warm water from the neck down.  8.  DO NOT shower/wash with your normal soap after using and rinsing off the CHG Soap.                9.  Pat yourself dry with a clean towel.            10.  Wear clean pajamas.            11.  Place clean sheets on your bed the night of your first shower and do not  sleep with pets. Day of Surgery : Do not apply any lotions/deodorants the morning of surgery.  Please wear clean clothes to the hospital/surgery center.  FAILURE TO FOLLOW THESE INSTRUCTIONS MAY RESULT IN THE CANCELLATION OF YOUR SURGERY  PATIENT SIGNATURE_________________________________  NURSE SIGNATURE__________________________________  ________________________________________________________________________

## 2018-10-25 NOTE — Progress Notes (Signed)
ECHO 06/15/2017 in care everywhere.  CT chest 08/17/2018 in epic.

## 2018-10-26 ENCOUNTER — Ambulatory Visit (HOSPITAL_COMMUNITY)
Admission: RE | Admit: 2018-10-26 | Discharge: 2018-10-26 | Disposition: A | Discharge status: Home / Self Care | Payer: Medicare Other | Source: Ambulatory Visit | Attending: Radiology | Admitting: Radiology

## 2018-10-26 ENCOUNTER — Encounter (HOSPITAL_COMMUNITY): Payer: Self-pay

## 2018-10-26 ENCOUNTER — Other Ambulatory Visit: Payer: Self-pay | Admitting: Radiology

## 2018-10-26 ENCOUNTER — Other Ambulatory Visit: Payer: Self-pay

## 2018-10-26 ENCOUNTER — Encounter (HOSPITAL_COMMUNITY)
Admission: RE | Admit: 2018-10-26 | Discharge: 2018-10-26 | Disposition: A | Discharge status: Home / Self Care | Payer: Medicare Other | Source: Ambulatory Visit | Attending: Interventional Radiology | Admitting: Interventional Radiology

## 2018-10-26 DIAGNOSIS — I10 Essential (primary) hypertension: Secondary | ICD-10-CM | POA: Diagnosis not present

## 2018-10-26 DIAGNOSIS — R918 Other nonspecific abnormal finding of lung field: Secondary | ICD-10-CM | POA: Diagnosis not present

## 2018-10-26 DIAGNOSIS — Z79899 Other long term (current) drug therapy: Secondary | ICD-10-CM | POA: Diagnosis not present

## 2018-10-26 DIAGNOSIS — N2889 Other specified disorders of kidney and ureter: Secondary | ICD-10-CM | POA: Insufficient documentation

## 2018-10-26 DIAGNOSIS — Z01818 Encounter for other preprocedural examination: Secondary | ICD-10-CM

## 2018-10-26 DIAGNOSIS — Z85118 Personal history of other malignant neoplasm of bronchus and lung: Secondary | ICD-10-CM | POA: Insufficient documentation

## 2018-10-26 DIAGNOSIS — K219 Gastro-esophageal reflux disease without esophagitis: Secondary | ICD-10-CM | POA: Insufficient documentation

## 2018-10-26 HISTORY — DX: Malignant neoplasm of unspecified part of unspecified bronchus or lung: C34.90

## 2018-10-26 LAB — BASIC METABOLIC PANEL
Anion gap: 12 (ref 5–15)
BUN: 13 mg/dL (ref 8–23)
CO2: 21 mmol/L — ABNORMAL LOW (ref 22–32)
Calcium: 9.4 mg/dL (ref 8.9–10.3)
Chloride: 102 mmol/L (ref 98–111)
Creatinine, Ser: 0.96 mg/dL (ref 0.61–1.24)
GFR calc Af Amer: >60 mL/min (ref >60)
GFR calc non Af Amer: >60 mL/min (ref >60)
Glucose, Bld: 106 mg/dL — ABNORMAL HIGH (ref 70–99)
Potassium: 4.6 mmol/L (ref 3.5–5.1)
Sodium: 135 mmol/L (ref 135–145)

## 2018-10-26 LAB — CBC WITH DIFFERENTIAL/PLATELET
Abs Immature Granulocytes: 0.07 10*3/uL (ref 0.00–0.07)
Basophils Absolute: 0.1 10*3/uL (ref 0.0–0.1)
Basophils Relative: 1 %
Eosinophils Absolute: 0.1 10*3/uL (ref 0.0–0.5)
Eosinophils Relative: 1 %
HCT: 49 % (ref 39.0–52.0)
Hemoglobin: 16.2 g/dL (ref 13.0–17.0)
Immature Granulocytes: 1 %
Lymphocytes Relative: 16 %
Lymphs Abs: 1.6 10*3/uL (ref 0.7–4.0)
MCH: 29.5 pg (ref 26.0–34.0)
MCHC: 33.1 g/dL (ref 30.0–36.0)
MCV: 89.1 fL (ref 80.0–100.0)
Monocytes Absolute: 0.8 10*3/uL (ref 0.1–1.0)
Monocytes Relative: 8 %
Neutro Abs: 7.4 10*3/uL (ref 1.7–7.7)
Neutrophils Relative %: 73 %
Platelets: 266 10*3/uL (ref 150–400)
RBC: 5.5 MIL/uL (ref 4.22–5.81)
RDW: 14.6 % (ref 11.5–15.5)
WBC: 10 10*3/uL (ref 4.0–10.5)
nRBC: 0 % (ref 0.0–0.2)

## 2018-10-26 LAB — PROTIME-INR
INR: 0.9 (ref 0.8–1.2)
Prothrombin Time: 12.5 seconds (ref 11.4–15.2)

## 2018-10-26 LAB — ABO/RH: ABO/RH(D): O POS

## 2018-10-26 NOTE — Progress Notes (Signed)
PCP - terry daniel  Cardiologist - none currently   Chest x-ray - done today at pre-op , result pending  EKG - done today at pre-op , confirmation pending  Stress Test -  ECHO -  06-15-17 epic care everywhere due to transaminitis -subsequent cholecystectomy   Cardiac Cath - denies   Sleep Study - n/a CPAP - n/a  Fasting Blood Sugar -  Checks Blood Sugar _____ times a day  Blood Thinner Instructions: Aspirin Instructions: Last Dose:  Anesthesia review:   cxr and ekg pending results , please review final result  Patient denies shortness of breath, fever, cough and chest pain at PAT appointment   Patient verbalized understanding of instructions that were given to them at the PAT appointment. Patient was also instructed that they will need to review over the PAT instructions again at home before surgery.

## 2018-10-27 ENCOUNTER — Other Ambulatory Visit (HOSPITAL_COMMUNITY)
Admission: RE | Admit: 2018-10-27 | Discharge: 2018-10-27 | Disposition: A | Discharge status: Home / Self Care | Payer: Medicare Other | Source: Ambulatory Visit | Attending: Interventional Radiology | Admitting: Interventional Radiology

## 2018-10-29 ENCOUNTER — Other Ambulatory Visit (HOSPITAL_COMMUNITY)
Admission: RE | Admit: 2018-10-29 | Discharge: 2018-10-29 | Disposition: A | Discharge status: Home / Self Care | Payer: Medicare Other | Source: Ambulatory Visit | Attending: Interventional Radiology | Admitting: Interventional Radiology

## 2018-10-29 DIAGNOSIS — Z01812 Encounter for preprocedural laboratory examination: Secondary | ICD-10-CM | POA: Diagnosis not present

## 2018-10-29 DIAGNOSIS — Z20828 Contact with and (suspected) exposure to other viral communicable diseases: Secondary | ICD-10-CM | POA: Insufficient documentation

## 2018-10-29 LAB — SARS CORONAVIRUS 2 (TAT 6-24 HRS): SARS Coronavirus 2: NEGATIVE

## 2018-10-30 ENCOUNTER — Other Ambulatory Visit: Payer: Self-pay | Admitting: Physician Assistant

## 2018-10-30 ENCOUNTER — Encounter (HOSPITAL_COMMUNITY): Payer: Self-pay | Admitting: Anesthesiology

## 2018-10-30 NOTE — Anesthesia Preprocedure Evaluation (Addendum)
Anesthesia Evaluation  Patient identified by MRN, date of birth, ID band Patient awake    Reviewed: Allergy & Precautions, NPO status , Patient's Chart, lab work & pertinent test results  Airway Mallampati: III  TM Distance: >3 FB Neck ROM: Full    Dental  (+) Poor Dentition, Missing, Dental Advisory Given   Pulmonary Current Smoker,     + decreased breath sounds      Cardiovascular hypertension,  Rhythm:Regular Rate:Normal     Neuro/Psych negative neurological ROS  negative psych ROS   GI/Hepatic Neg liver ROS, GERD  ,  Endo/Other  negative endocrine ROS  Renal/GU negative Renal ROS     Musculoskeletal  (+) Arthritis ,   Abdominal Normal abdominal exam  (+)   Peds  Hematology negative hematology ROS (+)   Anesthesia Other Findings   Reproductive/Obstetrics                            Anesthesia Physical Anesthesia Plan  ASA: III  Anesthesia Plan: General   Post-op Pain Management:    Induction: Intravenous  PONV Risk Score and Plan: 2 and Ondansetron and Dexamethasone  Airway Management Planned: Oral ETT  Additional Equipment: None  Intra-op Plan:   Post-operative Plan: Extubation in OR  Informed Consent: I have reviewed the patients History and Physical, chart, labs and discussed the procedure including the risks, benefits and alternatives for the proposed anesthesia with the patient or authorized representative who has indicated his/her understanding and acceptance.     Dental advisory given  Plan Discussed with: CRNA  Anesthesia Plan Comments: (Breathing tx in Pre op)       Anesthesia Quick Evaluation

## 2018-10-31 ENCOUNTER — Encounter (HOSPITAL_COMMUNITY): Payer: Self-pay | Admitting: General Practice

## 2018-10-31 ENCOUNTER — Encounter (HOSPITAL_COMMUNITY)
Admission: RE | Disposition: A | Discharge status: Home / Self Care | Payer: Self-pay | Source: Home / Self Care | Attending: Interventional Radiology

## 2018-10-31 ENCOUNTER — Encounter (HOSPITAL_COMMUNITY): Payer: Self-pay

## 2018-10-31 ENCOUNTER — Other Ambulatory Visit: Payer: Self-pay

## 2018-10-31 ENCOUNTER — Ambulatory Visit (HOSPITAL_COMMUNITY): Payer: Medicare Other | Admitting: Physician Assistant

## 2018-10-31 ENCOUNTER — Ambulatory Visit (HOSPITAL_COMMUNITY)
Admission: RE | Admit: 2018-10-31 | Discharge: 2018-10-31 | Disposition: A | Discharge status: Home / Self Care | Payer: Medicare Other | Source: Ambulatory Visit | Attending: Interventional Radiology | Admitting: Interventional Radiology

## 2018-10-31 ENCOUNTER — Ambulatory Visit (HOSPITAL_COMMUNITY): Payer: Medicare Other | Admitting: Certified Registered"

## 2018-10-31 ENCOUNTER — Observation Stay (HOSPITAL_COMMUNITY)
Admission: RE | Admit: 2018-10-31 | Discharge: 2018-11-01 | Disposition: A | Discharge status: Home / Self Care | Payer: Medicare Other | Attending: Interventional Radiology | Admitting: Interventional Radiology

## 2018-10-31 DIAGNOSIS — F17210 Nicotine dependence, cigarettes, uncomplicated: Secondary | ICD-10-CM | POA: Diagnosis not present

## 2018-10-31 DIAGNOSIS — I10 Essential (primary) hypertension: Secondary | ICD-10-CM | POA: Diagnosis not present

## 2018-10-31 DIAGNOSIS — N2889 Other specified disorders of kidney and ureter: Secondary | ICD-10-CM

## 2018-10-31 DIAGNOSIS — Z79899 Other long term (current) drug therapy: Secondary | ICD-10-CM | POA: Insufficient documentation

## 2018-10-31 DIAGNOSIS — C642 Malignant neoplasm of left kidney, except renal pelvis: Principal | ICD-10-CM | POA: Insufficient documentation

## 2018-10-31 DIAGNOSIS — K219 Gastro-esophageal reflux disease without esophagitis: Secondary | ICD-10-CM | POA: Diagnosis not present

## 2018-10-31 DIAGNOSIS — C3412 Malignant neoplasm of upper lobe, left bronchus or lung: Secondary | ICD-10-CM | POA: Diagnosis not present

## 2018-10-31 DIAGNOSIS — Z85118 Personal history of other malignant neoplasm of bronchus and lung: Secondary | ICD-10-CM | POA: Insufficient documentation

## 2018-10-31 DIAGNOSIS — M1990 Unspecified osteoarthritis, unspecified site: Secondary | ICD-10-CM | POA: Diagnosis not present

## 2018-10-31 HISTORY — PX: RADIOLOGY WITH ANESTHESIA: SHX6223

## 2018-10-31 LAB — CBC WITH DIFFERENTIAL/PLATELET
Abs Immature Granulocytes: 0.04 10*3/uL (ref 0.00–0.07)
Basophils Absolute: 0.1 10*3/uL (ref 0.0–0.1)
Basophils Relative: 1 %
Eosinophils Absolute: 0.2 10*3/uL (ref 0.0–0.5)
Eosinophils Relative: 2 %
HCT: 47.2 % (ref 39.0–52.0)
Hemoglobin: 15.6 g/dL (ref 13.0–17.0)
Immature Granulocytes: 1 %
Lymphocytes Relative: 25 %
Lymphs Abs: 1.8 10*3/uL (ref 0.7–4.0)
MCH: 29.4 pg (ref 26.0–34.0)
MCHC: 33.1 g/dL (ref 30.0–36.0)
MCV: 88.9 fL (ref 80.0–100.0)
Monocytes Absolute: 0.8 10*3/uL (ref 0.1–1.0)
Monocytes Relative: 11 %
Neutro Abs: 4.6 10*3/uL (ref 1.7–7.7)
Neutrophils Relative %: 60 %
Platelets: 270 10*3/uL (ref 150–400)
RBC: 5.31 MIL/uL (ref 4.22–5.81)
RDW: 15.1 % (ref 11.5–15.5)
WBC: 7.4 10*3/uL (ref 4.0–10.5)
nRBC: 0 % (ref 0.0–0.2)

## 2018-10-31 LAB — BASIC METABOLIC PANEL
Anion gap: 9 (ref 5–15)
BUN: 17 mg/dL (ref 8–23)
CO2: 20 mmol/L — ABNORMAL LOW (ref 22–32)
Calcium: 8.7 mg/dL — ABNORMAL LOW (ref 8.9–10.3)
Chloride: 104 mmol/L (ref 98–111)
Creatinine, Ser: 1.1 mg/dL (ref 0.61–1.24)
GFR calc Af Amer: >60 mL/min (ref >60)
GFR calc non Af Amer: >60 mL/min (ref >60)
Glucose, Bld: 103 mg/dL — ABNORMAL HIGH (ref 70–99)
Potassium: 4.1 mmol/L (ref 3.5–5.1)
Sodium: 133 mmol/L — ABNORMAL LOW (ref 135–145)

## 2018-10-31 LAB — PROTIME-INR
INR: 1 (ref 0.8–1.2)
Prothrombin Time: 12.9 seconds (ref 11.4–15.2)

## 2018-10-31 LAB — TYPE AND SCREEN
ABO/RH(D): O POS
Antibody Screen: NEGATIVE

## 2018-10-31 SURGERY — CT WITH ANESTHESIA
Anesthesia: General | Laterality: Left

## 2018-10-31 MED ORDER — CEFAZOLIN SODIUM-DEXTROSE 2-4 GM/100ML-% IV SOLN
2.0000 g | INTRAVENOUS | Status: AC
  Administered 2018-10-31: 09:00:00 2 g via INTRAVENOUS
  Filled 2018-10-31: qty 100

## 2018-10-31 MED ORDER — FENTANYL CITRATE (PF) 100 MCG/2ML IJ SOLN: 25.0000 ug | INTRAMUSCULAR | Status: DC | PRN

## 2018-10-31 MED ORDER — FUROSEMIDE 40 MG PO TABS
40.0000 mg | ORAL_TABLET | Freq: Every day | ORAL | Status: DC
  Administered 2018-11-01: 10:00:00 40 mg via ORAL
  Filled 2018-10-31: qty 1

## 2018-10-31 MED ORDER — ALBUTEROL SULFATE HFA 108 (90 BASE) MCG/ACT IN AERS
INHALATION_SPRAY | RESPIRATORY_TRACT | Status: AC
  Filled 2018-10-31: qty 6.7

## 2018-10-31 MED ORDER — SENNOSIDES-DOCUSATE SODIUM 8.6-50 MG PO TABS: 1.0000 | ORAL_TABLET | Freq: Every day | ORAL | Status: DC | PRN

## 2018-10-31 MED ORDER — SODIUM CHLORIDE (PF) 0.9 % IJ SOLN
INTRAMUSCULAR | Status: AC
  Filled 2018-10-31: qty 50

## 2018-10-31 MED ORDER — ACETAMINOPHEN 325 MG PO TABS: 325.0000 mg | ORAL_TABLET | Freq: Once | ORAL | Status: DC | PRN

## 2018-10-31 MED ORDER — HYDROCODONE-ACETAMINOPHEN 5-325 MG PO TABS: 1.0000 | ORAL_TABLET | ORAL | Status: DC | PRN

## 2018-10-31 MED ORDER — LIOTHYRONINE SODIUM 25 MCG PO TABS
25.0000 ug | ORAL_TABLET | Freq: Every day | ORAL | Status: DC
  Administered 2018-10-31: 25 ug via ORAL
  Filled 2018-10-31: qty 1

## 2018-10-31 MED ORDER — ALBUTEROL SULFATE (2.5 MG/3ML) 0.083% IN NEBU: 2.5000 mg | INHALATION_SOLUTION | RESPIRATORY_TRACT | Status: DC

## 2018-10-31 MED ORDER — HYDROCHLOROTHIAZIDE 12.5 MG PO CAPS
12.5000 mg | ORAL_CAPSULE | Freq: Every day | ORAL | Status: DC
  Administered 2018-11-01: 10:00:00 12.5 mg via ORAL
  Filled 2018-10-31: qty 1

## 2018-10-31 MED ORDER — ROCURONIUM BROMIDE 10 MG/ML (PF) SYRINGE
PREFILLED_SYRINGE | INTRAVENOUS | Status: DC | PRN
  Administered 2018-10-31: 20 mg via INTRAVENOUS
  Administered 2018-10-31: 10 mg via INTRAVENOUS
  Administered 2018-10-31: 20 mg via INTRAVENOUS
  Administered 2018-10-31: 50 mg via INTRAVENOUS

## 2018-10-31 MED ORDER — PANTOPRAZOLE SODIUM 40 MG PO TBEC
40.0000 mg | DELAYED_RELEASE_TABLET | Freq: Every evening | ORAL | Status: DC
  Administered 2018-10-31: 40 mg via ORAL
  Filled 2018-10-31: qty 1

## 2018-10-31 MED ORDER — MIDAZOLAM HCL 2 MG/2ML IJ SOLN
INTRAMUSCULAR | Status: AC
  Filled 2018-10-31: qty 4

## 2018-10-31 MED ORDER — SODIUM CHLORIDE 0.9 % IV SOLN
INTRAVENOUS | Status: DC | PRN
  Administered 2018-10-31: 20 ug/min via INTRAVENOUS

## 2018-10-31 MED ORDER — SODIUM CHLORIDE 0.9 % IV SOLN
INTRAVENOUS | Status: DC
  Administered 2018-10-31 – 2018-11-01 (×2): via INTRAVENOUS

## 2018-10-31 MED ORDER — SUGAMMADEX SODIUM 200 MG/2ML IV SOLN
INTRAVENOUS | Status: DC | PRN
  Administered 2018-10-31: 350 mg via INTRAVENOUS

## 2018-10-31 MED ORDER — MIDAZOLAM HCL 5 MG/5ML IJ SOLN
INTRAMUSCULAR | Status: DC | PRN
  Administered 2018-10-31: 2 mg via INTRAVENOUS

## 2018-10-31 MED ORDER — LACTATED RINGERS IV SOLN
INTRAVENOUS | Status: DC
  Administered 2018-10-31: 09:00:00 via INTRAVENOUS

## 2018-10-31 MED ORDER — ALBUTEROL SULFATE (2.5 MG/3ML) 0.083% IN NEBU
INHALATION_SOLUTION | RESPIRATORY_TRACT | Status: AC
  Administered 2018-10-31: 08:00:00
  Filled 2018-10-31: qty 3

## 2018-10-31 MED ORDER — PROMETHAZINE HCL 25 MG/ML IJ SOLN: 6.2500 mg | INTRAMUSCULAR | Status: DC | PRN

## 2018-10-31 MED ORDER — PROPOFOL 10 MG/ML IV BOLUS
INTRAVENOUS | Status: DC | PRN
  Administered 2018-10-31: 140 mg via INTRAVENOUS

## 2018-10-31 MED ORDER — MEPERIDINE HCL 50 MG/ML IJ SOLN: 6.2500 mg | INTRAMUSCULAR | Status: DC | PRN

## 2018-10-31 MED ORDER — AMLODIPINE BESYLATE 10 MG PO TABS
10.0000 mg | ORAL_TABLET | Freq: Every day | ORAL | Status: DC
  Administered 2018-10-31: 10 mg via ORAL
  Filled 2018-10-31: qty 1

## 2018-10-31 MED ORDER — BUPROPION HCL ER (XL) 300 MG PO TB24
300.0000 mg | ORAL_TABLET | Freq: Every day | ORAL | Status: DC
  Administered 2018-10-31: 300 mg via ORAL
  Filled 2018-10-31: qty 1

## 2018-10-31 MED ORDER — PHENYLEPHRINE 40 MCG/ML (10ML) SYRINGE FOR IV PUSH (FOR BLOOD PRESSURE SUPPORT)
PREFILLED_SYRINGE | INTRAVENOUS | Status: DC | PRN
  Administered 2018-10-31 (×2): 120 ug via INTRAVENOUS
  Administered 2018-10-31: 160 ug via INTRAVENOUS
  Administered 2018-10-31: 120 ug via INTRAVENOUS
  Administered 2018-10-31: 160 ug via INTRAVENOUS
  Administered 2018-10-31: 80 ug via INTRAVENOUS

## 2018-10-31 MED ORDER — ACETAMINOPHEN 10 MG/ML IV SOLN: 1000.0000 mg | Freq: Once | INTRAVENOUS | Status: DC | PRN

## 2018-10-31 MED ORDER — LOSARTAN POTASSIUM 50 MG PO TABS
100.0000 mg | ORAL_TABLET | Freq: Every day | ORAL | Status: DC
  Administered 2018-11-01: 10:00:00 100 mg via ORAL
  Filled 2018-10-31: qty 2

## 2018-10-31 MED ORDER — FENTANYL CITRATE (PF) 100 MCG/2ML IJ SOLN
INTRAMUSCULAR | Status: DC | PRN
  Administered 2018-10-31 (×3): 50 ug via INTRAVENOUS

## 2018-10-31 MED ORDER — DEXAMETHASONE SODIUM PHOSPHATE 10 MG/ML IJ SOLN
INTRAMUSCULAR | Status: DC | PRN
  Administered 2018-10-31: 10 mg via INTRAVENOUS

## 2018-10-31 MED ORDER — LIDOCAINE 2% (20 MG/ML) 5 ML SYRINGE
INTRAMUSCULAR | Status: DC | PRN
  Administered 2018-10-31: 60 mg via INTRAVENOUS

## 2018-10-31 MED ORDER — ONDANSETRON HCL 4 MG/2ML IJ SOLN: 4.0000 mg | Freq: Four times a day (QID) | INTRAMUSCULAR | Status: DC | PRN

## 2018-10-31 MED ORDER — ALBUTEROL SULFATE HFA 108 (90 BASE) MCG/ACT IN AERS
INHALATION_SPRAY | RESPIRATORY_TRACT | Status: DC | PRN
  Administered 2018-10-31: 2 via RESPIRATORY_TRACT

## 2018-10-31 MED ORDER — IOHEXOL 350 MG/ML SOLN
100.0000 mL | Freq: Once | INTRAVENOUS | Status: AC | PRN
  Administered 2018-10-31: 11:00:00 70 mL via INTRAVENOUS

## 2018-10-31 MED ORDER — SUCCINYLCHOLINE CHLORIDE 200 MG/10ML IV SOSY
PREFILLED_SYRINGE | INTRAVENOUS | Status: DC | PRN
  Administered 2018-10-31: 140 mg via INTRAVENOUS

## 2018-10-31 MED ORDER — LACTATED RINGERS IV SOLN
INTRAVENOUS | Status: DC
  Administered 2018-10-31: 08:00:00 via INTRAVENOUS

## 2018-10-31 MED ORDER — DOCUSATE SODIUM 100 MG PO CAPS
100.0000 mg | ORAL_CAPSULE | Freq: Two times a day (BID) | ORAL | Status: DC
  Administered 2018-10-31 – 2018-11-01 (×2): 100 mg via ORAL
  Filled 2018-10-31 (×2): qty 1

## 2018-10-31 MED ORDER — ACETAMINOPHEN 160 MG/5ML PO SOLN: 325.0000 mg | Freq: Once | ORAL | Status: DC | PRN

## 2018-10-31 MED ORDER — FENTANYL CITRATE (PF) 250 MCG/5ML IJ SOLN
INTRAMUSCULAR | Status: AC
  Filled 2018-10-31: qty 10

## 2018-10-31 MED ORDER — CEFAZOLIN SODIUM-DEXTROSE 2-4 GM/100ML-% IV SOLN: 2.0000 g | Freq: Once | INTRAVENOUS | Status: DC

## 2018-10-31 MED ORDER — LACTATED RINGERS IV SOLN: INTRAVENOUS | Status: DC

## 2018-10-31 NOTE — Anesthesia Postprocedure Evaluation (Signed)
Anesthesia Post Note  Patient: [name]  Procedure(s) Performed: CT WITH ANESTHESIA RENAL CRYO ABLATION (Left )     Patient location during evaluation: PACU Anesthesia Type: General Level of consciousness: awake and alert Pain management: pain level controlled Vital Signs Assessment: post-procedure vital signs reviewed and stable Respiratory status: spontaneous breathing, nonlabored ventilation, respiratory function stable and patient connected to nasal cannula oxygen Cardiovascular status: blood pressure returned to baseline and stable Postop Assessment: no apparent nausea or vomiting Anesthetic complications: no    Last Vitals:  Vitals:   10/31/18 1300 10/31/18 1315  BP: 113/61 (!) 108/52  Pulse: 74 71  Resp: (!) 21 (!) 21  Temp:    SpO2: 99% 98%    Last Pain:  Vitals:   10/31/18 1300  TempSrc:   PainSc: Asleep                 Effie Berkshire

## 2018-10-31 NOTE — Anesthesia Procedure Notes (Signed)
Procedure Name: Intubation Date/Time: 10/31/2018 8:52 AM Performed by: Silas Sacramento, CRNA Pre-anesthesia Checklist: Patient identified, Emergency Drugs available, Suction available and Patient being monitored Patient Re-evaluated:Patient Re-evaluated prior to induction Oxygen Delivery Method: Circle system utilized Preoxygenation: Pre-oxygenation with 100% oxygen Induction Type: IV induction and Rapid sequence Ventilation: Mask ventilation without difficulty Laryngoscope Size: Mac and 4 Grade View: Grade I Tube type: Oral Tube size: 7.5 mm Number of attempts: 1 Airway Equipment and Method: Stylet and Oral airway Placement Confirmation: ETT inserted through vocal cords under direct vision,  positive ETCO2 and breath sounds checked- equal and bilateral Secured at: 24 cm Tube secured with: Tape Dental Injury: Teeth and Oropharynx as per pre-operative assessment

## 2018-10-31 NOTE — H&P (Signed)
Chief Complaint: Patient was seen in consultation today for renal mass  Referring Physician(s): Dr. [name]  Supervising Physician: Aletta Edouard  Patient Status: [facility] - Out-pt  History of Present Illness: Reginald Berry is a 68 y.o. male with past medical history of GERD, HTN, small cell lung cancer, left renal mass presents to [facility] Radiology today for renal mass cryoablation procedure found during extensive work-up and ongoing treatment of his lung cancer.  Patient met with Dr. Kathlene Cote via TeleHealth consultation on 09/27/18 and elects to proceed with intervention today.   Patient presents in his usual state of health today.  He denies fever, chills, nausea, vomiting, shortness of breath, cough, hematuria. He is extremely hard of hearing.  He does have chronic swelling in his legs bilaterally.  He does not take blood thinners.  He has been NPO.     Past Medical History:  Diagnosis Date  . Arthritis    hands  . Dyspnea    with activity  . GERD (gastroesophageal reflux disease)    at times  . Hypertension   . Left renal mass   . Lung cancer ([place])    Small cell  . Thyroid cyst    "Growth"    Past Surgical History:  Procedure Laterality Date  . CHOLECYSTECTOMY     2019  . FRACTURE SURGERY     bil legs ([place])  . IR RADIOLOGIST EVAL & MGMT  09/27/2018  . NECK SURGERY  1994-1995    Allergies: Patient has no known allergies.  Medications: Prior to Admission medications   Medication Sig Start Date End Date Taking? Authorizing Provider  amLODipine (NORVASC) 10 MG tablet Take 10 mg by mouth daily.    [provider]  aspirin 81 MG EC tablet Take 81 mg by mouth daily. Swallow whole.    [provider]  buPROPion (WELLBUTRIN XL) 300 MG 24 hr tablet Take 300 mg by mouth daily. 07/26/18   [provider]  busPIRone (BUSPAR) 15 MG tablet Take 15 mg by mouth daily. 07/26/18   [provider]  cetirizine (ZYRTEC) 10 MG  tablet Take 10 mg by mouth daily as needed for allergies.    [provider]  furosemide (LASIX) 40 MG tablet Take 40 mg by mouth daily.     [provider]  gabapentin (NEURONTIN) 300 MG capsule Take 1 capsule (300 mg total) by mouth 3 (three) times daily. Patient not taking: Reported on 10/12/2018 06/11/18   [name], PA-C  hydrochlorothiazide (MICROZIDE) 12.5 MG capsule Take 12.5 mg by mouth daily.    [provider]  liothyronine (CYTOMEL) 25 MCG tablet Take 25 mcg by mouth daily. 07/26/18   [provider]  losartan (COZAAR) 100 MG tablet Take 100 mg by mouth daily. 07/26/18   [provider]  Multiple Vitamins-Minerals (CENTRUM SILVER 50+MEN) TABS Take 1 tablet by mouth daily.    [provider]  oxyCODONE-acetaminophen (PERCOCET) 10-325 MG tablet Take 1 tablet by mouth every 4 (four) hours as needed for pain.    [provider]  pantoprazole (PROTONIX) 40 MG tablet Take 40 mg by mouth every evening.     [provider]  Probiotic Product (PROBIOTIC DAILY PO) Take 1 capsule by mouth daily.    [provider]  sildenafil (VIAGRA) 100 MG tablet Take 100 mg by mouth daily as needed for erectile dysfunction.    [provider]     No family history on file.  Social History   Socioeconomic History  . Marital status: Single    Spouse name: Not on file  . Number of children: Not on file  . Years of education: Not on file  . Highest education level: Not on file  Occupational History  . Not on file  Social Needs  . Financial resource strain: Not on file  . Food insecurity    Worry: Not on file    Inability: Not on file  . Transportation needs    Medical: Not on file    Non-medical: Not on file  Tobacco Use  . Smoking status: Current Every Day Smoker    Packs/day: 0.50  . Smokeless tobacco: Never Used  . Tobacco comment: 3 a day  Substance and Sexual Activity  . Alcohol use: Never     Frequency: Never    Comment: BEER ONCE IN A WHILE   . Drug use: Never  . Sexual activity: Not Currently  Lifestyle  . Physical activity    Days per week: Not on file    Minutes per session: Not on file  . Stress: Not on file  Relationships  . Social Herbalist on phone: Not on file    Gets together: Not on file    Attends religious service: Not on file    Active member of club or organization: Not on file    Attends meetings of clubs or organizations: Not on file    Relationship status: Not on file  Other Topics Concern  . Not on file  Social History Narrative   10-23-17 Unable to ask abuse questions wife and other family with him today.     Review of Systems: A 12 point ROS discussed and pertinent positives are indicated in the HPI above.  All other systems are negative.  Review of Systems  Constitutional: Negative for fatigue and fever.  Respiratory: Negative for cough and shortness of breath.   Cardiovascular: Negative for chest pain.  Gastrointestinal: Negative for abdominal pain.  Genitourinary: Negative for dysuria and hematuria.  Musculoskeletal: Negative for back pain.  Psychiatric/Behavioral: Negative for behavioral problems and confusion.    Vital Signs: There were no vitals taken for this visit.  Physical Exam Vitals signs and nursing note reviewed.  Constitutional:      Appearance: Normal appearance.  HENT:     Mouth/Throat:     Mouth: Mucous membranes are moist.     Pharynx: Oropharynx is clear.  Cardiovascular:     Rate and Rhythm: Normal rate and regular rhythm.     Pulses: Normal pulses.  Pulmonary:     Effort: Pulmonary effort is normal. No respiratory distress.  Abdominal:     General: There is no distension.     Palpations: Abdomen is soft.  Skin:    General: Skin is warm and dry.  Neurological:     General: No focal deficit present.     Mental Status: He is alert and oriented to person, place, and time. Mental status is at  baseline.  Psychiatric:        Mood and Affect: Mood normal.        Behavior: Behavior normal.        Thought Content: Thought content normal.        Judgment: Judgment normal.      MD Evaluation Airway: WNL Heart: WNL Abdomen: WNL Chest/ Lungs: WNL ASA  Classification: 3 Mallampati/Airway Score: Two   Imaging: Dg Chest 1 View  Result Date:  10/26/2018 CLINICAL DATA:  Preoperative for left renal mass ablation. EXAM: CHEST  1 VIEW COMPARISON:  08/10/2017 chest radiograph.  08/16/2018 chest CT. FINDINGS: Stable cardiomediastinal silhouette with normal heart size. No pneumothorax. No pleural effusion. Irregular apical left lung opacity. No pulmonary edema. No acute consolidative airspace disease. IMPRESSION: Irregular apical left lung opacity, correlating with the site of radiation change described on 08/16/2018 chest CT. Continued routine chest CT follow-up advised. Otherwise no active cardiopulmonary disease. Electronically Signed   By: Ilona Sorrel M.D.   On: 10/26/2018 16:57    Labs:  CBC: Recent Labs    10/26/18 1219 10/31/18 0659  WBC 10.0 7.4  HGB 16.2 15.6  HCT 49.0 47.2  PLT 266 270    COAGS: Recent Labs    10/26/18 1219  INR 0.9    BMP: Recent Labs    08/16/18 1621 09/12/18 1105 10/26/18 1219 10/31/18 0659  NA  --   --  135 133*  K  --   --  4.6 4.1  CL  --   --  102 104  CO2  --   --  21* 20*  GLUCOSE  --   --  106* 103*  BUN  --  13 13 17   CALCIUM  --   --  9.4 8.7*  CREATININE 1.00 1.08 0.96 1.10  GFRNONAA  --  >60 >60 >60  GFRAA  --  >60 >60 >60    LIVER FUNCTION TESTS: No results for input(s): BILITOT, AST, ALT, ALKPHOS, PROT, ALBUMIN in the last 8760 hours.  TUMOR MARKERS: No results for input(s): AFPTM, CEA, CA199, CHROMGRNA in the last 8760 hours.  Assessment and Plan: Patient with past medical history of HTN, GERD, lung cancer presents with complaint of renal mass.  IR consulted for left renal mass cryoablation at the request of  Dr. [name]. Case reviewed by Dr. Kathlene Cote who approves patient for procedure and has met with patient in consultation.  Patient presents today in their usual state of health.  He has been NPO and is not currently on blood thinners.    Risks and benefits discussed with the patient including, but not limited to failure to treat entire lesion, bleeding, infection, damage to adjacent structures, decrease in renal function or post procedural neuropathy.  All of the patient's questions were answered, patient is agreeable to proceed. Consent signed and in chart.   Thank you for this interesting consult.  I greatly enjoyed meeting Reginald Berry and look forward to participating in their care.  A copy of this report was sent to the requesting provider on this date.  Electronically Signed: Docia Barrier, PA 10/31/2018, 8:26 AM   I spent a total of  30 Minutes   in face to face in clinical consultation, greater than 50% of which was counseling/coordinating care for renal mass.

## 2018-10-31 NOTE — H&P (Deleted)
  The note originally documented on this encounter has been moved the the encounter in which it belongs.  

## 2018-10-31 NOTE — Progress Notes (Signed)
Patient assessed alongside Dr. Kathlene Cote after left renal cryoablation procedure today.    Patient resting comfortably in bed.  Denies pain. Sipping on water.  Clear, yellow urine in foley. Regular diet has been ordered.  Home meds reconciled.   To remain in observation overnight.  For possible discharge tomorrow morning.   Brynda Greathouse, MS RD PA-C 4:39 PM

## 2018-10-31 NOTE — Transfer of Care (Signed)
Immediate Anesthesia Transfer of Care Note  Patient: Reginald Berry  Procedure(s) Performed: CT WITH ANESTHESIA RENAL CRYO ABLATION (Left )  Patient Location: PACU  Anesthesia Type:General  Level of Consciousness: drowsy, patient cooperative and responds to stimulation  Airway & Oxygen Therapy: Patient Spontanous Breathing and Patient connected to face mask oxygen  Post-op Assessment: Report given to RN and Post -op Vital signs reviewed and stable  Post vital signs: Reviewed and stable  Last Vitals:  Vitals Value Taken Time  BP 134/63 10/31/18 1155  Temp    Pulse 79 10/31/18 1158  Resp 33 10/31/18 1158  SpO2 100 % 10/31/18 1158  Vitals shown include unvalidated device data.  Last Pain:  Vitals:   10/31/18 0720  TempSrc:   PainSc: 3          Complications: No apparent anesthesia complications

## 2018-10-31 NOTE — Procedures (Signed)
Interventional Radiology Procedure Note  Procedure: CT guided biopsy and cryoablation of left renal mass  Anesthesia:  General  Complications: None  Contrast: 70 mL Omni 350 IV  Estimated Blood Loss: < 10 mL  Findings: Left posterior interpolar renal mass now approximately 2 cm in diameter. 18 G core biopsy x 1 via 17 G needle. Cryoablation with 2 Galil Ice Pearl CX probes.   Plan: PACU recovery followed by overnight observation.  Venetia Night. Kathlene Cote, M.D Pager:  [phone]

## 2018-10-31 NOTE — Sedation Documentation (Signed)
Anesthesia in to sedate and monitor. 

## 2018-11-01 ENCOUNTER — Encounter (HOSPITAL_COMMUNITY): Payer: Self-pay | Admitting: Interventional Radiology

## 2018-11-01 DIAGNOSIS — K219 Gastro-esophageal reflux disease without esophagitis: Secondary | ICD-10-CM | POA: Diagnosis not present

## 2018-11-01 DIAGNOSIS — I10 Essential (primary) hypertension: Secondary | ICD-10-CM | POA: Diagnosis not present

## 2018-11-01 DIAGNOSIS — Z79899 Other long term (current) drug therapy: Secondary | ICD-10-CM | POA: Diagnosis not present

## 2018-11-01 DIAGNOSIS — N2889 Other specified disorders of kidney and ureter: Secondary | ICD-10-CM | POA: Diagnosis not present

## 2018-11-01 DIAGNOSIS — Z85118 Personal history of other malignant neoplasm of bronchus and lung: Secondary | ICD-10-CM | POA: Diagnosis not present

## 2018-11-01 DIAGNOSIS — C642 Malignant neoplasm of left kidney, except renal pelvis: Secondary | ICD-10-CM | POA: Diagnosis not present

## 2018-11-01 LAB — CBC
HCT: 44.8 % (ref 39.0–52.0)
Hemoglobin: 14.5 g/dL (ref 13.0–17.0)
MCH: 29 pg (ref 26.0–34.0)
MCHC: 32.4 g/dL (ref 30.0–36.0)
MCV: 89.6 fL (ref 80.0–100.0)
Platelets: 263 10*3/uL (ref 150–400)
RBC: 5 MIL/uL (ref 4.22–5.81)
RDW: 14.8 % (ref 11.5–15.5)
WBC: 12.3 10*3/uL — ABNORMAL HIGH (ref 4.0–10.5)
nRBC: 0 % (ref 0.0–0.2)

## 2018-11-01 LAB — BASIC METABOLIC PANEL
Anion gap: 9 (ref 5–15)
BUN: 20 mg/dL (ref 8–23)
CO2: 20 mmol/L — ABNORMAL LOW (ref 22–32)
Calcium: 8.7 mg/dL — ABNORMAL LOW (ref 8.9–10.3)
Chloride: 103 mmol/L (ref 98–111)
Creatinine, Ser: 1.31 mg/dL — ABNORMAL HIGH (ref 0.61–1.24)
GFR calc Af Amer: >60 mL/min (ref >60)
GFR calc non Af Amer: 56 mL/min — ABNORMAL LOW (ref >60)
Glucose, Bld: 127 mg/dL — ABNORMAL HIGH (ref 70–99)
Potassium: 4.4 mmol/L (ref 3.5–5.1)
Sodium: 132 mmol/L — ABNORMAL LOW (ref 135–145)

## 2018-11-01 NOTE — Progress Notes (Signed)
Pt was discharged home today. Instructions were reviewed with patient, and questions were answered. Pt was taken to main entrance via wheelchair by NT.  

## 2018-11-01 NOTE — Progress Notes (Signed)
Pt mews yellow. On call Radiologist called and RN spoke with Daryll Brod about pts pulse, BP, resp and notified that he has dyspnea with exertion but pt states this is normal for him. No new orders given and MD states to continue observation. Pt currently resting. Yellow mews started per protocol.

## 2018-11-01 NOTE — Discharge Summary (Signed)
Patient ID: MAE CIANCI MRN: [id number] DOB/AGE: 1950/05/17 68 y.o.  Admit date: 10/31/2018 Discharge date: 11/01/2018  Supervising Physician: Aletta Edouard  Patient Status: [facility] - In-pt  Admission Diagnoses: Left renal mass  Discharge Diagnoses:  Active Problems:   Left renal mass   Discharged Condition: stable  Hospital Course:  Patient presented to [facility] 10/31/2018 for an image-guided left renal mass biopsy and cryoablation by Dr. Kathlene Cote. Procedure occurred without major complications and patient was transferred to floor in stable condition (VSS, left flank incisions stable) for overnight observation. No major events occurred overnight.  Patient awake and alert sitting in bed eating breakfast with no complaints at this time. Denies flank pain or hematuria. Left flank incisions stable. Plan to discharge home today and follow-up with Dr. Kathlene Cote in clinic for virtual visit in 3-4 weeks.   Consults: None  Significant Diagnostic Studies: Dg Chest 1 View  Result Date: 10/26/2018 CLINICAL DATA:  Preoperative for left renal mass ablation. EXAM: CHEST  1 VIEW COMPARISON:  08/10/2017 chest radiograph.  08/16/2018 chest CT. FINDINGS: Stable cardiomediastinal silhouette with normal heart size. No pneumothorax. No pleural effusion. Irregular apical left lung opacity. No pulmonary edema. No acute consolidative airspace disease. IMPRESSION: Irregular apical left lung opacity, correlating with the site of radiation change described on 08/16/2018 chest CT. Continued routine chest CT follow-up advised. Otherwise no active cardiopulmonary disease. Electronically Signed   By: Ilona Sorrel M.D.   On: 10/26/2018 16:57   Ct Guide Tissue Ablation  Result Date: 10/31/2018 CLINICAL DATA:  2.6 cm left renal mass. The patient presents for biopsy and cryoablation of the mass. EXAM: CT-GUIDED CORE BIOPSY OF LEFT RENAL MASS CT-GUIDED PERCUTANEOUS CRYOABLATION OF LEFT RENAL MASS ANESTHESIA/SEDATION:  General MEDICATIONS: 2 g IV Ancef. The antibiotic was administered in an appropriate time interval prior to needle puncture of the skin. CONTRAST:  62mL OMNIPAQUE IOHEXOL 350 MG/ML SOLN PROCEDURE: The procedure, risks, benefits, and alternatives were explained to the patient. Questions regarding the procedure were encouraged and answered. The patient understands and consents to the procedure. The patient was placed under general anesthesia. Initial unenhanced CT was performed in a prone position to localize the left renal mass. The left flank region was prepped with chlorhexidine in a sterile fashion, and a sterile drape was applied covering the operative field. A sterile gown and sterile gloves were used for the procedure. A time-out was performed prior to initiating the procedure. A 17 gauge trocar needle was advanced into the left renal mass. Core biopsy was performed with an 18 gauge automated core biopsy device. A single core biopsy sample was submitted in formalin for pathologic analysis. Under CT guidance, 2 separate David Stall CX percutaneous cryoablation probes were advanced into the left renal mass. Probe positioning was confirmed by CT prior to cryoablation. Cryoablation was performed through the 2 probes simultaneously. Initial 10 minute cycle of cryoablation was performed. This was followed by a 8 minute thaw cycle. A second 10 minute cycle of cryoablation was then performed. During ablation, periodic CT imaging was performed to monitor ice ball formation and morphology. After active thaw and 30 second cautery cycle, the cryoablation probes were removed. COMPLICATIONS: None FINDINGS: CT demonstrates a partially exophytic posterior interpolar mass of the left kidney. In order to fully defined borders of the mass and allow accurate probe placement, IV contrast was administered. This demonstrates stable size of the mass which measures approximately 2.6 cm in greatest diameter. Core biopsy yielded  fragmented tissue. During cryoablation,  CT demonstrates adequate ice ball formation which encompasses the mass. There was no evidence hemorrhage during the procedure. IMPRESSION: CT guided core biopsy and percutaneous cryoablation of a 2.6 cm left renal mass. The patient will be observed overnight. Electronically Signed   By: Aletta Edouard M.D.   On: 10/31/2018 14:08   Ct Biopsy  Result Date: 10/31/2018 CLINICAL DATA:  2.6 cm left renal mass. The patient presents for biopsy and cryoablation of the mass. EXAM: CT-GUIDED CORE BIOPSY OF LEFT RENAL MASS CT-GUIDED PERCUTANEOUS CRYOABLATION OF LEFT RENAL MASS ANESTHESIA/SEDATION: General MEDICATIONS: 2 g IV Ancef. The antibiotic was administered in an appropriate time interval prior to needle puncture of the skin. CONTRAST:  45mL OMNIPAQUE IOHEXOL 350 MG/ML SOLN PROCEDURE: The procedure, risks, benefits, and alternatives were explained to the patient. Questions regarding the procedure were encouraged and answered. The patient understands and consents to the procedure. The patient was placed under general anesthesia. Initial unenhanced CT was performed in a prone position to localize the left renal mass. The left flank region was prepped with chlorhexidine in a sterile fashion, and a sterile drape was applied covering the operative field. A sterile gown and sterile gloves were used for the procedure. A time-out was performed prior to initiating the procedure. A 17 gauge trocar needle was advanced into the left renal mass. Core biopsy was performed with an 18 gauge automated core biopsy device. A single core biopsy sample was submitted in formalin for pathologic analysis. Under CT guidance, 2 separate David Stall CX percutaneous cryoablation probes were advanced into the left renal mass. Probe positioning was confirmed by CT prior to cryoablation. Cryoablation was performed through the 2 probes simultaneously. Initial 10 minute cycle of cryoablation was performed.  This was followed by a 8 minute thaw cycle. A second 10 minute cycle of cryoablation was then performed. During ablation, periodic CT imaging was performed to monitor ice ball formation and morphology. After active thaw and 30 second cautery cycle, the cryoablation probes were removed. COMPLICATIONS: None FINDINGS: CT demonstrates a partially exophytic posterior interpolar mass of the left kidney. In order to fully defined borders of the mass and allow accurate probe placement, IV contrast was administered. This demonstrates stable size of the mass which measures approximately 2.6 cm in greatest diameter. Core biopsy yielded fragmented tissue. During cryoablation, CT demonstrates adequate ice ball formation which encompasses the mass. There was no evidence hemorrhage during the procedure. IMPRESSION: CT guided core biopsy and percutaneous cryoablation of a 2.6 cm left renal mass. The patient will be observed overnight. Electronically Signed   By: Aletta Edouard M.D.   On: 10/31/2018 14:08    Treatments: Percutaneous cryoablation of left renal mass.  Discharge Exam: Blood pressure 136/76, pulse 78, temperature 98.1 F (36.7 C), temperature source Oral, resp. rate 20, height 6\' 2"  (1.88 m), weight (!) 339 lb (153.8 kg), SpO2 97 %. Physical Exam Vitals signs and nursing note reviewed.  Constitutional:      General: He is not in acute distress.    Appearance: Normal appearance.  Cardiovascular:     Rate and Rhythm: Normal rate and regular rhythm.     Heart sounds: Normal heart sounds. No murmur.  Pulmonary:     Effort: Pulmonary effort is normal. No respiratory distress.     Breath sounds: Normal breath sounds. No wheezing.  Skin:    General: Skin is warm and dry.     Comments: Left flank incision soft without tenderness, erythema, drainage, or active bleeding.  Neurological:     Mental Status: He is alert and oriented to person, place, and time.  Psychiatric:        Mood and Affect: Mood  normal.        Behavior: Behavior normal.        Thought Content: Thought content normal.        Judgment: Judgment normal.     Disposition: Discharge disposition: 01-Home or Self Care       Discharge Instructions    Call MD for:  difficulty breathing, headache or visual disturbances   Complete by: As directed    Call MD for:  extreme fatigue   Complete by: As directed    Call MD for:  hives   Complete by: As directed    Call MD for:  persistant dizziness or light-headedness   Complete by: As directed    Call MD for:  persistant nausea and vomiting   Complete by: As directed    Call MD for:  redness, tenderness, or signs of infection (pain, swelling, redness, odor or green/yellow discharge around incision site)   Complete by: As directed    Call MD for:  severe uncontrolled pain   Complete by: As directed    Call MD for:  temperature >100.4   Complete by: As directed    Diet - low sodium heart healthy   Complete by: As directed    Discharge instructions   Complete by: As directed    No showering for 48 hours post-procedure. No submerging (bathing, swimming) for 1 week post-procedure.   Increase activity slowly   Complete by: As directed      Allergies as of 11/01/2018   No Known Allergies     Medication List    TAKE these medications   amLODipine 10 MG tablet Commonly known as: NORVASC Take 10 mg by mouth daily.   aspirin 81 MG EC tablet Take 81 mg by mouth daily. Swallow whole.   buPROPion 300 MG 24 hr tablet Commonly known as: WELLBUTRIN XL Take 300 mg by mouth daily.   busPIRone 15 MG tablet Commonly known as: BUSPAR Take 15 mg by mouth daily.   Centrum Silver 50+Men Tabs Take 1 tablet by mouth daily.   cetirizine 10 MG tablet Commonly known as: ZYRTEC Take 10 mg by mouth daily as needed for allergies.   furosemide 40 MG tablet Commonly known as: LASIX Take 40 mg by mouth daily.   gabapentin 300 MG capsule Commonly known as: NEURONTIN Take  1 capsule (300 mg total) by mouth 3 (three) times daily.   hydrochlorothiazide 12.5 MG capsule Commonly known as: MICROZIDE Take 12.5 mg by mouth daily.   liothyronine 25 MCG tablet Commonly known as: CYTOMEL Take 25 mcg by mouth daily.   losartan 100 MG tablet Commonly known as: COZAAR Take 100 mg by mouth daily.   oxyCODONE-acetaminophen 10-325 MG tablet Commonly known as: PERCOCET Take 1 tablet by mouth every 4 (four) hours as needed for pain.   pantoprazole 40 MG tablet Commonly known as: PROTONIX Take 40 mg by mouth every evening.   PROBIOTIC DAILY PO Take 1 capsule by mouth daily.   sildenafil 100 MG tablet Commonly known as: VIAGRA Take 100 mg by mouth daily as needed for erectile dysfunction.      Follow-up Information    Aletta Edouard, MD Follow up in 4 week(s).   Specialties: Interventional Radiology, Radiology Why: Please follow-up with Dr. Kathlene Cote for a virtual visit 3-4 weeks after discharge.  Our office will call you to set up this appointment. Contact information: [address] [place] [address] [phone]            Electronically Signed: Earley Abide, PA-C 11/01/2018, 10:06 AM   I have spent Less Than 30 Minutes discharging Reginald Berry.

## 2018-11-02 DIAGNOSIS — I10 Essential (primary) hypertension: Secondary | ICD-10-CM | POA: Diagnosis not present

## 2018-11-02 DIAGNOSIS — Z79891 Long term (current) use of opiate analgesic: Secondary | ICD-10-CM | POA: Diagnosis not present

## 2018-11-02 DIAGNOSIS — K219 Gastro-esophageal reflux disease without esophagitis: Secondary | ICD-10-CM | POA: Diagnosis not present

## 2018-11-02 DIAGNOSIS — J449 Chronic obstructive pulmonary disease, unspecified: Secondary | ICD-10-CM | POA: Diagnosis not present

## 2018-11-05 DIAGNOSIS — Z0001 Encounter for general adult medical examination with abnormal findings: Secondary | ICD-10-CM | POA: Diagnosis not present

## 2018-11-05 DIAGNOSIS — Z1212 Encounter for screening for malignant neoplasm of rectum: Secondary | ICD-10-CM | POA: Diagnosis not present

## 2018-11-05 DIAGNOSIS — J449 Chronic obstructive pulmonary disease, unspecified: Secondary | ICD-10-CM | POA: Diagnosis not present

## 2018-11-05 DIAGNOSIS — Z23 Encounter for immunization: Secondary | ICD-10-CM | POA: Diagnosis not present

## 2018-11-05 DIAGNOSIS — I10 Essential (primary) hypertension: Secondary | ICD-10-CM | POA: Diagnosis not present

## 2018-11-05 DIAGNOSIS — G252 Other specified forms of tremor: Secondary | ICD-10-CM | POA: Diagnosis not present

## 2018-11-22 ENCOUNTER — Other Ambulatory Visit: Payer: Self-pay

## 2018-11-22 ENCOUNTER — Encounter: Payer: Self-pay | Admitting: *Deleted

## 2018-11-22 ENCOUNTER — Ambulatory Visit
Admission: RE | Admit: 2018-11-22 | Discharge: 2018-11-22 | Disposition: A | Discharge status: Home / Self Care | Payer: Medicare Other | Source: Ambulatory Visit | Attending: Student | Admitting: Student

## 2018-11-22 DIAGNOSIS — N2889 Other specified disorders of kidney and ureter: Secondary | ICD-10-CM

## 2018-11-22 DIAGNOSIS — C642 Malignant neoplasm of left kidney, except renal pelvis: Secondary | ICD-10-CM | POA: Diagnosis not present

## 2018-11-22 HISTORY — PX: IR RADIOLOGIST EVAL & MGMT: IMG5224

## 2018-11-22 NOTE — Progress Notes (Signed)
Chief Complaint: Patient was consulted remotely today (TeleHealth) for follow up after cryoablation of a left renal carcinoma on 10/31/2018.  History of Present Illness: Reginald Berry is a 68 y.o. male status post cryoablation of a 2.6 cm posterior exophytic interpolar mass of the left kidney 3 weeks ago. Biopsy at the time of ablation demonstrated clear cell renal carcinoma, nuclear grade 2. He did very well after the procedure and overnight observation with no complaints. He has had no urinary complaints or flank pain since the procedure. Creatinine did bump slightly from 1.1 to 1.3 after the procedure.  Past Medical History:  Diagnosis Date   Arthritis    hands   Dyspnea    with activity   GERD (gastroesophageal reflux disease)    at times   Hypertension    Left renal mass    Lung cancer ([place])    Small cell   Thyroid cyst    "Growth"    Past Surgical History:  Procedure Laterality Date   CHOLECYSTECTOMY     2019   FRACTURE SURGERY     bil legs (logging trees)   IR RADIOLOGIST EVAL & MGMT  09/27/2018   NECK SURGERY  1994-1995   RADIOLOGY WITH ANESTHESIA Left 10/31/2018   Procedure: CT WITH ANESTHESIA RENAL CRYO ABLATION;  Surgeon: Aletta Edouard, MD;  Location: [facility];  Service: Radiology;  Laterality: Left;    Allergies: Patient has no known allergies.  Medications: Prior to Admission medications   Medication Sig Start Date End Date Taking? Authorizing Provider  amLODipine (NORVASC) 10 MG tablet Take 10 mg by mouth daily.    [provider]  aspirin 81 MG EC tablet Take 81 mg by mouth daily. Swallow whole.    [provider]  buPROPion (WELLBUTRIN XL) 300 MG 24 hr tablet Take 300 mg by mouth daily. 07/26/18   [provider]  busPIRone (BUSPAR) 15 MG tablet Take 15 mg by mouth daily. 07/26/18   [provider]  cetirizine (ZYRTEC) 10 MG tablet Take 10 mg by mouth daily as needed for allergies.    [provider]  furosemide (LASIX) 40 MG tablet Take 40 mg by mouth daily.     [provider]  gabapentin (NEURONTIN) 300 MG capsule Take 1 capsule (300 mg total) by mouth 3 (three) times daily. Patient not taking: Reported on 10/12/2018 06/11/18   [name], PA-C  hydrochlorothiazide (MICROZIDE) 12.5 MG capsule Take 12.5 mg by mouth daily.    [provider]  liothyronine (CYTOMEL) 25 MCG tablet Take 25 mcg by mouth daily. 07/26/18   [provider]  losartan (COZAAR) 100 MG tablet Take 100 mg by mouth daily. 07/26/18   [provider]  Multiple Vitamins-Minerals (CENTRUM SILVER 50+MEN) TABS Take 1 tablet by mouth daily.    [provider]  oxyCODONE-acetaminophen (PERCOCET) 10-325 MG tablet Take 1 tablet by mouth every 4 (four) hours as needed for pain.    [provider]  pantoprazole (PROTONIX) 40 MG tablet Take 40 mg by mouth every evening.     [provider]  Probiotic Product (PROBIOTIC DAILY PO) Take 1 capsule by mouth daily.    [provider]  sildenafil (VIAGRA) 100 MG tablet Take 100 mg by mouth daily as needed for erectile dysfunction.    [provider]     No family history on file.  Social History   Socioeconomic History   Marital status: Single    Spouse  name: Not on file   Number of children: Not on file   Years of education: Not on file   Highest education level: Not on file  Occupational History   Not on file  Social Needs   Financial resource strain: Not on file   Food insecurity    Worry: Not on file    Inability: Not on file   Transportation needs    Medical: Not on file    Non-medical: Not on file  Tobacco Use   Smoking status: Current Every Day Smoker    Packs/day: 0.50   Smokeless tobacco: Never Used   Tobacco comment: 3 a day  Substance and Sexual Activity   Alcohol use: Never    Frequency: Never    Comment: BEER ONCE IN A WHILE    Drug use:  Never   Sexual activity: Not Currently  Lifestyle   Physical activity    Days per week: Not on file    Minutes per session: Not on file   Stress: Not on file  Relationships   Social connections    Talks on phone: Not on file    Gets together: Not on file    Attends religious service: Not on file    Active member of club or organization: Not on file    Attends meetings of clubs or organizations: Not on file    Relationship status: Not on file  Other Topics Concern   Not on file  Social History Narrative   10-23-17 Unable to ask abuse questions wife and other family with him today.    ECOG Status: 0 - Asymptomatic  Review of Systems  Constitutional: Negative.   HENT: Negative.   Respiratory: Negative.   Cardiovascular: Negative.   Gastrointestinal: Negative.   Genitourinary: Negative.   Musculoskeletal:       Chronic low back pain.  Neurological: Negative.     Review of Systems: A 12 point ROS discussed and pertinent positives are indicated in the HPI above.  All other systems are negative.  Physical Exam No direct physical exam was performed (except for noted visual exam findings with Video Visits).    Vital Signs: There were no vitals taken for this visit.  Imaging: Dg Chest 1 View  Result Date: 10/26/2018 CLINICAL DATA:  Preoperative for left renal mass ablation. EXAM: CHEST  1 VIEW COMPARISON:  08/10/2017 chest radiograph.  08/16/2018 chest CT. FINDINGS: Stable cardiomediastinal silhouette with normal heart size. No pneumothorax. No pleural effusion. Irregular apical left lung opacity. No pulmonary edema. No acute consolidative airspace disease. IMPRESSION: Irregular apical left lung opacity, correlating with the site of radiation change described on 08/16/2018 chest CT. Continued routine chest CT follow-up advised. Otherwise no active cardiopulmonary disease. Electronically Signed   By: Ilona Sorrel M.D.   On: 10/26/2018 16:57   Ct Guide Tissue  Ablation  Result Date: 10/31/2018 CLINICAL DATA:  2.6 cm left renal mass. The patient presents for biopsy and cryoablation of the mass. EXAM: CT-GUIDED CORE BIOPSY OF LEFT RENAL MASS CT-GUIDED PERCUTANEOUS CRYOABLATION OF LEFT RENAL MASS ANESTHESIA/SEDATION: General MEDICATIONS: 2 g IV Ancef. The antibiotic was administered in an appropriate time interval prior to needle puncture of the skin. CONTRAST:  28mL OMNIPAQUE IOHEXOL 350 MG/ML SOLN PROCEDURE: The procedure, risks, benefits, and alternatives were explained to the patient. Questions regarding the procedure were encouraged and answered. The patient understands and consents to the procedure. The patient was placed under general anesthesia. Initial unenhanced CT was performed in a prone  position to localize the left renal mass. The left flank region was prepped with chlorhexidine in a sterile fashion, and a sterile drape was applied covering the operative field. A sterile gown and sterile gloves were used for the procedure. A time-out was performed prior to initiating the procedure. A 17 gauge trocar needle was advanced into the left renal mass. Core biopsy was performed with an 18 gauge automated core biopsy device. A single core biopsy sample was submitted in formalin for pathologic analysis. Under CT guidance, 2 separate David Stall CX percutaneous cryoablation probes were advanced into the left renal mass. Probe positioning was confirmed by CT prior to cryoablation. Cryoablation was performed through the 2 probes simultaneously. Initial 10 minute cycle of cryoablation was performed. This was followed by a 8 minute thaw cycle. A second 10 minute cycle of cryoablation was then performed. During ablation, periodic CT imaging was performed to monitor ice ball formation and morphology. After active thaw and 30 second cautery cycle, the cryoablation probes were removed. COMPLICATIONS: None FINDINGS: CT demonstrates a partially exophytic posterior interpolar  mass of the left kidney. In order to fully defined borders of the mass and allow accurate probe placement, IV contrast was administered. This demonstrates stable size of the mass which measures approximately 2.6 cm in greatest diameter. Core biopsy yielded fragmented tissue. During cryoablation, CT demonstrates adequate ice ball formation which encompasses the mass. There was no evidence hemorrhage during the procedure. IMPRESSION: CT guided core biopsy and percutaneous cryoablation of a 2.6 cm left renal mass. The patient will be observed overnight. Electronically Signed   By: Aletta Edouard M.D.   On: 10/31/2018 14:08   Ct Biopsy  Result Date: 10/31/2018 CLINICAL DATA:  2.6 cm left renal mass. The patient presents for biopsy and cryoablation of the mass. EXAM: CT-GUIDED CORE BIOPSY OF LEFT RENAL MASS CT-GUIDED PERCUTANEOUS CRYOABLATION OF LEFT RENAL MASS ANESTHESIA/SEDATION: General MEDICATIONS: 2 g IV Ancef. The antibiotic was administered in an appropriate time interval prior to needle puncture of the skin. CONTRAST:  16mL OMNIPAQUE IOHEXOL 350 MG/ML SOLN PROCEDURE: The procedure, risks, benefits, and alternatives were explained to the patient. Questions regarding the procedure were encouraged and answered. The patient understands and consents to the procedure. The patient was placed under general anesthesia. Initial unenhanced CT was performed in a prone position to localize the left renal mass. The left flank region was prepped with chlorhexidine in a sterile fashion, and a sterile drape was applied covering the operative field. A sterile gown and sterile gloves were used for the procedure. A time-out was performed prior to initiating the procedure. A 17 gauge trocar needle was advanced into the left renal mass. Core biopsy was performed with an 18 gauge automated core biopsy device. A single core biopsy sample was submitted in formalin for pathologic analysis. Under CT guidance, 2 separate David Stall CX percutaneous cryoablation probes were advanced into the left renal mass. Probe positioning was confirmed by CT prior to cryoablation. Cryoablation was performed through the 2 probes simultaneously. Initial 10 minute cycle of cryoablation was performed. This was followed by a 8 minute thaw cycle. A second 10 minute cycle of cryoablation was then performed. During ablation, periodic CT imaging was performed to monitor ice ball formation and morphology. After active thaw and 30 second cautery cycle, the cryoablation probes were removed. COMPLICATIONS: None FINDINGS: CT demonstrates a partially exophytic posterior interpolar mass of the left kidney. In order to fully defined borders of the mass and  allow accurate probe placement, IV contrast was administered. This demonstrates stable size of the mass which measures approximately 2.6 cm in greatest diameter. Core biopsy yielded fragmented tissue. During cryoablation, CT demonstrates adequate ice ball formation which encompasses the mass. There was no evidence hemorrhage during the procedure. IMPRESSION: CT guided core biopsy and percutaneous cryoablation of a 2.6 cm left renal mass. The patient will be observed overnight. Electronically Signed   By: Aletta Edouard M.D.   On: 10/31/2018 14:08    Labs:  CBC: Recent Labs    10/26/18 1219 10/31/18 0659 11/01/18 0321  WBC 10.0 7.4 12.3*  HGB 16.2 15.6 14.5  HCT 49.0 47.2 44.8  PLT 266 270 263    COAGS: Recent Labs    10/26/18 1219 10/31/18 0715  INR 0.9 1.0    BMP: Recent Labs    09/12/18 1105 10/26/18 1219 10/31/18 0659 11/01/18 0321  NA  --  135 133* 132*  K  --  4.6 4.1 4.4  CL  --  102 104 103  CO2  --  21* 20* 20*  GLUCOSE  --  106* 103* 127*  BUN 13 13 17 20   CALCIUM  --  9.4 8.7* 8.7*  CREATININE 1.08 0.96 1.10 1.31*  GFRNONAA >60 >60 >60 56*  GFRAA >60 >60 >60 >60    Assessment and Plan:  Mr. Gortney is doing well after cryoablation of a biopsy proven left renal  carcinoma. I recommended we perform a follow up CT in 2-3 months after rechecking renal function.  Electronically Signed: Azzie Roup 11/22/2018, 10:54 AM     I spent a total of 10 Minutes in remote  clinical consultation, greater than 50% of which was counseling/coordinating care post ablation of a left renal carcinoma.    Visit type: Audio only (telephone). Audio (no video) only due to patient's lack of internet/smartphone capability. Alternative for in-person consultation at [facility], [address], [place], [place]. This visit type was conducted due to national recommendations for restrictions regarding the COVID-19 Pandemic (e.g. social distancing).  This format is felt to be most appropriate for this patient at this time.  All issues noted in this document were discussed and addressed.

## 2018-12-11 ENCOUNTER — Telehealth: Payer: Self-pay | Admitting: Radiation Oncology

## 2018-12-11 NOTE — Telephone Encounter (Signed)
I called the patient's sister [name] back after she had left a message for me. I encouraged her to call us back.

## 2019-01-04 DIAGNOSIS — I10 Essential (primary) hypertension: Secondary | ICD-10-CM | POA: Diagnosis not present

## 2019-01-04 DIAGNOSIS — K219 Gastro-esophageal reflux disease without esophagitis: Secondary | ICD-10-CM | POA: Diagnosis not present

## 2019-02-12 DIAGNOSIS — I10 Essential (primary) hypertension: Secondary | ICD-10-CM | POA: Diagnosis not present

## 2019-02-12 DIAGNOSIS — J301 Allergic rhinitis due to pollen: Secondary | ICD-10-CM | POA: Diagnosis not present

## 2019-02-12 DIAGNOSIS — Z79891 Long term (current) use of opiate analgesic: Secondary | ICD-10-CM | POA: Diagnosis not present

## 2019-02-12 DIAGNOSIS — J449 Chronic obstructive pulmonary disease, unspecified: Secondary | ICD-10-CM | POA: Diagnosis not present

## 2019-02-12 DIAGNOSIS — R4582 Worries: Secondary | ICD-10-CM | POA: Diagnosis not present

## 2019-02-14 ENCOUNTER — Telehealth: Payer: Self-pay | Admitting: *Deleted

## 2019-02-14 NOTE — Telephone Encounter (Signed)
Called patient to inform of CT for 02-19-19- arrival time- 1:45 pm for lab and CT @ 2:30 pm @ [facility], pt. to be NPO- 4 hrs. prior to test, patient to receive test results on 02-20-19 @ 8:30 am via phone from Shona Simpson, spoke with patient and he verified understanding all appts.

## 2019-02-19 ENCOUNTER — Ambulatory Visit (HOSPITAL_COMMUNITY)
Admission: RE | Admit: 2019-02-19 | Discharge: 2019-02-19 | Disposition: A | Discharge status: Home / Self Care | Payer: Medicare Other | Source: Ambulatory Visit | Attending: Radiation Oncology | Admitting: Radiation Oncology

## 2019-02-19 ENCOUNTER — Other Ambulatory Visit: Payer: Self-pay

## 2019-02-19 DIAGNOSIS — C3412 Malignant neoplasm of upper lobe, left bronchus or lung: Secondary | ICD-10-CM

## 2019-02-19 LAB — POCT I-STAT CREATININE: Creatinine, Ser: 1 mg/dL (ref 0.61–1.24)

## 2019-02-19 MED ORDER — IOHEXOL 300 MG/ML  SOLN
75.0000 mL | Freq: Once | INTRAMUSCULAR | Status: AC | PRN
  Administered 2019-02-19: 14:00:00 75 mL via INTRAVENOUS

## 2019-02-20 ENCOUNTER — Ambulatory Visit
Admission: RE | Admit: 2019-02-20 | Discharge: 2019-02-20 | Disposition: A | Discharge status: Home / Self Care | Payer: Medicare Other | Source: Ambulatory Visit | Attending: Radiation Oncology | Admitting: Radiation Oncology

## 2019-02-20 ENCOUNTER — Other Ambulatory Visit: Payer: Self-pay

## 2019-02-20 ENCOUNTER — Encounter: Payer: Self-pay | Admitting: Radiation Oncology

## 2019-02-20 DIAGNOSIS — Z85528 Personal history of other malignant neoplasm of kidney: Secondary | ICD-10-CM | POA: Diagnosis not present

## 2019-02-20 DIAGNOSIS — Z85118 Personal history of other malignant neoplasm of bronchus and lung: Secondary | ICD-10-CM | POA: Diagnosis not present

## 2019-02-20 DIAGNOSIS — Z08 Encounter for follow-up examination after completed treatment for malignant neoplasm: Secondary | ICD-10-CM | POA: Diagnosis not present

## 2019-02-20 DIAGNOSIS — C3412 Malignant neoplasm of upper lobe, left bronchus or lung: Secondary | ICD-10-CM

## 2019-02-20 DIAGNOSIS — R0789 Other chest pain: Secondary | ICD-10-CM | POA: Diagnosis not present

## 2019-02-20 NOTE — Progress Notes (Signed)
Radiation Oncology         ([phone] ________________________________  Outpatient Follow Up - Conducted via telephone due to current COVID-19 concerns for limiting patient exposure  I spoke with the patient to conduct this consult visit via telephone to spare the patient unnecessary potential exposure in the healthcare setting during the current COVID-19 pandemic. The patient was notified in advance and was offered a [facility] meeting to allow for face to face communication but unfortunately reported that they did not have the appropriate resources/technology to support such a visit and instead preferred to proceed with a telephone visit.   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 The patient received 60Gy in26factions at 12Gyper fraction.   Narrative:  Reginald Berry a pleasant 68y.o. gentleman with a history of multifocal stage I NSCLC of the left lung. The patient was originally diagnosed with an adenosquamous carcinoma of the LUL in 2019 and received SBRT as he was not a candidate for surgical resection. He has been followed in surveillance in our clinic and was noted to have a new enlarging lesion in the left suprahilar region. He did undergo further work up with PET in March 2020 showed significant hypermetabolism in the suprahilar lesion as well as an exophytic lesion in the left kidney. He proceeded with SBRT to  the suprahilar lesion and post treatment CT chest on 08/16/2018 revealed post radiotherapy change in the LUL and no solid nodule was visible. There was also postradiotherapy change in the LUL closer to the site of the suprahilar treatment. He had an MRI abdomen on 09/12/2018 that revealed a solid 2.7 cm solid mass in the left kidney consistent with renal cell carcinoma and met with Dr. HLouis Meckel On 10/31/2018 he underwent GT guided ablation of the left renal lesion by Dr. YKathlene Coteand he follows along with Dr. HLouis Meckelin urology. He had a repeat CT chest yesterday which confirms post radiotherapy patchy consolidation in the LUL posteriorly and along the suprahilar region. No new findings were noted. He has coronary atherosclerotic changes. No other concerns were noted.    On review of systems, the patient reports that he is doing well overall. He denies any chest pain, shortness of breath, cough, fevers, chills, night sweats, unintended weight changes. He denies any bowel or bladder disturbances, and denies abdominal pain, nausea or vomiting. He denies any new musculoskeletal or joint aches or pains, new skin lesions or concerns. A complete review of systems is obtained and is otherwise negative.  PAST MEDICAL HISTORY:  Past Medical History:  Diagnosis Date  . Arthritis    hands  . Dyspnea    with activity  . GERD (gastroesophageal reflux disease)    at times  . Hypertension   . Left renal mass   . Lung cancer ([place]    Small cell  . Thyroid cyst    "Growth"    PAST SURGICAL HISTORY: Past Surgical History:  Procedure Laterality Date  .  CHOLECYSTECTOMY     2019  . FRACTURE SURGERY     bil legs (logging trees)  . IR RADIOLOGIST EVAL & MGMT  09/27/2018  . IR RADIOLOGIST EVAL & MGMT  11/22/2018  . NECK SURGERY  1994-1995  . RADIOLOGY WITH ANESTHESIA Left 10/31/2018   Procedure: CT WITH ANESTHESIA RENAL CRYO ABLATION;  Surgeon: Aletta Edouard, MD;  Location: [facility];  Service: Radiology;   Laterality: Left;    PAST SOCIAL HISTORY:  Social History   Socioeconomic History  . Marital status: Single    Spouse name: Not on file  . Number of children: Not on file  . Years of education: Not on file  . Highest education level: Not on file  Occupational History  . Not on file  Tobacco Use  . Smoking status: Current Every Day Smoker    Packs/day: 0.50  . Smokeless tobacco: Never Used  . Tobacco comment: 3 a day  Substance and Sexual Activity  . Alcohol use: Never    Comment: BEER ONCE IN A WHILE   . Drug use: Never  . Sexual activity: Not Currently  Other Topics Concern  . Not on file  Social History Narrative   10-23-17 Unable to ask abuse questions wife and other family with him today.   Social Determinants of Health   Financial Resource Strain:   . Difficulty of Paying Living Expenses: Not on file  Food Insecurity:   . Worried About Charity fundraiser in the Last Year: Not on file  . Ran Out of Food in the Last Year: Not on file  Transportation Needs:   . Lack of Transportation (Medical): Not on file  . Lack of Transportation (Non-Medical): Not on file  Physical Activity:   . Days of Exercise per Week: Not on file  . Minutes of Exercise per Session: Not on file  Stress:   . Feeling of Stress : Not on file  Social Connections:   . Frequency of Communication with Friends and Family: Not on file  . Frequency of Social Gatherings with Friends and Family: Not on file  . Attends Religious Services: Not on file  . Active Member of Clubs or Organizations: Not on file  . Attends Archivist Meetings: Not on file  . Marital Status: Not on file  Intimate Partner Violence:   . Fear of Current or Ex-Partner: Not on file  . Emotionally Abused: Not on file  . Physically Abused: Not on file  . Sexually Abused: Not on file    PAST FAMILY HISTORY:No family history on file.  MEDICATIONS  Current Outpatient Medications  Medication Sig Dispense Refill  .  amLODipine (NORVASC) 10 MG tablet Take 10 mg by mouth daily.    Marland Kitchen aspirin 81 MG EC tablet Take 81 mg by mouth daily. Swallow whole.    Marland Kitchen buPROPion (WELLBUTRIN XL) 300 MG 24 hr tablet Take 300 mg by mouth daily.    . busPIRone (BUSPAR) 15 MG tablet Take 15 mg by mouth daily.    . cetirizine (ZYRTEC) 10 MG tablet Take 10 mg by mouth daily as needed for allergies.    . furosemide (LASIX) 40 MG tablet Take 40 mg by mouth daily.     Marland Kitchen gabapentin (NEURONTIN) 300 MG capsule Take 1 capsule (300 mg total) by mouth 3 (three) times daily. 90 capsule 0  . hydrochlorothiazide (MICROZIDE) 12.5 MG capsule Take 12.5 mg by mouth daily.    Marland Kitchen liothyronine (CYTOMEL) 25 MCG tablet Take  25 mcg by mouth daily.    Marland Kitchen losartan (COZAAR) 100 MG tablet Take 100 mg by mouth daily.    . Multiple Vitamins-Minerals (CENTRUM SILVER 50+MEN) TABS Take 1 tablet by mouth daily.    Marland Kitchen oxyCODONE-acetaminophen (PERCOCET) 10-325 MG tablet Take 1 tablet by mouth every 4 (four) hours as needed for pain.    . pantoprazole (PROTONIX) 40 MG tablet Take 40 mg by mouth every evening.     . Probiotic Product (PROBIOTIC DAILY PO) Take 1 capsule by mouth daily.    . sildenafil (VIAGRA) 100 MG tablet Take 100 mg by mouth daily as needed for erectile dysfunction.     No current facility-administered medications for this encounter.    ALLERGIES: No Known Allergies  PHYSICAL EXAM: Unable to assess due to encounter type.   Impression/Plan: 1. 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 The patient is clinically and radiographically without evidence of disease. We reviewed the anticipated post radiotherapy scaring in the lung. He was encouraged to have repeat CT chest in 6 months time and will call sooner if he has questions or concerns prior to that visit. 2. Left Chest wall pain. While he does not have visible rib fracturing, he  has had some chest wall neuralgia that has been treated with Gabapentin in the past which have improved. He also has decided to continue this as it has helped his peripheral neuropathy. He will continue with PCP for refills of this as well.  3. Left renal cell carcinoma. The patient will continue to follow up with Dr. [name] and Dr. Kathlene Cote.    Given current concerns for patient exposure during the COVID-19 pandemic, this encounter was conducted via telephone.  The patient has given verbal consent for this type of encounter. The time spent during this encounter was 15 minutes and 50% of that time was spent in the coordination of his care. The attendants for this meeting include Shona Simpson, [facility] and [name]  During the encounter, Shona Simpson [facility] was located at [facility] Radiation Oncology Department.  Reginald Berry  was located at home.    Carola Rhine, PAC

## 2019-03-20 ENCOUNTER — Other Ambulatory Visit: Payer: Self-pay | Admitting: Interventional Radiology

## 2019-03-20 DIAGNOSIS — N2889 Other specified disorders of kidney and ureter: Secondary | ICD-10-CM

## 2019-04-04 ENCOUNTER — Ambulatory Visit (HOSPITAL_COMMUNITY)
Admission: RE | Admit: 2019-04-04 | Discharge: 2019-04-04 | Disposition: A | Discharge status: Home / Self Care | Payer: Medicare Other | Source: Ambulatory Visit | Attending: Interventional Radiology | Admitting: Interventional Radiology

## 2019-04-04 ENCOUNTER — Other Ambulatory Visit: Payer: Self-pay

## 2019-04-04 DIAGNOSIS — N2889 Other specified disorders of kidney and ureter: Secondary | ICD-10-CM | POA: Insufficient documentation

## 2019-04-04 LAB — POCT I-STAT CREATININE: Creatinine, Ser: 1.3 mg/dL — ABNORMAL HIGH (ref 0.61–1.24)

## 2019-04-04 MED ORDER — IOHEXOL 300 MG/ML  SOLN
100.0000 mL | Freq: Once | INTRAMUSCULAR | Status: AC | PRN
  Administered 2019-04-04: 100 mL via INTRAVENOUS

## 2019-04-10 ENCOUNTER — Encounter: Payer: Self-pay | Admitting: *Deleted

## 2019-04-10 ENCOUNTER — Other Ambulatory Visit: Payer: Self-pay

## 2019-04-10 ENCOUNTER — Ambulatory Visit
Admission: RE | Admit: 2019-04-10 | Discharge: 2019-04-10 | Disposition: A | Discharge status: Home / Self Care | Payer: Medicare Other | Source: Ambulatory Visit | Attending: Interventional Radiology | Admitting: Interventional Radiology

## 2019-04-10 DIAGNOSIS — C642 Malignant neoplasm of left kidney, except renal pelvis: Secondary | ICD-10-CM | POA: Diagnosis not present

## 2019-04-10 DIAGNOSIS — N2889 Other specified disorders of kidney and ureter: Secondary | ICD-10-CM

## 2019-04-10 HISTORY — PX: IR RADIOLOGIST EVAL & MGMT: IMG5224

## 2019-04-10 NOTE — Progress Notes (Signed)
Chief Complaint: Patient was consulted remotely today (TeleHealth) for follow up after cryoablation of a left renal carcinoma.  History of Present Illness: Reginald Berry is a 69 y.o. male that is post cryoablation of a 2.6 cm posterior interpolar biopsy-proven left renal clear cell carcinoma on 10/31/2018.  He did well after the procedure with slight increase in serum creatinine.  He has no complaints today.  He followed up with Shona Simpson in December and currently has no evidence of recurrent left upper lobe lung carcinoma after SBRT.  Past Medical History:  Diagnosis Date  . Arthritis    hands  . Dyspnea    with activity  . GERD (gastroesophageal reflux disease)    at times  . Hypertension   . Left renal mass   . Lung cancer ([place])    Small cell  . Thyroid cyst    "Growth"    Past Surgical History:  Procedure Laterality Date  . CHOLECYSTECTOMY     2019  . FRACTURE SURGERY     bil legs (logging trees)  . IR RADIOLOGIST EVAL & MGMT  09/27/2018  . IR RADIOLOGIST EVAL & MGMT  11/22/2018  . NECK SURGERY  1994-1995  . RADIOLOGY WITH ANESTHESIA Left 10/31/2018   Procedure: CT WITH ANESTHESIA RENAL CRYO ABLATION;  Surgeon: Aletta Edouard, MD;  Location: [facility];  Service: Radiology;  Laterality: Left;    Allergies: Patient has no known allergies.  Medications: Prior to Admission medications   Medication Sig Start Date End Date Taking? Authorizing Provider  amLODipine (NORVASC) 10 MG tablet Take 10 mg by mouth daily.    [provider]  aspirin 81 MG EC tablet Take 81 mg by mouth daily. Swallow whole.    [provider]  buPROPion (WELLBUTRIN XL) 300 MG 24 hr tablet Take 300 mg by mouth daily. 07/26/18   [provider]  busPIRone (BUSPAR) 15 MG tablet Take 15 mg by mouth daily. 07/26/18   [provider]  cetirizine (ZYRTEC) 10 MG tablet Take 10 mg by mouth daily as needed for allergies.    [provider]  furosemide  (LASIX) 40 MG tablet Take 40 mg by mouth daily.     [provider]  gabapentin (NEURONTIN) 300 MG capsule Take 1 capsule (300 mg total) by mouth 3 (three) times daily. 06/11/18   [name], PA-C  hydrochlorothiazide (MICROZIDE) 12.5 MG capsule Take 12.5 mg by mouth daily.    [provider]  liothyronine (CYTOMEL) 25 MCG tablet Take 25 mcg by mouth daily. 07/26/18   [provider]  losartan (COZAAR) 100 MG tablet Take 100 mg by mouth daily. 07/26/18   [provider]  Multiple Vitamins-Minerals (CENTRUM SILVER 50+MEN) TABS Take 1 tablet by mouth daily.    [provider]  oxyCODONE-acetaminophen (PERCOCET) 10-325 MG tablet Take 1 tablet by mouth every 4 (four) hours as needed for pain.    [provider]  pantoprazole (PROTONIX) 40 MG tablet Take 40 mg by mouth every evening.     [provider]  Probiotic Product (PROBIOTIC DAILY PO) Take 1 capsule by mouth daily.    [provider]  sildenafil (VIAGRA) 100 MG tablet Take 100 mg by mouth daily as needed for erectile dysfunction.    [provider]     No family history on file.  Social History   Socioeconomic History  . Marital status: Single    Spouse name: Not on file  . Number  of children: Not on file  . Years of education: Not on file  . Highest education level: Not on file  Occupational History  . Not on file  Tobacco Use  . Smoking status: Current Every Day Smoker    Packs/day: 0.50  . Smokeless tobacco: Never Used  . Tobacco comment: 3 a day  Substance and Sexual Activity  . Alcohol use: Never    Comment: BEER ONCE IN A WHILE   . Drug use: Never  . Sexual activity: Not Currently  Other Topics Concern  . Not on file  Social History Narrative   10-23-17 Unable to ask abuse questions wife and other family with him today.   Social Determinants of Health   Financial Resource Strain:   . Difficulty of Paying Living Expenses: Not on  file  Food Insecurity:   . Worried About Charity fundraiser in the Last Year: Not on file  . Ran Out of Food in the Last Year: Not on file  Transportation Needs:   . Lack of Transportation (Medical): Not on file  . Lack of Transportation (Non-Medical): Not on file  Physical Activity:   . Days of Exercise per Week: Not on file  . Minutes of Exercise per Session: Not on file  Stress:   . Feeling of Stress : Not on file  Social Connections:   . Frequency of Communication with Friends and Family: Not on file  . Frequency of Social Gatherings with Friends and Family: Not on file  . Attends Religious Services: Not on file  . Active Member of Clubs or Organizations: Not on file  . Attends Archivist Meetings: Not on file  . Marital Status: Not on file    ECOG Status: 0 - Asymptomatic  Review of Systems  Constitutional: Negative.   Respiratory: Negative.   Cardiovascular: Negative.   Gastrointestinal: Negative.   Genitourinary: Negative.   Musculoskeletal: Negative.   Neurological: Negative.     Review of Systems: A 12 point ROS discussed and pertinent positives are indicated in the HPI above.  All other systems are negative.  Physical Exam No direct physical exam was performed (except for noted visual exam findings with Video Visits).   Vital Signs: There were no vitals taken for this visit.  Imaging: CT ABDOMEN W WO CONTRAST  Result Date: 04/04/2019 CLINICAL DATA:  Left renal mass, status post cryoablation EXAM: CT ABDOMEN WITHOUT AND WITH CONTRAST TECHNIQUE: Multidetector CT imaging of the abdomen was performed following the standard protocol before and following the bolus administration of intravenous contrast. CONTRAST:  158mL OMNIPAQUE IOHEXOL 300 MG/ML  SOLN COMPARISON:  MR abdomen, 09/12/2018, CT-guided cryoablation, 10/31/2018, CT chest, 12/20/2018 FINDINGS: Lower chest: No acute abnormality. Hepatobiliary: No focal liver abnormality is seen. Status post  cholecystectomy. No biliary dilatation. Pancreas: Unremarkable. No pancreatic ductal dilatation or surrounding inflammatory changes. Spleen: Normal in size without significant abnormality. Adrenals/Urinary Tract: Small bilateral benign fat containing adrenal nodules. There is expected post ablation appearance of a mass of the posterior midportion of the left kidney, with adjacent perinephric fat stranding and no residual contrast enhancement of the lesion, precontrast HU = 7, postcontrast HU = 12. The mass is essentially unchanged in size measuring 2.4 x 2.1 cm (series 3, image 55). Additional nonenhancing simple cysts of the right kidney. Bladder is unremarkable. Stomach/Bowel: Stomach is within normal limits. Appendix appears normal. No evidence of bowel wall thickening, distention, or inflammatory changes. Vascular/Lymphatic: Aortic atherosclerosis. Unchanged enlargement of the infrarenal abdominal aorta  measuring up to 3.1 x 3.0 cm. No enlarged abdominal or pelvic lymph nodes. Reproductive: No mass or other significant abnormality. Other: No abdominal wall hernia or abnormality. No abdominopelvic ascites. Musculoskeletal: No acute or significant osseous findings. IMPRESSION: 1. Expected post ablation changes of a mass of the posterior midportion of the left kidney without evidence of residual contrast enhancement. Attention on follow-up from this post treatment baseline. 2. Aortic Atherosclerosis (ICD10-I70.0). Unchanged enlargement of the infrarenal abdominal aorta measuring up to 3.1 x 3.0 cm. Electronically Signed   By: Eddie Candle M.D.   On: 04/04/2019 14:30    Labs:  CBC: Recent Labs    10/26/18 1219 10/31/18 0659 11/01/18 0321  WBC 10.0 7.4 12.3*  HGB 16.2 15.6 14.5  HCT 49.0 47.2 44.8  PLT 266 270 263    COAGS: Recent Labs    10/26/18 1219 10/31/18 0715  INR 0.9 1.0    BMP: Recent Labs    09/12/18 1105 09/12/18 1105 10/26/18 1219 10/26/18 1219 10/31/18 0659 11/01/18 0321  02/19/19 1430 04/04/19 1303  NA  --   --  135  --  133* 132*  --   --   K  --   --  4.6  --  4.1 4.4  --   --   CL  --   --  102  --  104 103  --   --   CO2  --   --  21*  --  20* 20*  --   --   GLUCOSE  --   --  106*  --  103* 127*  --   --   BUN 13  --  13  --  17 20  --   --   CALCIUM  --   --  9.4  --  8.7* 8.7*  --   --   CREATININE 1.08   < > 0.96   < > 1.10 1.31* 1.00 1.30*  GFRNONAA >60  --  >60  --  >60 56*  --   --   GFRAA >60  --  >60  --  >60 >60  --   --    < > = values in this interval not displayed.     Assessment and Plan:  I spoke with Reginald Berry by phone.  I reviewed imaging results from the follow-up CT of the abdomen performed on 04/04/2019 which demonstrates a well circumscribed ablation defect along the posterior aspect of the mid left kidney without evidence of contrast enhancement.  There is no evidence of complication following the procedure.  Given that the scan represents a 10-month post treatment scan rather than the more typical 2 to 3 months initially after treatment, I have recommended that we could likely wait a whole year before repeating a CT.  We will schedule another scan in January of next year.  Electronically Signed: Azzie Roup 04/10/2019, 10:41 AM     I spent a total of 10 Minutes in remote  clinical consultation, greater than 50% of which was counseling/coordinating care post ablation of a left renal carcinoma.    Visit type: Audio only (telephone). Audio (no video) only due to patient's lack of internet/smartphone capability. Alternative for in-person consultation at [facility], [address], [place], [place]. This visit type was conducted due to national recommendations for restrictions regarding the COVID-19 Pandemic (e.g. social distancing).  This format is felt to be most appropriate for this patient at this time.  All issues  noted in this document were discussed and addressed.

## 2019-05-03 DIAGNOSIS — E7849 Other hyperlipidemia: Secondary | ICD-10-CM | POA: Diagnosis not present

## 2019-05-03 DIAGNOSIS — J449 Chronic obstructive pulmonary disease, unspecified: Secondary | ICD-10-CM | POA: Diagnosis not present

## 2019-05-03 DIAGNOSIS — I10 Essential (primary) hypertension: Secondary | ICD-10-CM | POA: Diagnosis not present

## 2019-05-07 DIAGNOSIS — I10 Essential (primary) hypertension: Secondary | ICD-10-CM | POA: Diagnosis not present

## 2019-05-07 DIAGNOSIS — Z79891 Long term (current) use of opiate analgesic: Secondary | ICD-10-CM | POA: Diagnosis not present

## 2019-05-07 DIAGNOSIS — E041 Nontoxic single thyroid nodule: Secondary | ICD-10-CM | POA: Diagnosis not present

## 2019-05-07 DIAGNOSIS — K219 Gastro-esophageal reflux disease without esophagitis: Secondary | ICD-10-CM | POA: Diagnosis not present

## 2019-05-10 DIAGNOSIS — Z0001 Encounter for general adult medical examination with abnormal findings: Secondary | ICD-10-CM | POA: Diagnosis not present

## 2019-05-10 DIAGNOSIS — M5000 Cervical disc disorder with myelopathy, unspecified cervical region: Secondary | ICD-10-CM | POA: Diagnosis not present

## 2019-05-10 DIAGNOSIS — J301 Allergic rhinitis due to pollen: Secondary | ICD-10-CM | POA: Diagnosis not present

## 2019-05-10 DIAGNOSIS — R4582 Worries: Secondary | ICD-10-CM | POA: Diagnosis not present

## 2019-05-10 DIAGNOSIS — Z79891 Long term (current) use of opiate analgesic: Secondary | ICD-10-CM | POA: Diagnosis not present

## 2019-05-10 DIAGNOSIS — E782 Mixed hyperlipidemia: Secondary | ICD-10-CM | POA: Diagnosis not present

## 2019-05-28 ENCOUNTER — Encounter: Payer: Self-pay | Admitting: *Deleted

## 2019-05-29 ENCOUNTER — Ambulatory Visit (INDEPENDENT_AMBULATORY_CARE_PROVIDER_SITE_OTHER): Payer: Medicare Other | Admitting: Cardiology

## 2019-05-29 ENCOUNTER — Other Ambulatory Visit: Payer: Self-pay

## 2019-05-29 ENCOUNTER — Encounter: Payer: Self-pay | Admitting: Cardiology

## 2019-05-29 VITALS — BP 152/72 | HR 86 | Ht 74.0 in | Wt 315.0 lb

## 2019-05-29 DIAGNOSIS — I2510 Atherosclerotic heart disease of native coronary artery without angina pectoris: Secondary | ICD-10-CM | POA: Diagnosis not present

## 2019-05-29 DIAGNOSIS — I10 Essential (primary) hypertension: Secondary | ICD-10-CM

## 2019-05-29 DIAGNOSIS — N1832 Chronic kidney disease, stage 3b: Secondary | ICD-10-CM

## 2019-05-29 DIAGNOSIS — E782 Mixed hyperlipidemia: Secondary | ICD-10-CM

## 2019-05-29 NOTE — Patient Instructions (Addendum)
Medication Instructions:    Your physician recommends that you continue on your current medications as directed. Please refer to the Current Medication list given to you today.  Labwork:  NONE  Testing/Procedures: Your physician has requested that you have an echocardiogram. Echocardiography is a painless test that uses sound waves to create images of your heart. It provides your doctor with information about the size and shape of your heart and how well your heart's chambers and valves are working. This procedure takes approximately one hour. There are no restrictions for this procedure.  Follow-Up:  Your physician recommends that you schedule a follow-up appointment in: 6 months (office). You will receive a reminder letter in the mail in about 4 months reminding you to call and schedule your appointment. If you don't receive this letter, please contact our office.  Any Other Special Instructions Will Be Listed Below (If Applicable).  If you need a refill on your cardiac medications before your next appointment, please call your pharmacy.

## 2019-05-29 NOTE — Progress Notes (Signed)
Cardiology Office Note  Date: 05/29/2019   ID: Zaniel, [place] 13-Jun-1950, MRN [id number]  PCP:  [name], MD  Consulting Cardiologist:  Rozann Lesches, MD Electrophysiologist:  None   Chief Complaint  Patient presents with  . Cardiac evaluation    History of Present Illness: Reginald Berry is a 69 y.o. male referred for cardiology consultation by Dr. Quillian Quince for cardiac evaluation.  I reviewed his records and updated the chart.  Mr. Kuennen tells me that he was encouraged to be evaluated after having a CT scan of his chest done in follow-up of lung cancer demonstrating evidence of coronary atherosclerosis.  He states that he discussed this with Dr. Quillian Quince.  He specifically denies any chest discomfort.  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  He uses a wheelchair and is able to stand a limited degree but remains functional with basic ADLs and lives in his own home.  He does have multivessel distribution coronary artery calcification based on chest CT in December 2020.  He also has aortic atherosclerosis by abdominal imaging.  He is on good medical therapy by Dr. Quillian Quince including aspirin, Norvasc, HCTZ, Cozaar, and most recently initiation of Crestor.  His most recent LDL was 66.  I personally reviewed his ECG today which shows normal sinus rhythm.  Past Medical History:  Diagnosis Date  . Aortic atherosclerosis ([place])   . Arthritis   . Chronic low back pain   . COPD (chronic obstructive pulmonary disease) ([place])   . Coronary artery calcification seen on CT scan    Multivessel  . Essential hypertension   . Gait instability    Previous logging accident  . GERD (gastroesophageal reflux disease)   . Lung cancer ([place])    Poorly differentiated, XRT  . Renal cell carcinoma ([place])    Left, status post cryoablation August 2020    Past Surgical History:  Procedure Laterality Date  . CHOLECYSTECTOMY      2019  . FRACTURE SURGERY     bil legs (logging trees)  . IR RADIOLOGIST EVAL & MGMT  09/27/2018  . IR RADIOLOGIST EVAL & MGMT  11/22/2018  . IR RADIOLOGIST EVAL & MGMT  04/10/2019  . NECK SURGERY  1994-1995  . RADIOLOGY WITH ANESTHESIA Left 10/31/2018   Procedure: CT WITH ANESTHESIA RENAL CRYO ABLATION;  Surgeon: Aletta Edouard, MD;  Location: [facility];  Service: Radiology;  Laterality: Left;    Current Outpatient Medications  Medication Sig Dispense Refill  . amLODipine (NORVASC) 10 MG tablet Take 10 mg by mouth daily.    Marland Kitchen aspirin 81 MG EC tablet Take 81 mg by mouth daily. Swallow whole.    . busPIRone (BUSPAR) 15 MG tablet Take 15 mg by mouth daily.    . cetirizine (ZYRTEC) 10 MG tablet Take 10 mg by mouth daily as needed for allergies.    . furosemide (LASIX) 40 MG tablet Take 40 mg by mouth daily.     . hydrochlorothiazide (MICROZIDE) 12.5 MG capsule Take 12.5 mg by mouth daily.    Marland Kitchen liothyronine (CYTOMEL) 25 MCG tablet Take 25 mcg by mouth daily.    Marland Kitchen losartan (COZAAR) 100 MG tablet Take 100 mg by mouth daily.    . Multiple Vitamins-Minerals (CENTRUM SILVER 50+MEN) TABS Take 1 tablet by mouth daily.    Marland Kitchen oxyCODONE-acetaminophen (PERCOCET) 10-325 MG tablet Take 1 tablet by mouth every 4 (four) hours as needed for pain.    Marland Kitchen  pantoprazole (PROTONIX) 40 MG tablet Take 40 mg by mouth every evening.     . Probiotic Product (PROBIOTIC DAILY PO) Take 1 capsule by mouth daily.    . rosuvastatin (CRESTOR) 5 MG tablet Take 5 mg by mouth at bedtime.    . sildenafil (VIAGRA) 100 MG tablet Take 100 mg by mouth daily as needed for erectile dysfunction.    Marland Kitchen buPROPion (WELLBUTRIN XL) 300 MG 24 hr tablet Take 300 mg by mouth daily.     No current facility-administered medications for this visit.   Allergies:  Patient has no known allergies.   Social History: The patient  reports that he has been smoking. He has been smoking about 0.50 packs per day. He has never used smokeless tobacco. He reports  that he does not drink alcohol or use drugs.   Family History: The patient's family history includes Diabetes in his sister; Heart attack in his father and mother; Heart disease in his brother; Heart failure in his father; Renal Disease in his mother.   ROS:  Chronic back pain.  Physical Exam: VS:  BP (!) 152/72   Pulse 86   Ht 6\' 2"  (1.88 m)   Wt (!) 315 lb (142.9 kg)   SpO2 98%   BMI 40.44 kg/m , BMI Body mass index is 40.44 kg/m.  Wt Readings from Last 3 Encounters:  05/29/19 (!) 315 lb (142.9 kg)  10/31/18 (!) 339 lb (153.8 kg)  10/12/18 (!) 317 lb (143.8 kg)    General: Morbidly obese male, seated in wheelchair. HEENT: Conjunctiva and lids normal, wearing a mask. Neck: Supple, no elevated JVP or carotid bruits, no thyromegaly. Lungs: Clear to auscultation, nonlabored breathing at rest. Cardiac: Regular rate and rhythm, no S3, soft systolic murmur, no pericardial rub. Abdomen: Protuberant, nontender, bowel sounds present, no guarding or rebound. Extremities: Trace ankle edema, distal pulses 2+. Skin: Warm and dry. Musculoskeletal: No kyphosis. Neuropsychiatric: Alert and oriented x3, affect grossly appropriate.  ECG:  An ECG dated 10/26/2018 was personally reviewed today and demonstrated:  Normal sinus rhythm.  Recent Labwork: 11/01/2018: BUN 20; Hemoglobin 14.5; Platelets 263; Potassium 4.4; Sodium 132 04/04/2019: Creatinine, Ser 1.04 June 2019: BUN 20, creatinine 1.43, potassium 4.6, AST 20, ALT 15, cholesterol 131, triglycerides 144, HDL 40, LDL 66  Other Studies Reviewed Today:  Chest CT 02/19/2019: FINDINGS: Cardiovascular: Normal heart size. No significant pericardial effusion/thickening. Three-vessel coronary atherosclerosis. Atherosclerotic nonaneurysmal thoracic aorta. Top-normal caliber main pulmonary artery (3.3 cm diameter), stable. No central pulmonary emboli.  Mediastinum/Nodes: Stable goiter asymmetrically involving the heterogeneous right thyroid  lobe. Unremarkable esophagus. No pathologically enlarged axillary, mediastinal or hilar lymph nodes.  Lungs/Pleura: No pneumothorax. No pleural effusion. Mild-to-moderate centrilobular and paraseptal emphysema with diffuse bronchial wall thickening. There is increased sharply marginated somewhat bandlike patchy consolidation and ground-glass opacity in the perihilar and posterior left upper lobe extending into the superior segment left lower lobe, with associated mild volume loss and distortion. No discrete lung masses or significant pulmonary nodules. The previously treated posterior left upper lobe and central left upper lobe pulmonary nodules are not discretely visualized on this scan.  Upper abdomen: No acute abnormality.  Musculoskeletal: No aggressive appearing focal osseous lesions. Moderate thoracic spondylosis.  IMPRESSION: 1. Increased sharply marginated bandlike patchy consolidation and ground-glass opacity in the perihilar and posterior left upper lobe, favor evolving postradiation change. Previously treated posterior left upper lobe and central left upper lobe pulmonary nodules not discretely visualized on this scan. Continued chest CT surveillance warranted.  2. No findings of metastatic disease in the chest. 3. Three-vessel coronary atherosclerosis.  CT abdomen 04/04/2019: FINDINGS: Lower chest: No acute abnormality.  Hepatobiliary: No focal liver abnormality is seen. Status post cholecystectomy. No biliary dilatation.  Pancreas: Unremarkable. No pancreatic ductal dilatation or surrounding inflammatory changes.  Spleen: Normal in size without significant abnormality.  Adrenals/Urinary Tract: Small bilateral benign fat containing adrenal nodules. There is expected post ablation appearance of a mass of the posterior midportion of the left kidney, with adjacent perinephric fat stranding and no residual contrast enhancement of the lesion, precontrast HU =  7, postcontrast HU = 12. The mass is essentially unchanged in size measuring 2.4 x 2.1 cm (series 3, image 55). Additional nonenhancing simple cysts of the right kidney. Bladder is unremarkable.  Stomach/Bowel: Stomach is within normal limits. Appendix appears normal. No evidence of bowel wall thickening, distention, or inflammatory changes.  Vascular/Lymphatic: Aortic atherosclerosis. Unchanged enlargement of the infrarenal abdominal aorta measuring up to 3.1 x 3.0 cm. No enlarged abdominal or pelvic lymph nodes.  Reproductive: No mass or other significant abnormality.  Other: No abdominal wall hernia or abnormality. No abdominopelvic ascites.  Musculoskeletal: No acute or significant osseous findings.  IMPRESSION: 1. Expected post ablation changes of a mass of the posterior midportion of the left kidney without evidence of residual contrast enhancement. Attention on follow-up from this post treatment baseline. 2. Aortic Atherosclerosis (ICD10-I70.0). Unchanged enlargement of the infrarenal abdominal aorta measuring up to 3.1 x 3.0 cm.  Assessment and Plan:  1.  Multivessel distribution coronary artery calcification by chest CTA in December 2020.  Patient does not describe any anginal symptoms at this time with current level of activity.  We discussed the implications of these findings including need for medical therapy to reduce risk and also follow-up for review of symptoms.  He is already on aspirin, Norvasc, losartan, and Crestor.  Beta-blocker could be added for additional antianginal control if needed, but at this point he is asymptomatic.  Imdur would be contraindicated with use of Viagra.  Recent LDL was 66.  We will schedule echocardiogram to assess LVEF and schedule follow-up over the next 6 months.  2.  Aortic atherosclerosis by abdominal CT imaging, infrarenal aorta mildly enlarged at 3.1 cm.  He is asymptomatic.  3.  CKD stage IIIb, creatinine 1.43.  Medication  Adjustments/Labs and Tests Ordered: Current medicines are reviewed at length with the patient today.  Concerns regarding medicines are outlined above.   Tests Ordered: Orders Placed This Encounter  Procedures  . EKG 12-Lead  . ECHOCARDIOGRAM COMPLETE    Medication Changes: No orders of the defined types were placed in this encounter.   Disposition:  Follow up 6 months in the [place] office.  Signed, Satira Sark, MD, [facility] 05/29/2019 3:16 PM    [facility], [place], [place] [address] Phone: [phone]; Fax: [phone]

## 2019-06-05 DIAGNOSIS — E7849 Other hyperlipidemia: Secondary | ICD-10-CM | POA: Diagnosis not present

## 2019-06-05 DIAGNOSIS — I10 Essential (primary) hypertension: Secondary | ICD-10-CM | POA: Diagnosis not present

## 2019-06-20 ENCOUNTER — Ambulatory Visit (INDEPENDENT_AMBULATORY_CARE_PROVIDER_SITE_OTHER): Payer: Medicare Other

## 2019-06-20 ENCOUNTER — Other Ambulatory Visit: Payer: Self-pay

## 2019-06-20 DIAGNOSIS — I2510 Atherosclerotic heart disease of native coronary artery without angina pectoris: Secondary | ICD-10-CM

## 2019-06-21 ENCOUNTER — Telehealth: Payer: Self-pay | Admitting: *Deleted

## 2019-06-21 NOTE — Telephone Encounter (Signed)
Patient informed. Copy sent to PCP °

## 2019-06-21 NOTE — Telephone Encounter (Signed)
-----   Message from Satira Sark, MD sent at 06/21/2019  8:08 AM EDT ----- Results reviewed.  Cardiac function is normal with LVEF 65 to 70% and normal diastolic function.  Continue with current follow-up plan.

## 2019-07-05 DIAGNOSIS — Z79891 Long term (current) use of opiate analgesic: Secondary | ICD-10-CM | POA: Diagnosis not present

## 2019-07-05 DIAGNOSIS — I10 Essential (primary) hypertension: Secondary | ICD-10-CM | POA: Diagnosis not present

## 2019-08-05 DIAGNOSIS — Z720 Tobacco use: Secondary | ICD-10-CM | POA: Diagnosis not present

## 2019-08-05 DIAGNOSIS — M5137 Other intervertebral disc degeneration, lumbosacral region: Secondary | ICD-10-CM | POA: Diagnosis not present

## 2019-08-05 DIAGNOSIS — E7849 Other hyperlipidemia: Secondary | ICD-10-CM | POA: Diagnosis not present

## 2019-08-05 DIAGNOSIS — I10 Essential (primary) hypertension: Secondary | ICD-10-CM | POA: Diagnosis not present

## 2019-08-05 DIAGNOSIS — J449 Chronic obstructive pulmonary disease, unspecified: Secondary | ICD-10-CM | POA: Diagnosis not present

## 2019-08-20 ENCOUNTER — Telehealth: Payer: Self-pay | Admitting: *Deleted

## 2019-08-20 NOTE — Telephone Encounter (Signed)
CALLED PATIENT TO INFORM OF CT ON 09-10-19 - ARRIVAL TIME- 3:45 PM @ [facility], PATIENT TO HAVE WATER ONLY - 4 HRS. PRIOR TO TEST, PATIENT TO RECEIVE RESULTS ON 09-23-19 @ 2 PM, VIA TELEPHONE BY PA ALISON PERKINS, LVM FOR A RETURN CALL

## 2019-08-21 DIAGNOSIS — Z79891 Long term (current) use of opiate analgesic: Secondary | ICD-10-CM | POA: Diagnosis not present

## 2019-08-21 DIAGNOSIS — E782 Mixed hyperlipidemia: Secondary | ICD-10-CM | POA: Diagnosis not present

## 2019-08-21 DIAGNOSIS — K219 Gastro-esophageal reflux disease without esophagitis: Secondary | ICD-10-CM | POA: Diagnosis not present

## 2019-08-21 DIAGNOSIS — I10 Essential (primary) hypertension: Secondary | ICD-10-CM | POA: Diagnosis not present

## 2019-08-26 ENCOUNTER — Ambulatory Visit: Payer: Self-pay | Admitting: Radiation Oncology

## 2019-08-26 DIAGNOSIS — R4582 Worries: Secondary | ICD-10-CM | POA: Diagnosis not present

## 2019-08-26 DIAGNOSIS — J301 Allergic rhinitis due to pollen: Secondary | ICD-10-CM | POA: Diagnosis not present

## 2019-08-26 DIAGNOSIS — C649 Malignant neoplasm of unspecified kidney, except renal pelvis: Secondary | ICD-10-CM | POA: Diagnosis not present

## 2019-08-26 DIAGNOSIS — Z79891 Long term (current) use of opiate analgesic: Secondary | ICD-10-CM | POA: Diagnosis not present

## 2019-08-26 DIAGNOSIS — I10 Essential (primary) hypertension: Secondary | ICD-10-CM | POA: Diagnosis not present

## 2019-08-26 DIAGNOSIS — C3490 Malignant neoplasm of unspecified part of unspecified bronchus or lung: Secondary | ICD-10-CM | POA: Diagnosis not present

## 2019-09-04 DIAGNOSIS — M5137 Other intervertebral disc degeneration, lumbosacral region: Secondary | ICD-10-CM | POA: Diagnosis not present

## 2019-09-04 DIAGNOSIS — E7849 Other hyperlipidemia: Secondary | ICD-10-CM | POA: Diagnosis not present

## 2019-09-04 DIAGNOSIS — Z720 Tobacco use: Secondary | ICD-10-CM | POA: Diagnosis not present

## 2019-09-04 DIAGNOSIS — J449 Chronic obstructive pulmonary disease, unspecified: Secondary | ICD-10-CM | POA: Diagnosis not present

## 2019-09-04 DIAGNOSIS — I10 Essential (primary) hypertension: Secondary | ICD-10-CM | POA: Diagnosis not present

## 2019-09-10 ENCOUNTER — Other Ambulatory Visit: Payer: Self-pay

## 2019-09-10 ENCOUNTER — Ambulatory Visit (HOSPITAL_COMMUNITY)
Admission: RE | Admit: 2019-09-10 | Discharge: 2019-09-10 | Disposition: A | Discharge status: Home / Self Care | Payer: Medicare Other | Source: Ambulatory Visit | Attending: Radiation Oncology | Admitting: Radiation Oncology

## 2019-09-10 DIAGNOSIS — I2510 Atherosclerotic heart disease of native coronary artery without angina pectoris: Secondary | ICD-10-CM | POA: Diagnosis not present

## 2019-09-10 DIAGNOSIS — C3412 Malignant neoplasm of upper lobe, left bronchus or lung: Secondary | ICD-10-CM | POA: Diagnosis not present

## 2019-09-10 DIAGNOSIS — I700 Atherosclerosis of aorta: Secondary | ICD-10-CM | POA: Diagnosis not present

## 2019-09-10 DIAGNOSIS — R590 Localized enlarged lymph nodes: Secondary | ICD-10-CM | POA: Diagnosis not present

## 2019-09-10 LAB — POCT I-STAT CREATININE: Creatinine, Ser: 1.2 mg/dL (ref 0.61–1.24)

## 2019-09-10 MED ORDER — IOHEXOL 300 MG/ML  SOLN
75.0000 mL | Freq: Once | INTRAMUSCULAR | Status: AC | PRN
  Administered 2019-09-10: 75 mL via INTRAVENOUS

## 2019-09-16 ENCOUNTER — Telehealth: Payer: Self-pay | Admitting: *Deleted

## 2019-09-16 ENCOUNTER — Telehealth: Payer: Self-pay | Admitting: Radiation Oncology

## 2019-09-16 DIAGNOSIS — C3412 Malignant neoplasm of upper lobe, left bronchus or lung: Secondary | ICD-10-CM

## 2019-09-16 NOTE — Telephone Encounter (Signed)
I called and spoke with the patient regarding his recent CT scan. He has a newly enlarged prevascular node in the left chest that is 14 mm, previously 4 mm on his last scan in December 2020. Dr. Lisbeth Renshaw has recommended a PET scan and this has been ordered. I will follow up with him with these results. He is aware of the possible need for biopsy, and if this is recurrent cancer, then need for evaluation in medical oncology.     Carola Rhine, PAC

## 2019-09-16 NOTE — Telephone Encounter (Signed)
CALLED PATIENT TO INFORM OF PET SCAN FOR 09-17-19- ARRIVAL TIME- 11:30 AM - PATIENT TO HAVE WATER ONLY - 6 HRS. PRIOR TO TEST, PATIENT TO AVOID CARBS MEAL PRIOR TO TEST, SPOKE WITH PATIENT'S SISTER- PAULA AND SHE IS AWARE OF THIS TEST

## 2019-09-16 NOTE — Telephone Encounter (Signed)
Called patient to inform of Pet for 09-17-19 - arrival time- 11:30 am, patient to have water only - 6 hrs. prior to test, patient to avoid carbs last meal before test, test to be @ [facility], lvm for a return call

## 2019-09-17 ENCOUNTER — Other Ambulatory Visit: Payer: Self-pay

## 2019-09-17 ENCOUNTER — Ambulatory Visit (HOSPITAL_COMMUNITY)
Admission: RE | Admit: 2019-09-17 | Discharge: 2019-09-17 | Disposition: A | Discharge status: Home / Self Care | Payer: Medicare Other | Source: Ambulatory Visit | Attending: Radiation Oncology | Admitting: Radiation Oncology

## 2019-09-17 DIAGNOSIS — I700 Atherosclerosis of aorta: Secondary | ICD-10-CM | POA: Diagnosis not present

## 2019-09-17 DIAGNOSIS — C3412 Malignant neoplasm of upper lobe, left bronchus or lung: Secondary | ICD-10-CM | POA: Diagnosis not present

## 2019-09-17 DIAGNOSIS — R234 Changes in skin texture: Secondary | ICD-10-CM | POA: Diagnosis not present

## 2019-09-17 DIAGNOSIS — E278 Other specified disorders of adrenal gland: Secondary | ICD-10-CM | POA: Diagnosis not present

## 2019-09-17 DIAGNOSIS — I2510 Atherosclerotic heart disease of native coronary artery without angina pectoris: Secondary | ICD-10-CM | POA: Diagnosis not present

## 2019-09-17 DIAGNOSIS — C3490 Malignant neoplasm of unspecified part of unspecified bronchus or lung: Secondary | ICD-10-CM | POA: Diagnosis not present

## 2019-09-17 LAB — GLUCOSE, CAPILLARY: Glucose-Capillary: 111 mg/dL — ABNORMAL HIGH (ref 70–99)

## 2019-09-17 MED ORDER — FLUDEOXYGLUCOSE F - 18 (FDG) INJECTION
17.0000 | Freq: Once | INTRAVENOUS | Status: AC
  Administered 2019-09-17: 17 via INTRAVENOUS

## 2019-09-19 ENCOUNTER — Telehealth: Payer: Self-pay | Admitting: Radiation Oncology

## 2019-09-19 ENCOUNTER — Ambulatory Visit
Admission: RE | Admit: 2019-09-19 | Discharge: 2019-09-19 | Disposition: A | Discharge status: Home / Self Care | Payer: Medicare Other | Source: Ambulatory Visit | Attending: Radiation Oncology | Admitting: Radiation Oncology

## 2019-09-19 ENCOUNTER — Other Ambulatory Visit: Payer: Self-pay

## 2019-09-19 DIAGNOSIS — R590 Localized enlarged lymph nodes: Secondary | ICD-10-CM | POA: Diagnosis not present

## 2019-09-19 DIAGNOSIS — R0789 Other chest pain: Secondary | ICD-10-CM | POA: Diagnosis not present

## 2019-09-19 DIAGNOSIS — Z510 Encounter for antineoplastic radiation therapy: Secondary | ICD-10-CM | POA: Insufficient documentation

## 2019-09-19 DIAGNOSIS — C3412 Malignant neoplasm of upper lobe, left bronchus or lung: Secondary | ICD-10-CM | POA: Insufficient documentation

## 2019-09-19 DIAGNOSIS — R7309 Other abnormal glucose: Secondary | ICD-10-CM | POA: Diagnosis not present

## 2019-09-19 NOTE — Telephone Encounter (Signed)
Attempted to reach pt.

## 2019-09-19 NOTE — Progress Notes (Signed)
Radiation Oncology         [phone] ________________________________  Outpatient Follow Up - Conducted via telephone due to current COVID-19 concerns for limiting patient exposure  I spoke with the patient to conduct this consult visit via telephone to spare the patient unnecessary potential exposure in the healthcare setting during the current COVID-19 pandemic. The patient was notified in advance and was offered a [place] meeting to allow for face to face communication but unfortunately reported that they did not have the appropriate resources/technology to support such a visit and instead preferred to proceed with a telephone visit.   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 The patient received 60Gy in27factions at 12Gyper fraction.   Narrative:  Mr. SGranthamis a pleasant 69y.o. gentleman with a history of multifocal stage I NSCLC of the left lung. The patient was originally diagnosed with an adenosquamous carcinoma of the LUL in 2019 and received SBRT as he was not a candidate for surgical resection. He has been followed in surveillance in our clinic and was noted to have a new enlarging lesion in the left suprahilar region. He did undergo further work up with PET in March 2020 showed significant hypermetabolism in the suprahilar lesion as well as an exophytic lesion in the left kidney. He proceeded with SBRT to  the suprahilar lesion and post treatment CT chest on 08/16/2018 revealed post radiotherapy change in the LUL and no solid nodule was visible. There was also postradiotherapy change in the LUL closer to the site of the suprahilar treatment. He had an MRI abdomen on 09/12/2018 that revealed a solid 2.7 cm solid mass in the left kidney consistent with renal cell carcinoma and met with Dr. HLouis Meckel On 10/31/2018 he underwent CT guided ablation of the left renal lesion by Dr. YKathlene Coteand he follows along with Dr. HLouis Meckelin urology. He has been NED in the lungs, but recent CT on 09/10/19 revealed concerns for new adenopathy in the AP window region. Dr. MLisbeth Renshawevaluated this and recommended a PET scan that was performed on 09/17/19 revealing hypermetabolism in the prevascular node measuring 14 mm (4 mm previously on CT in December 2020) with an SUV of 36.1. There was right axillary skin thickening and hypermetabolism of 5.4 SUV, and hypermetabolism in bilateral parotid nodules that were read as stable from prior PET with an SUV of 9.1, though it was 6.6 previously. His case was discussed in thoracic oncology conference this morning. No additional tissue diagnosis was felt to be needed prior to proceeding with ChemoRT. He's contacted to review these recommendations.  On review of systems the patient states he is doing okay without any new complaints, though he has baseline low back pain, chest wall pain, and occasional shortness of breath with exertion.   PAST MEDICAL HISTORY:  Past Medical History:  Diagnosis Date  . [place]   . Arthritis   . Chronic low back pain   . COPD (  chronic obstructive pulmonary disease) ([place])   . Coronary artery calcification seen on CT scan    Multivessel  . Essential hypertension   . Gait instability    Previous logging accident  . GERD (gastroesophageal reflux disease)   . Lung cancer ([place])    Poorly differentiated, XRT  . Renal cell carcinoma ([place])    Left,  status post cryoablation August 2020    PAST SURGICAL HISTORY: Past Surgical History:  Procedure Laterality Date  . CHOLECYSTECTOMY     2019  . FRACTURE SURGERY     bil legs (logging trees)  . IR RADIOLOGIST EVAL & MGMT  09/27/2018  . IR RADIOLOGIST EVAL & MGMT  11/22/2018  . IR RADIOLOGIST EVAL & MGMT  04/10/2019  . NECK SURGERY  1994-1995  . RADIOLOGY WITH ANESTHESIA Left 10/31/2018   Procedure: CT WITH ANESTHESIA RENAL CRYO ABLATION;  Surgeon: Aletta Edouard, MD;  Location: [facility];  Service: Radiology;  Laterality: Left;    PAST SOCIAL HISTORY:  Social History   Socioeconomic History  . Marital status: Single    Spouse name: Not on file  . Number of children: Not on file  . Years of education: Not on file  . Highest education level: Not on file  Occupational History  . Not on file  Tobacco Use  . Smoking status: Current Every Day Smoker    Packs/day: 0.50  . Smokeless tobacco: Never Used  . Tobacco comment: 3 a day  Vaping Use  . Vaping Use: Never used  Substance and Sexual Activity  . Alcohol use: Never    Comment: BEER ONCE IN A WHILE   . Drug use: Never  . Sexual activity: Not Currently  Other Topics Concern  . Not on file  Social History Narrative   10-23-17 Unable to ask abuse questions wife and other family with him today.   Social Determinants of Health   Financial Resource Strain:   . Difficulty of Paying Living Expenses:   Food Insecurity:   . Worried About Charity fundraiser in the Last Year:   . Arboriculturist in the Last Year:   Transportation Needs:   . Film/video editor (Medical):   Marland Kitchen Lack of Transportation (Non-Medical):   Physical Activity:   . Days of Exercise per Week:   . Minutes of Exercise per Session:   Stress:   . Feeling of Stress :   Social Connections:   . Frequency of Communication with Friends and Family:   . Frequency of Social Gatherings with Friends and Family:   . Attends Religious Services:   . Active Member of  Clubs or Organizations:   . Attends Archivist Meetings:   Marland Kitchen Marital Status:   Intimate Partner Violence:   . Fear of Current or Ex-Partner:   . Emotionally Abused:   Marland Kitchen Physically Abused:   . Sexually Abused:     PAST FAMILY HISTORY: Family History  Problem Relation Age of Onset  . Heart attack Mother   . Renal Disease Mother   . Heart failure Father   . Heart attack Father   . Diabetes Sister   . Heart disease Brother     MEDICATIONS  Current Outpatient Medications  Medication Sig Dispense Refill  . amLODipine (NORVASC) 10 MG tablet Take 10 mg by mouth daily.    Marland Kitchen aspirin 81 MG EC tablet Take 81 mg by mouth daily. Swallow whole.    Marland Kitchen buPROPion (WELLBUTRIN XL) 300 MG 24  hr tablet Take 300 mg by mouth daily.    . busPIRone (BUSPAR) 15 MG tablet Take 15 mg by mouth daily.    . cetirizine (ZYRTEC) 10 MG tablet Take 10 mg by mouth daily as needed for allergies.    . furosemide (LASIX) 40 MG tablet Take 40 mg by mouth daily.     . hydrochlorothiazide (MICROZIDE) 12.5 MG capsule Take 12.5 mg by mouth daily.    Marland Kitchen liothyronine (CYTOMEL) 25 MCG tablet Take 25 mcg by mouth daily.    Marland Kitchen losartan (COZAAR) 100 MG tablet Take 100 mg by mouth daily.    . Multiple Vitamins-Minerals (CENTRUM SILVER 50+MEN) TABS Take 1 tablet by mouth daily.    Marland Kitchen oxyCODONE-acetaminophen (PERCOCET) 10-325 MG tablet Take 1 tablet by mouth every 4 (four) hours as needed for pain.    . pantoprazole (PROTONIX) 40 MG tablet Take 40 mg by mouth every evening.     . Probiotic Product (PROBIOTIC DAILY PO) Take 1 capsule by mouth daily.    . rosuvastatin (CRESTOR) 5 MG tablet Take 5 mg by mouth at bedtime.    . sildenafil (VIAGRA) 100 MG tablet Take 100 mg by mouth daily as needed for erectile dysfunction.     No current facility-administered medications for this encounter.    ALLERGIES: No Known Allergies  PHYSICAL EXAM: Unable to assess due to encounter type.   Impression/Plan: 1. 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 I reviewed the recommendations from conference with the patient and the rationale for chemoRT given his nodal involvement. We discussed the risks, benefits, short, and long term effects of radiotherapy, and the patient is interested in proceeding. Dr. Lisbeth Renshaw is out of town but I will update him with the discussion from conference. Typically he would offer a patient a course of 6 1/2 weeks of radiotherapy. The patient is in agreement to proceed and will come in on Tuesday next week for simulation. We're coordinating medical oncology referral as well with the hopes of being able to start treatment on 09/30/19. 2. Left Chest wall pain. The patient has never had a fracture from prior radiotherapy but neuralgia has been managed with gabapentin and he also takes this for peripheral neuropathy so his gabapentin is managed by his PCP. 3. Left renal cell carcinoma. The patient will continue to follow up with Dr. [name] and Dr. Kathlene Cote.  4. Recent elevation in blood glucose. The patient is interested in following up with his PCP. His fasting blood glucose prior to PET was 111. I'm not overly concerned in one reading, but he would like his PCP to know about this also in lieu of upcoming chemotherapy and the use of dexamethasone with this regimen.  Given current concerns for patient exposure during the COVID-19 pandemic, this encounter was conducted via telephone.  The patient has given verbal consent for this type of encounter. The time spent during this encounter was 45 minutes and 50% of that time was spent in the coordination of his care. The attendants for this meeting include Shona Simpson, [facility] and BARNES FLOREK and his sister [name]. During the encounter, Shona Simpson [facility] was located at [facility] Radiation Oncology Department.  [name] and his sister Greely Atiyeh were located at home.    Carola Rhine, PAC

## 2019-09-20 ENCOUNTER — Telehealth: Payer: Self-pay | Admitting: *Deleted

## 2019-09-20 ENCOUNTER — Telehealth: Payer: Self-pay | Admitting: Physician Assistant

## 2019-09-20 NOTE — Telephone Encounter (Signed)
Received an urgent new pt referral from Dr. Lisbeth Renshaw for lung cancer. Mr. Reginald Berry has been cld and scheduled to see [name] on 7/21 at 130pm w/labs at 1pm. Pt aware to arrive 15 minutes early.

## 2019-09-23 ENCOUNTER — Ambulatory Visit: Payer: Medicare Other | Admitting: Radiation Oncology

## 2019-09-24 ENCOUNTER — Ambulatory Visit
Admission: RE | Admit: 2019-09-24 | Discharge: 2019-09-24 | Disposition: A | Discharge status: Home / Self Care | Payer: Medicare Other | Source: Ambulatory Visit | Attending: Radiation Oncology | Admitting: Radiation Oncology

## 2019-09-24 ENCOUNTER — Other Ambulatory Visit: Payer: Self-pay | Admitting: *Deleted

## 2019-09-24 ENCOUNTER — Encounter (HOSPITAL_COMMUNITY): Payer: Medicare Other

## 2019-09-24 ENCOUNTER — Other Ambulatory Visit: Payer: Self-pay

## 2019-09-24 DIAGNOSIS — Z510 Encounter for antineoplastic radiation therapy: Secondary | ICD-10-CM | POA: Diagnosis not present

## 2019-09-24 DIAGNOSIS — C3412 Malignant neoplasm of upper lobe, left bronchus or lung: Secondary | ICD-10-CM | POA: Diagnosis not present

## 2019-09-24 NOTE — Progress Notes (Signed)
I consented the patient today for chemoRT. I evaluated his neck area and did not appreciate any visible lesions or specific findings to correlate with his hypermetabolic changes of the neck or parotid regions. We will follow this expectantly.    Carola Rhine, PAC

## 2019-09-24 NOTE — Progress Notes (Signed)
[place] [phone]   [phone]  CONSULT NOTE  REFERRING PHYSICIAN: Dr. Lisbeth Renshaw   REASON FOR CONSULTATION: Hypermetabolic adenopathy in the AP window. History of NSCLC.   HPI Reginald Berry is a 68 y.o. male with a past medical history significant for multifocal non-small cell lung cancer, renal cell carcinoma, back, neck, and leg weakness secondary to a walking accident, hypertension, anxiety, hyperlipidemia, and status post cholecystectomy is referred to the clinic for evaluation of abnormal imaging studies.  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  This was treated with SBRT under the care of Dr. Solon Augusta PA-C.  The patient had evidence of a new left suprahilar mass and a exophytic lesion in the left kidney in March 2020.  The patient then underwent SBRT.  The left kidney lesion was biopsied and was consistent with renal cell carcinoma.  The patient is followed by Dr. [name] from urology.  This was diagnosed in July 2020.  The patient had a CT-guided ablation by Dr. Kathlene Cote in interventional radiology.  Recently, the patient had a repeat surveillance CT scan of the chest on 09/10/2019 which noted a new enlarged prevascular mediastinal lymph node.  The patient had a PET scan on 09/17/2019 to further evaluate this which noted hypermetabolism of this prevascular lymph node consistent with metastatic disease.  The patient was then referred to the clinic today for evaluation and more detailed discussion about his current condition and recommended treatment options.  Radiation oncology is planning on a 6 weeks course of concurrent chemoradiation.  He has his first appointment for reported treat on 10/01/2019.  The patient was accompanied by his sister today.  The patient states that he "good today" except for his chronic back pain for which he is prescribed Percocet.  He denies any recent fever,  chills, night sweats, or unexpected weight loss.  He denies any chest pain or hemoptysis.  He reports his baseline shortness of breath with exertion and a chronic cough since undergoing treatment for his lung cancer in 2019.  The patient denies any nausea, vomiting, or diarrhea.  He has some constipation which is managed with a laxative.  He denies any headache or visual changes.    The patient's family history consists of a mother who had diabetes mellitus, scoliosis, ovarian cancer, and heart disease.  The patient's father passed away from a myocardial infarction.   The patient used to work as a Acupuncturist.  He is single and does not have any children.  The patient denies any history of drug use.  The patient rarely drinks alcohol and states that he has roughly 1 beer per month.  The patient has been smoking about 1 to 1-1/2 packs of cigarettes for the last 50 years ago.  Since being diagnosed with lung cancer in 2019, the patient states that 1 pack of cigarettes will last him about 2 and half days presently. HPI  Past Medical History:  Diagnosis Date  . Aortic atherosclerosis ([employer])   . Arthritis   . Chronic low back pain   . COPD (chronic obstructive pulmonary disease) ([place])   . Coronary artery calcification seen on CT scan    Multivessel  . Essential hypertension   . Gait instability    Previous logging accident  . GERD (gastroesophageal reflux disease)   . Lung cancer ([place])    Poorly differentiated, XRT  . Renal cell carcinoma ([facility])    Left,  status post cryoablation August 2020    Past Surgical History:  Procedure Laterality Date  . CHOLECYSTECTOMY     2019  . FRACTURE SURGERY     bil legs (logging trees)  . IR RADIOLOGIST EVAL & MGMT  09/27/2018  . IR RADIOLOGIST EVAL & MGMT  11/22/2018  . IR RADIOLOGIST EVAL & MGMT  04/10/2019  . NECK SURGERY  1994-1995  . RADIOLOGY WITH ANESTHESIA Left 10/31/2018   Procedure: CT WITH ANESTHESIA RENAL CRYO ABLATION;  Surgeon: Aletta Edouard, MD;   Location: [facility];  Service: Radiology;  Laterality: Left;    Family History  Problem Relation Age of Onset  . Heart attack Mother   . Renal Disease Mother   . Heart failure Father   . Heart attack Father   . Diabetes Sister   . Heart disease Brother     Social History Social History   Tobacco Use  . Smoking status: Current Every Day Smoker    Packs/day: 0.50  . Smokeless tobacco: Never Used  . Tobacco comment: 3 a day  Vaping Use  . Vaping Use: Never used  Substance Use Topics  . Alcohol use: Never    Comment: BEER ONCE IN A WHILE   . Drug use: Never    No Known Allergies  Current Outpatient Medications  Medication Sig Dispense Refill  . amLODipine (NORVASC) 10 MG tablet Take 10 mg by mouth daily.    Marland Kitchen aspirin 81 MG EC tablet Take 81 mg by mouth daily. Swallow whole.    Marland Kitchen buPROPion (WELLBUTRIN XL) 300 MG 24 hr tablet Take 300 mg by mouth daily.    . busPIRone (BUSPAR) 15 MG tablet Take 15 mg by mouth daily.    . cetirizine (ZYRTEC) 10 MG tablet Take 10 mg by mouth daily as needed for allergies.    . furosemide (LASIX) 40 MG tablet Take 40 mg by mouth daily.     . hydrochlorothiazide (MICROZIDE) 12.5 MG capsule Take 12.5 mg by mouth daily.    Marland Kitchen liothyronine (CYTOMEL) 25 MCG tablet Take 25 mcg by mouth daily.    Marland Kitchen losartan (COZAAR) 100 MG tablet Take 100 mg by mouth daily.    . Multiple Vitamins-Minerals (CENTRUM SILVER 50+MEN) TABS Take 1 tablet by mouth daily.    Marland Kitchen oxyCODONE-acetaminophen (PERCOCET) 10-325 MG tablet Take 1 tablet by mouth every 4 (four) hours as needed for pain.    . pantoprazole (PROTONIX) 40 MG tablet Take 40 mg by mouth every evening.     . Probiotic Product (PROBIOTIC DAILY PO) Take 1 capsule by mouth daily.    . rosuvastatin (CRESTOR) 5 MG tablet Take 5 mg by mouth at bedtime.    . sildenafil (VIAGRA) 100 MG tablet Take 100 mg by mouth daily as needed for erectile dysfunction.    . prochlorperazine (COMPAZINE) 10 MG tablet Take 1 tablet (10 mg  total) by mouth every 6 (six) hours as needed. 30 tablet 2   No current facility-administered medications for this visit.    Review of Systems REVIEW OF SYSTEMS:   Review of Systems  Constitutional: Positive for fatigue and generalized weakness.  Negative for appetite change, chills, fever and unexpected weight change.  HENT: Negative for mouth sores, nosebleeds, sore throat and trouble swallowing.   Eyes: Negative for eye problems and icterus.  Respiratory: Positive for chronic cough and shortness of breath.  Negative for hemoptysis and wheezing.   Cardiovascular: Positive for bilateral lower extremity swelling. negative for chest pain. Gastrointestinal:  Negative for abdominal pain, constipation, diarrhea, nausea and vomiting.  Genitourinary: Negative for bladder incontinence, difficulty urinating, dysuria, frequency and hematuria.   Musculoskeletal: Positive for chronic neck and back pain secondary to an old injury.   Skin: Positive for multiple scabs on his extremities. Neurological: Negative for dizziness, extremity weakness, gait problem, headaches, light-headedness and seizures.  Hematological: Negative for adenopathy. Does not bruise/bleed easily.  Psychiatric/Behavioral: Negative for confusion, depression and sleep disturbance. The patient is not nervous/anxious.     PHYSICAL EXAMINATION:  Blood pressure (!) 145/84, pulse 80, temperature 98.3 F (36.8 C), temperature source Oral, resp. rate (!) 22, SpO2 99 %.  ECOG PERFORMANCE STATUS: 2  Physical Exam  Constitutional: Oriented to person, place, and time and appears older than stated age.  No acute distress. HENT:  Head: Normocephalic and atraumatic.  Mouth/Throat: Oropharynx is clear and moist. No oropharyngeal exudate.  Eyes: Conjunctivae are normal. Right eye exhibits no discharge. Left eye exhibits no discharge. No scleral icterus.  Neck: Normal range of motion. Neck supple.  Cardiovascular: Normal rate, regular rhythm,  normal heart sounds and intact distal pulses.   Pulmonary/Chest: Effort normal and breath sounds normal. No respiratory distress. No wheezes. No rales.  Abdominal: Soft. Bowel sounds are normal. Exhibits no distension and no mass. There is no tenderness.  Musculoskeletal: Bilateral lower extremity edema. normal range of motion.   Lymphadenopathy:    No cervical adenopathy.  Neurological: Alert and oriented to person, place, and time.  Exhibits muscle wasting.  The patient was examined in the wheelchair.   Skin: Skin is warm and dry.  Chronic venous insufficiency in lower extremities.  Several scabs on the patient's extremities.  Not diaphoretic. No pallor.  Psychiatric: Mood, memory and judgment normal.  Vitals reviewed.  LABORATORY DATA: Lab Results  Component Value Date   WBC 7.8 09/25/2019   HGB 14.6 09/25/2019   HCT 44.7 09/25/2019   MCV 87.6 09/25/2019   PLT 238 09/25/2019      Chemistry      Component Value Date/Time   NA 140 09/25/2019 1309   K 4.3 09/25/2019 1309   CL 109 09/25/2019 1309   CO2 22 09/25/2019 1309   BUN 15 09/25/2019 1309   CREATININE 1.15 09/25/2019 1309      Component Value Date/Time   CALCIUM 9.0 09/25/2019 1309   ALKPHOS 107 09/25/2019 1309   AST 15 09/25/2019 1309   ALT 15 09/25/2019 1309   BILITOT 0.5 09/25/2019 1309       RADIOGRAPHIC STUDIES: CT CHEST W CONTRAST  Result Date: 09/11/2019 CLINICAL DATA:  Non-small cell lung cancer. Restaging LEFT upper lobe stage I cancer. Status SBRT EXAM: CT CHEST WITH CONTRAST TECHNIQUE: Multidetector CT imaging of the chest was performed during intravenous contrast administration. CONTRAST:  34mL OMNIPAQUE IOHEXOL 300 MG/ML  SOLN COMPARISON:  None. FINDINGS: Cardiovascular: Coronary artery calcification and aortic atherosclerotic calcification. Mediastinum/Nodes: No axillary or supraclavicular adenopathy. Prevascular lymph node measuring 14 mm (image 52/series 2) is increased from 4 mm on CT 02/19/2019.  Lungs/Pleura: Band peribronchial thickening in the LEFT upper lobe is similar comparison exam. Central nodular portion measures 2.5 by 1.5 cm unchanged. There is some new peribronchial consolidation anteriorly on image 53/2. LEFT lower lobe is clear. RIGHT lung is clear. Upper Abdomen: Thickening of the adrenal glands similar prior. No upper abdominal adenopathy. Musculoskeletal: No aggressive osseous lesion. IMPRESSION: 1. Newly enlarged prevascular mediastinal lymph node is concerning for new LYMPH NODE METASTASIS. 2. Mild increase in peribronchial thickening and  consolidation in the LEFT upper lobe is most likely post radiation change. These results will be called to the ordering clinician or representative by the Radiologist Assistant, and communication documented in the PACS or [employer]. Electronically Signed   By: Suzy Bouchard M.D.   On: 09/11/2019 10:49   NM PET Image Restag (PS) Skull Base To Thigh  Result Date: 09/17/2019 CLINICAL DATA:  Subsequent treatment strategy for non-small-cell lung cancer with developing adenopathy. radiation therapy in 2019. EXAM: NUCLEAR MEDICINE PET SKULL BASE TO THIGH TECHNIQUE: 17.0 mCi F-18 FDG was injected intravenously. Full-ring PET imaging was performed from the skull base to thigh after the radiotracer. CT data was obtained and used for attenuation correction and anatomic localization. Fasting blood glucose: 111 mg/dl COMPARISON:  Chest CT 09/11/2019.  Most recent PET of 05/21/2018 FINDINGS: Mediastinal blood pool activity: SUV max 3.3 Liver activity: SUV max NA NECK: The previously described hypermetabolic posterior left neck lesion is improved to resolved. There is residual skin thickening and low-level hypermetabolism in this region, including at a S.U.V. max of 3.9 on 19/4. Bilateral hypermetabolic parotid nodules are again identified. Example on the left at 5 mm and a S.U.V. max of 9.1 today versus a S.U.V. max of 6.6 on the prior exam. Incidental CT  findings: Bilateral carotid atherosclerosis. No cervical adenopathy. CHEST: Hypermetabolism corresponding to the prevascular node of concern. 1.4 cm and a S.U.V. max of 36.1 on 61/4. Similar in size to 09/10/2019 CT. Left upper lobe patchy hypermetabolism is likely radiation induced. Right axillary skin thickening and hypermetabolism are new. Example at 6 mm and a S.U.V. max of 5.4 on 62/4. Incidental CT findings: Deferred to recent diagnostic CT. Aortic and coronary artery atherosclerosis. Pulmonary artery enlargement, outflow tract 3.4 cm. ABDOMEN/PELVIS: Low-level hypermetabolism within prominent but not pathologically sized left inguinal nodes, likely reactive. No suspicious abdominopelvic nodal or parenchymal hypermetabolism identified. Incidental CT findings: Cholecystectomy. Left greater than right adrenal gland nodularity, similar. Low-density bilateral renal lesions are likely cysts. Abdominal aortic atherosclerosis. Small fat containing right inguinal hernia. SKELETON: No abnormal marrow activity. Incidental CT findings: none IMPRESSION: 1. The newly enlarged prevascular node is markedly hypermetabolic, consistent with nodal metastasis. 2. New right axillary skin thickening and hypermetabolism, nonspecific. Consider physical exam correlation. 3. No evidence of hypermetabolic extrathoracic metastasis. 4. Incidental findings, including: Coronary artery atherosclerosis. Aortic Atherosclerosis (ICD10-I70.0). Chronic small hypermetabolic parotid nodules, likely benign. Pulmonary artery enlargement suggests pulmonary arterial hypertension. Electronically Signed   By: Abigail Miyamoto M.D.   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  Diagnosed in 2019. 2) stage Ia3 (T1cN0M0) non-small cell lung cancer.  Diagnosed with a left suprahilar region  with AP window adenopathy.  Status post SBRT 3) renal cell carcinoma status post CT-guided ablation in July 2020.  Followed by Dr. [name]. 4) recurrent non-small cell lung cancer in July 2021.  Patient presented with new adenopathy in the AP window.   PLAN: The patient was seen with Dr. Julien Nordmann today.  Dr. Julien Nordmann had a lengthy discussion with the patient about his current condition and recommended treatment options.  Dr. Julien Nordmann recommends the patient undergo weekly concurrent chemoradiation with carboplatin for an AUC of 2 and paclitaxel 45 mg per metered squared.  The patient is interested in this option and he is expected to receive his first dose  of treatment on 09/30/2019.  The adverse side effects of treatment were discussed including but not limited to alopecia, nausea, vomiting, myelosuppression, kidney, and liver dysfunction.  I will arrange for a chemo education class prior to receiving his first cycle of treatment.  I sent a prescription for Compazine 10 mg p.o. every 6 hours as needed for nausea to the patient's pharmacy.   We will see him back for follow-up visit in 2 weeks for evaluation before starting cycle #2.  We will reach out to Dr. Servando Snare, cardiothoracic surgeon, to see if his adenopathy is accessible to biopsy. If it is not accessible, we will proceed with concurrent chemoradiation as planned.   The patient voices understanding of current disease status and treatment options and is in agreement with the current care plan. First bipsy to confirm. If not, then just treat. Tuesday,   All questions were answered. The patient knows to call the clinic with any problems, questions or concerns. We can certainly see the patient much sooner if necessary.  Thank you so much for allowing me to participate in the care of Reginald Berry. I will continue to follow up the patient with you and assist in his care.  The total time spent in the appointment was 60  minutes.  Disclaimer: This note was dictated with voice recognition software. Similar sounding words can inadvertently be transcribed and may not be corrected upon review.   Fantashia Shupert L Deosha Werden September 25, 2019, 4:30 PM  ADDENDUM: Hematology/Oncology Attending: I had a face-to-face encounter with the patient today.  I recommended his care plan.  This is a very pleasant 69 years old obese white male with history of adenosquamous cell carcinoma, stage I involving the left upper lobe diagnosed in 2019 status post SBRT.  In March 2020 the patient was also diagnosed with recurrent disease presented with new left suprahilar mass as well as exophytic left kidney mass.  The patient was treated again with SBRT to the recurrent lung cancer.  He was also treated with ablation to the left kidney renal cell carcinoma. He continues on routine surveillance and in early July 2020 1 repeat CT scan of the chest showed newly enlargement prevascular mediastinal lymph node concerning for new lymph node metastasis.  This was followed by a PET scan and it showed hypermetabolism corresponding to the prevascular node of concern measuring 1.4 cm with SUV max of 36.1.  There was also left upper lobe patchy hypermetabolism likely radiation induced in the right axillary skin thickening and hypermetabolism that is new with SUV max of 5.4.  The scan also showed bilateral hypermetabolic parotid nodules.  There was no evidence of hypermetabolic extrathoracic metastasis. I had a lengthy discussion with the patient and his sister today about his current disease status and treatment options. I recommended for the patient to discuss his condition with Dr. Servando Snare to see if there is any possibility for biopsy of the prevascular lymph node for confirmation of the tissue diagnosis.  I understand the site of metastasis very difficult for metastasis and if Dr. Servando Snare declined the procedure, we will treat him empirically with a course of  concurrent chemoradiation with weekly carboplatin for AUC of 2 and paclitaxel 45 mg/M2 for 6-7 weeks.  This will be followed by restaging scan and consideration of consolidation immunotherapy with Imfinzi. I discussed with the patient the adverse effect of this treatment including but not limited to alopecia, myelosuppression, nausea and vomiting, peripheral neuropathy, liver or renal dysfunction. The patient is  interested in proceeding with the treatment and he is expected to start the first cycle of this treatment next week. He will have a chemotherapy education class before the first dose of his treatment.  We will call his pharmacy with prescription for Compazine 10 mg p.o. every 6 hours as needed for nausea. The patient was advised to call immediately if he has any concerning symptoms in the interval.  Disclaimer: This note was dictated with voice recognition software. Similar sounding words can inadvertently be transcribed and may be missed upon review. Eilleen Kempf, MD 09/25/19

## 2019-09-25 ENCOUNTER — Other Ambulatory Visit: Payer: Self-pay | Admitting: *Deleted

## 2019-09-25 ENCOUNTER — Other Ambulatory Visit: Payer: Self-pay

## 2019-09-25 ENCOUNTER — Encounter: Payer: Self-pay | Admitting: *Deleted

## 2019-09-25 ENCOUNTER — Inpatient Hospital Stay: Payer: Medicare Other | Attending: Physician Assistant | Admitting: Physician Assistant

## 2019-09-25 ENCOUNTER — Encounter: Payer: Self-pay | Admitting: Physician Assistant

## 2019-09-25 ENCOUNTER — Other Ambulatory Visit: Payer: Self-pay | Admitting: Internal Medicine

## 2019-09-25 ENCOUNTER — Inpatient Hospital Stay: Payer: Medicare Other

## 2019-09-25 VITALS — BP 145/84 | HR 80 | Temp 98.3°F | Resp 22

## 2019-09-25 DIAGNOSIS — F419 Anxiety disorder, unspecified: Secondary | ICD-10-CM

## 2019-09-25 DIAGNOSIS — Z5111 Encounter for antineoplastic chemotherapy: Secondary | ICD-10-CM | POA: Diagnosis not present

## 2019-09-25 DIAGNOSIS — Z8041 Family history of malignant neoplasm of ovary: Secondary | ICD-10-CM | POA: Insufficient documentation

## 2019-09-25 DIAGNOSIS — Z7189 Other specified counseling: Secondary | ICD-10-CM | POA: Insufficient documentation

## 2019-09-25 DIAGNOSIS — I10 Essential (primary) hypertension: Secondary | ICD-10-CM | POA: Diagnosis not present

## 2019-09-25 DIAGNOSIS — R05 Cough: Secondary | ICD-10-CM

## 2019-09-25 DIAGNOSIS — F17210 Nicotine dependence, cigarettes, uncomplicated: Secondary | ICD-10-CM

## 2019-09-25 DIAGNOSIS — K5900 Constipation, unspecified: Secondary | ICD-10-CM

## 2019-09-25 DIAGNOSIS — C642 Malignant neoplasm of left kidney, except renal pelvis: Secondary | ICD-10-CM | POA: Insufficient documentation

## 2019-09-25 DIAGNOSIS — C3412 Malignant neoplasm of upper lobe, left bronchus or lung: Secondary | ICD-10-CM | POA: Diagnosis not present

## 2019-09-25 DIAGNOSIS — M545 Low back pain: Secondary | ICD-10-CM | POA: Diagnosis not present

## 2019-09-25 DIAGNOSIS — R599 Enlarged lymph nodes, unspecified: Secondary | ICD-10-CM

## 2019-09-25 DIAGNOSIS — R0609 Other forms of dyspnea: Secondary | ICD-10-CM | POA: Insufficient documentation

## 2019-09-25 DIAGNOSIS — C3492 Malignant neoplasm of unspecified part of left bronchus or lung: Secondary | ICD-10-CM

## 2019-09-25 DIAGNOSIS — K118 Other diseases of salivary glands: Secondary | ICD-10-CM | POA: Insufficient documentation

## 2019-09-25 DIAGNOSIS — E669 Obesity, unspecified: Secondary | ICD-10-CM | POA: Diagnosis not present

## 2019-09-25 DIAGNOSIS — G8929 Other chronic pain: Secondary | ICD-10-CM | POA: Diagnosis not present

## 2019-09-25 LAB — CMP (CANCER CENTER ONLY)
ALT: 15 U/L (ref 0–44)
AST: 15 U/L (ref 15–41)
Albumin: 3.2 g/dL — ABNORMAL LOW (ref 3.5–5.0)
Alkaline Phosphatase: 107 U/L (ref 38–126)
Anion gap: 9 (ref 5–15)
BUN: 15 mg/dL (ref 8–23)
CO2: 22 mmol/L (ref 22–32)
Calcium: 9 mg/dL (ref 8.9–10.3)
Chloride: 109 mmol/L (ref 98–111)
Creatinine: 1.15 mg/dL (ref 0.61–1.24)
GFR, Est AFR Am: >60 mL/min (ref >60)
GFR, Estimated: >60 mL/min (ref >60)
Glucose, Bld: 110 mg/dL — ABNORMAL HIGH (ref 70–99)
Potassium: 4.3 mmol/L (ref 3.5–5.1)
Sodium: 140 mmol/L (ref 135–145)
Total Bilirubin: 0.5 mg/dL (ref 0.3–1.2)
Total Protein: 7 g/dL (ref 6.5–8.1)

## 2019-09-25 LAB — CBC WITH DIFFERENTIAL (CANCER CENTER ONLY)
Abs Immature Granulocytes: 0.03 10*3/uL (ref 0.00–0.07)
Basophils Absolute: 0 10*3/uL (ref 0.0–0.1)
Basophils Relative: 1 %
Eosinophils Absolute: 0.2 10*3/uL (ref 0.0–0.5)
Eosinophils Relative: 2 %
HCT: 44.7 % (ref 39.0–52.0)
Hemoglobin: 14.6 g/dL (ref 13.0–17.0)
Immature Granulocytes: 0 %
Lymphocytes Relative: 25 %
Lymphs Abs: 1.9 10*3/uL (ref 0.7–4.0)
MCH: 28.6 pg (ref 26.0–34.0)
MCHC: 32.7 g/dL (ref 30.0–36.0)
MCV: 87.6 fL (ref 80.0–100.0)
Monocytes Absolute: 0.6 10*3/uL (ref 0.1–1.0)
Monocytes Relative: 8 %
Neutro Abs: 5 10*3/uL (ref 1.7–7.7)
Neutrophils Relative %: 64 %
Platelet Count: 238 10*3/uL (ref 150–400)
RBC: 5.1 MIL/uL (ref 4.22–5.81)
RDW: 15.2 % (ref 11.5–15.5)
WBC Count: 7.8 10*3/uL (ref 4.0–10.5)
nRBC: 0 % (ref 0.0–0.2)

## 2019-09-25 MED ORDER — PROCHLORPERAZINE MALEATE 10 MG PO TABS: 10.0000 mg | ORAL_TABLET | Freq: Four times a day (QID) | ORAL | 2 refills | Status: DC | PRN

## 2019-09-25 NOTE — Patient Instructions (Signed)
-  There are two main categories of lung cancer, they are named based on the size of the cancer cell. One is called Non-Small cell lung cancer. The other type is Small Cell Lung Cancer -The sample (biopsy) that they took of your tumor was consistent with a subtype of Non-small cell lung cancer called Adenosquamous.  -We covered a lot of important information at your appointment today regarding what the treatment plan is moving forward. Here are the the main points that were discussed at your office visit with [place] today:  -The treatment that you will receive consists of two chemotherapy drugs called Carboplatin and Paclitaxel (also called Taxol) -We are planning on starting your treatment next week on 10/01/19 but before your start your treatment, I would like you to attend a Chemotherapy Education Class. This involves having you sit down with one of our nurse educators. She will discuss with you one-on-one more details about your treatment as well as general information about resources here at the [facility] treatment will be given every week for about 6 weeks or so (as long as you are also receiving radiation). We will check your labs (blood work) once a week . Dr. Julien Nordmann or I will see you every other treatment just to make sure that you are doing well and that everything is on track. -We will get a CT scan about 3 weeks after you complete your treatment  Medications:  -Compazine was sent to your pharmacy. This medication is for nausea. You may take this every 6 hours as needed if you feel nausous.   Follow Up:  -We will see you back for a follow up visit in 2 weeks

## 2019-09-25 NOTE — Progress Notes (Signed)
START ON PATHWAY REGIMEN - Non-Small Cell Lung     Administer weekly:     Paclitaxel      Carboplatin   **Always confirm dose/schedule in your pharmacy ordering system**  Patient Characteristics: Preoperative or Nonsurgical Candidate (Clinical Staging), Stage III - Nonsurgical Candidate (Nonsquamous and Squamous), PS = 0, 1 Therapeutic Status: Preoperative or Nonsurgical Candidate (Clinical Staging) AJCC T Category: cT1b AJCC N Category: cN2 AJCC M Category: cM0 AJCC 8 Stage Grouping: IIIA ECOG Performance Status: 1 Intent of Therapy: Curative Intent, Discussed with Patient

## 2019-09-25 NOTE — Progress Notes (Signed)
Per Dr. Julien Nordmann, I reached out to Dr. Servando Snare to see if AP window lymph node could be bx.

## 2019-09-26 ENCOUNTER — Inpatient Hospital Stay: Payer: Medicare Other

## 2019-09-27 NOTE — Progress Notes (Signed)
Pharmacist Chemotherapy Monitoring - Initial Assessment    Anticipated start date: 09/30/2019   Regimen:   Are orders appropriate based on the patients diagnosis, regimen, and cycle? Yes  Does the plan date match the patients scheduled date? Yes  Is the sequencing of drugs appropriate? Yes  Are the premedications appropriate for the patients regimen? Yes  Prior Authorization for treatment is: Approved o If applicable, is the correct biosimilar selected based on the patient's insurance? not applicable  Organ Function and Labs:  Are dose adjustments needed based on the patient's renal function, hepatic function, or hematologic function? No  Are appropriate labs ordered prior to the start of patient's treatment? Yes  Other organ system assessment, if indicated: N/A  The following baseline labs, if indicated, have been ordered: N/A  Dose Assessment:  Are the drug doses appropriate? Yes  Are the following correct: o Drug concentrations Yes o IV fluid compatible with drug Yes o Administration routes Yes o Timing of therapy Yes  If applicable, does the patient have documented access for treatment and/or plans for port-a-cath placement? not applicable  If applicable, have lifetime cumulative doses been properly documented and assessed? yes Lifetime Dose Tracking  No doses have been documented on this patient for the following tracked chemicals: Doxorubicin, Epirubicin, Idarubicin, Daunorubicin, Mitoxantrone, Bleomycin, Oxaliplatin, Carboplatin, Liposomal Doxorubicin  o   Toxicity Monitoring/Prevention:  The patient has the following take home antiemetics prescribed: Prochlorperazine  The patient has the following take home medications prescribed: N/A  Medication allergies and previous infusion related reactions, if applicable, have been reviewed and addressed. Yes  The patient's current medication list has been assessed for drug-drug interactions with their chemotherapy  regimen. no significant drug-drug interactions were identified on review.  Order Review:  Are the treatment plan orders signed? Yes  Is the patient scheduled to see a provider prior to their treatment? No  I verify that I have reviewed each item in the above checklist and answered each question accordingly.  Norwood Levo [facility] 09/27/2019 9:41 AM

## 2019-09-30 ENCOUNTER — Other Ambulatory Visit: Payer: Self-pay

## 2019-09-30 ENCOUNTER — Inpatient Hospital Stay: Payer: Medicare Other

## 2019-09-30 VITALS — BP 150/61 | HR 80 | Temp 99.2°F | Resp 24 | Ht 74.0 in

## 2019-09-30 DIAGNOSIS — R0609 Other forms of dyspnea: Secondary | ICD-10-CM | POA: Diagnosis not present

## 2019-09-30 DIAGNOSIS — R05 Cough: Secondary | ICD-10-CM | POA: Diagnosis not present

## 2019-09-30 DIAGNOSIS — C3492 Malignant neoplasm of unspecified part of left bronchus or lung: Secondary | ICD-10-CM

## 2019-09-30 DIAGNOSIS — K118 Other diseases of salivary glands: Secondary | ICD-10-CM | POA: Diagnosis not present

## 2019-09-30 DIAGNOSIS — C3412 Malignant neoplasm of upper lobe, left bronchus or lung: Secondary | ICD-10-CM

## 2019-09-30 DIAGNOSIS — C642 Malignant neoplasm of left kidney, except renal pelvis: Secondary | ICD-10-CM | POA: Diagnosis not present

## 2019-09-30 DIAGNOSIS — G8929 Other chronic pain: Secondary | ICD-10-CM | POA: Diagnosis not present

## 2019-09-30 DIAGNOSIS — Z5111 Encounter for antineoplastic chemotherapy: Secondary | ICD-10-CM | POA: Diagnosis not present

## 2019-09-30 DIAGNOSIS — K5900 Constipation, unspecified: Secondary | ICD-10-CM | POA: Diagnosis not present

## 2019-09-30 DIAGNOSIS — Z510 Encounter for antineoplastic radiation therapy: Secondary | ICD-10-CM | POA: Diagnosis not present

## 2019-09-30 DIAGNOSIS — R599 Enlarged lymph nodes, unspecified: Secondary | ICD-10-CM | POA: Diagnosis not present

## 2019-09-30 DIAGNOSIS — I10 Essential (primary) hypertension: Secondary | ICD-10-CM | POA: Diagnosis not present

## 2019-09-30 DIAGNOSIS — M545 Low back pain: Secondary | ICD-10-CM | POA: Diagnosis not present

## 2019-09-30 LAB — CMP (CANCER CENTER ONLY)
ALT: 15 U/L (ref 0–44)
AST: 15 U/L (ref 15–41)
Albumin: 3.3 g/dL — ABNORMAL LOW (ref 3.5–5.0)
Alkaline Phosphatase: 102 U/L (ref 38–126)
Anion gap: 8 (ref 5–15)
BUN: 15 mg/dL (ref 8–23)
CO2: 22 mmol/L (ref 22–32)
Calcium: 9.5 mg/dL (ref 8.9–10.3)
Chloride: 110 mmol/L (ref 98–111)
Creatinine: 1.11 mg/dL (ref 0.61–1.24)
GFR, Est AFR Am: >60 mL/min (ref >60)
GFR, Estimated: >60 mL/min (ref >60)
Glucose, Bld: 98 mg/dL (ref 70–99)
Potassium: 4.6 mmol/L (ref 3.5–5.1)
Sodium: 140 mmol/L (ref 135–145)
Total Bilirubin: 0.5 mg/dL (ref 0.3–1.2)
Total Protein: 7.3 g/dL (ref 6.5–8.1)

## 2019-09-30 LAB — CBC WITH DIFFERENTIAL (CANCER CENTER ONLY)
Abs Immature Granulocytes: 0.03 10*3/uL (ref 0.00–0.07)
Basophils Absolute: 0.1 10*3/uL (ref 0.0–0.1)
Basophils Relative: 1 %
Eosinophils Absolute: 0.2 10*3/uL (ref 0.0–0.5)
Eosinophils Relative: 2 %
HCT: 44.4 % (ref 39.0–52.0)
Hemoglobin: 14.5 g/dL (ref 13.0–17.0)
Immature Granulocytes: 0 %
Lymphocytes Relative: 25 %
Lymphs Abs: 1.8 10*3/uL (ref 0.7–4.0)
MCH: 28.3 pg (ref 26.0–34.0)
MCHC: 32.7 g/dL (ref 30.0–36.0)
MCV: 86.5 fL (ref 80.0–100.0)
Monocytes Absolute: 0.7 10*3/uL (ref 0.1–1.0)
Monocytes Relative: 11 %
Neutro Abs: 4.3 10*3/uL (ref 1.7–7.7)
Neutrophils Relative %: 61 %
Platelet Count: 238 10*3/uL (ref 150–400)
RBC: 5.13 MIL/uL (ref 4.22–5.81)
RDW: 15.2 % (ref 11.5–15.5)
WBC Count: 7.1 10*3/uL (ref 4.0–10.5)
nRBC: 0 % (ref 0.0–0.2)

## 2019-09-30 MED ORDER — FAMOTIDINE IN NACL 20-0.9 MG/50ML-% IV SOLN
INTRAVENOUS | Status: AC
  Filled 2019-09-30: qty 50

## 2019-09-30 MED ORDER — DIPHENHYDRAMINE HCL 50 MG/ML IJ SOLN
50.0000 mg | Freq: Once | INTRAMUSCULAR | Status: AC
  Administered 2019-09-30: 50 mg via INTRAVENOUS

## 2019-09-30 MED ORDER — SODIUM CHLORIDE 0.9 % IV SOLN
300.0000 mg | Freq: Once | INTRAVENOUS | Status: AC
  Administered 2019-09-30: 300 mg via INTRAVENOUS
  Filled 2019-09-30: qty 30

## 2019-09-30 MED ORDER — DIPHENHYDRAMINE HCL 50 MG/ML IJ SOLN
INTRAMUSCULAR | Status: AC
  Filled 2019-09-30: qty 1

## 2019-09-30 MED ORDER — SODIUM CHLORIDE 0.9 % IV SOLN
20.0000 mg | Freq: Once | INTRAVENOUS | Status: AC
  Administered 2019-09-30: 20 mg via INTRAVENOUS
  Filled 2019-09-30: qty 20

## 2019-09-30 MED ORDER — PALONOSETRON HCL INJECTION 0.25 MG/5ML
0.2500 mg | Freq: Once | INTRAVENOUS | Status: AC
  Administered 2019-09-30: 0.25 mg via INTRAVENOUS

## 2019-09-30 MED ORDER — FAMOTIDINE IN NACL 20-0.9 MG/50ML-% IV SOLN
20.0000 mg | Freq: Once | INTRAVENOUS | Status: AC
  Administered 2019-09-30: 20 mg via INTRAVENOUS

## 2019-09-30 MED ORDER — PALONOSETRON HCL INJECTION 0.25 MG/5ML
INTRAVENOUS | Status: AC
  Filled 2019-09-30: qty 5

## 2019-09-30 MED ORDER — SODIUM CHLORIDE 0.9 % IV SOLN
45.0000 mg/m2 | Freq: Once | INTRAVENOUS | Status: AC
  Administered 2019-09-30: 120 mg via INTRAVENOUS
  Filled 2019-09-30: qty 20

## 2019-09-30 MED ORDER — SODIUM CHLORIDE 0.9 % IV SOLN
Freq: Once | INTRAVENOUS | Status: AC
  Filled 2019-09-30: qty 250

## 2019-09-30 NOTE — Patient Instructions (Signed)
[facility] Discharge Instructions for Patients Receiving Chemotherapy  Today you received the following chemotherapy agents: Pacliatxel and Carboplatin  To help prevent nausea and vomiting after your treatment, we encourage you to take your nausea medication as directed by your MD.   If you develop nausea and vomiting that is not controlled by your nausea medication, call the clinic.   BELOW ARE SYMPTOMS THAT SHOULD BE REPORTED IMMEDIATELY:  *FEVER GREATER THAN 100.5 F  *CHILLS WITH OR WITHOUT FEVER  NAUSEA AND VOMITING THAT IS NOT CONTROLLED WITH YOUR NAUSEA MEDICATION  *UNUSUAL SHORTNESS OF BREATH  *UNUSUAL BRUISING OR BLEEDING  TENDERNESS IN MOUTH AND THROAT WITH OR WITHOUT PRESENCE OF ULCERS  *URINARY PROBLEMS  *BOWEL PROBLEMS  UNUSUAL RASH Items with * indicate a potential emergency and should be followed up as soon as possible.  Feel free to call the clinic should you have any questions or concerns. The clinic phone number is [phone].  Please show the [facility] at check-in to the Emergency Department and triage nurse.  Paclitaxel injection What is this medicine? PACLITAXEL (PAK li TAX el) is a chemotherapy drug. It targets fast dividing cells, like cancer cells, and causes these cells to die. This medicine is used to treat ovarian cancer, breast cancer, lung cancer, Kaposi's sarcoma, and other cancers. This medicine may be used for other purposes; ask your health care provider or pharmacist if you have questions. COMMON BRAND NAME(S): Onxol, Taxol What should I tell my health care provider before I take this medicine? They need to know if you have any of these conditions:  history of irregular heartbeat  liver disease  low blood counts, like low white cell, platelet, or red cell counts  lung or breathing disease, like asthma  tingling of the fingers or toes, or other nerve disorder  an unusual or allergic reaction to paclitaxel,  alcohol, polyoxyethylated castor oil, other chemotherapy, other medicines, foods, dyes, or preservatives  pregnant or trying to get pregnant  breast-feeding How should I use this medicine? This drug is given as an infusion into a vein. It is administered in a hospital or clinic by a specially trained health care professional. Talk to your pediatrician regarding the use of this medicine in children. Special care may be needed. Overdosage: If you think you have taken too much of this medicine contact a poison control center or emergency room at once. NOTE: This medicine is only for you. Do not share this medicine with others. What if I miss a dose? It is important not to miss your dose. Call your doctor or health care professional if you are unable to keep an appointment. What may interact with this medicine? Do not take this medicine with any of the following medications:  disulfiram  metronidazole This medicine may also interact with the following medications:  antiviral medicines for hepatitis, HIV or AIDS  certain antibiotics like erythromycin and clarithromycin  certain medicines for fungal infections like ketoconazole and itraconazole  certain medicines for seizures like carbamazepine, phenobarbital, phenytoin  gemfibrozil  nefazodone  rifampin  St. John's wort This list may not describe all possible interactions. Give your health care provider a list of all the medicines, herbs, non-prescription drugs, or dietary supplements you use. Also tell them if you smoke, drink alcohol, or use illegal drugs. Some items may interact with your medicine. What should I watch for while using this medicine? Your condition will be monitored carefully while you are receiving this medicine. You  will need important blood work done while you are taking this medicine. This medicine can cause serious allergic reactions. To reduce your risk you will need to take other medicine(s) before treatment  with this medicine. If you experience allergic reactions like skin rash, itching or hives, swelling of the face, lips, or tongue, tell your doctor or health care professional right away. In some cases, you may be given additional medicines to help with side effects. Follow all directions for their use. This drug may make you feel generally unwell. This is not uncommon, as chemotherapy can affect healthy cells as well as cancer cells. Report any side effects. Continue your course of treatment even though you feel ill unless your doctor tells you to stop. Call your doctor or health care professional for advice if you get a fever, chills or sore throat, or other symptoms of a cold or flu. Do not treat yourself. This drug decreases your body's ability to fight infections. Try to avoid being around people who are sick. This medicine may increase your risk to bruise or bleed. Call your doctor or health care professional if you notice any unusual bleeding. Be careful brushing and flossing your teeth or using a toothpick because you may get an infection or bleed more easily. If you have any dental work done, tell your dentist you are receiving this medicine. Avoid taking products that contain aspirin, acetaminophen, ibuprofen, naproxen, or ketoprofen unless instructed by your doctor. These medicines may hide a fever. Do not become pregnant while taking this medicine. Women should inform their doctor if they wish to become pregnant or think they might be pregnant. There is a potential for serious side effects to an unborn child. Talk to your health care professional or pharmacist for more information. Do not breast-feed an infant while taking this medicine. Men are advised not to father a child while receiving this medicine. This product may contain alcohol. Ask your pharmacist or healthcare provider if this medicine contains alcohol. Be sure to tell all healthcare providers you are taking this medicine. Certain  medicines, like metronidazole and disulfiram, can cause an unpleasant reaction when taken with alcohol. The reaction includes flushing, headache, nausea, vomiting, sweating, and increased thirst. The reaction can last from 30 minutes to several hours. What side effects may I notice from receiving this medicine? Side effects that you should report to your doctor or health care professional as soon as possible:  allergic reactions like skin rash, itching or hives, swelling of the face, lips, or tongue  breathing problems  changes in vision  fast, irregular heartbeat  high or low blood pressure  mouth sores  pain, tingling, numbness in the hands or feet  signs of decreased platelets or bleeding - bruising, pinpoint red spots on the skin, black, tarry stools, blood in the urine  signs of decreased red blood cells - unusually weak or tired, feeling faint or lightheaded, falls  signs of infection - fever or chills, cough, sore throat, pain or difficulty passing urine  signs and symptoms of liver injury like dark yellow or brown urine; general ill feeling or flu-like symptoms; light-colored stools; loss of appetite; nausea; right upper belly pain; unusually weak or tired; yellowing of the eyes or skin  swelling of the ankles, feet, hands  unusually slow heartbeat Side effects that usually do not require medical attention (report to your doctor or health care professional if they continue or are bothersome):  diarrhea  hair loss  loss of appetite  muscle or joint pain  nausea, vomiting  pain, redness, or irritation at site where injected  tiredness This list may not describe all possible side effects. Call your doctor for medical advice about side effects. You may report side effects to FDA at 1-800-FDA-1088. Where should I keep my medicine? This drug is given in a hospital or clinic and will not be stored at home. NOTE: This sheet is a summary. It may not cover all possible  information. If you have questions about this medicine, talk to your doctor, pharmacist, or health care provider.  2020 Elsevier/Gold Standard (2016-10-25 13:14:55) Carboplatin injection What is this medicine? CARBOPLATIN (KAR boe pla tin) is a chemotherapy drug. It targets fast dividing cells, like cancer cells, and causes these cells to die. This medicine is used to treat ovarian cancer and many other cancers. This medicine may be used for other purposes; ask your health care provider or pharmacist if you have questions. COMMON BRAND NAME(S): Paraplatin What should I tell my health care provider before I take this medicine? They need to know if you have any of these conditions:  blood disorders  hearing problems  kidney disease  recent or ongoing radiation therapy  an unusual or allergic reaction to carboplatin, cisplatin, other chemotherapy, other medicines, foods, dyes, or preservatives  pregnant or trying to get pregnant  breast-feeding How should I use this medicine? This drug is usually given as an infusion into a vein. It is administered in a hospital or clinic by a specially trained health care professional. Talk to your pediatrician regarding the use of this medicine in children. Special care may be needed. Overdosage: If you think you have taken too much of this medicine contact a poison control center or emergency room at once. NOTE: This medicine is only for you. Do not share this medicine with others. What if I miss a dose? It is important not to miss a dose. Call your doctor or health care professional if you are unable to keep an appointment. What may interact with this medicine?  medicines for seizures  medicines to increase blood counts like filgrastim, pegfilgrastim, sargramostim  some antibiotics like amikacin, gentamicin, neomycin, streptomycin, tobramycin  vaccines Talk to your doctor or health care professional before taking any of these  medicines:  acetaminophen  aspirin  ibuprofen  ketoprofen  naproxen This list may not describe all possible interactions. Give your health care provider a list of all the medicines, herbs, non-prescription drugs, or dietary supplements you use. Also tell them if you smoke, drink alcohol, or use illegal drugs. Some items may interact with your medicine. What should I watch for while using this medicine? Your condition will be monitored carefully while you are receiving this medicine. You will need important blood work done while you are taking this medicine. This drug may make you feel generally unwell. This is not uncommon, as chemotherapy can affect healthy cells as well as cancer cells. Report any side effects. Continue your course of treatment even though you feel ill unless your doctor tells you to stop. In some cases, you may be given additional medicines to help with side effects. Follow all directions for their use. Call your doctor or health care professional for advice if you get a fever, chills or sore throat, or other symptoms of a cold or flu. Do not treat yourself. This drug decreases your body's ability to fight infections. Try to avoid being around people who are sick. This medicine may increase your risk  to bruise or bleed. Call your doctor or health care professional if you notice any unusual bleeding. Be careful brushing and flossing your teeth or using a toothpick because you may get an infection or bleed more easily. If you have any dental work done, tell your dentist you are receiving this medicine. Avoid taking products that contain aspirin, acetaminophen, ibuprofen, naproxen, or ketoprofen unless instructed by your doctor. These medicines may hide a fever. Do not become pregnant while taking this medicine. Women should inform their doctor if they wish to become pregnant or think they might be pregnant. There is a potential for serious side effects to an unborn child. Talk  to your health care professional or pharmacist for more information. Do not breast-feed an infant while taking this medicine. What side effects may I notice from receiving this medicine? Side effects that you should report to your doctor or health care professional as soon as possible:  allergic reactions like skin rash, itching or hives, swelling of the face, lips, or tongue  signs of infection - fever or chills, cough, sore throat, pain or difficulty passing urine  signs of decreased platelets or bleeding - bruising, pinpoint red spots on the skin, black, tarry stools, nosebleeds  signs of decreased red blood cells - unusually weak or tired, fainting spells, lightheadedness  breathing problems  changes in hearing  changes in vision  chest pain  high blood pressure  low blood counts - This drug may decrease the number of white blood cells, red blood cells and platelets. You may be at increased risk for infections and bleeding.  nausea and vomiting  pain, swelling, redness or irritation at the injection site  pain, tingling, numbness in the hands or feet  problems with balance, talking, walking  trouble passing urine or change in the amount of urine Side effects that usually do not require medical attention (report to your doctor or health care professional if they continue or are bothersome):  hair loss  loss of appetite  metallic taste in the mouth or changes in taste This list may not describe all possible side effects. Call your doctor for medical advice about side effects. You may report side effects to FDA at 1-800-FDA-1088. Where should I keep my medicine? This drug is given in a hospital or clinic and will not be stored at home. NOTE: This sheet is a summary. It may not cover all possible information. If you have questions about this medicine, talk to your doctor, pharmacist, or health care provider.  2020 Elsevier/Gold Standard (2007-05-29 14:38:05)

## 2019-10-01 ENCOUNTER — Encounter: Payer: Self-pay | Admitting: Internal Medicine

## 2019-10-01 ENCOUNTER — Ambulatory Visit
Admission: RE | Admit: 2019-10-01 | Discharge: 2019-10-01 | Disposition: A | Discharge status: Home / Self Care | Payer: Medicare Other | Source: Ambulatory Visit | Attending: Radiation Oncology | Admitting: Radiation Oncology

## 2019-10-01 ENCOUNTER — Inpatient Hospital Stay: Payer: Medicare Other

## 2019-10-01 ENCOUNTER — Telehealth: Payer: Self-pay | Admitting: *Deleted

## 2019-10-01 ENCOUNTER — Other Ambulatory Visit: Payer: Self-pay

## 2019-10-01 DIAGNOSIS — C3412 Malignant neoplasm of upper lobe, left bronchus or lung: Secondary | ICD-10-CM | POA: Diagnosis not present

## 2019-10-01 DIAGNOSIS — Z510 Encounter for antineoplastic radiation therapy: Secondary | ICD-10-CM | POA: Diagnosis not present

## 2019-10-01 NOTE — Progress Notes (Signed)
Called pt to introduce myself as his Arboriculturist.  Pt has 2 insurances so copay assistance shouldn't be needed.  I left a msg requesting he return my call if he's interested in applying for the [employer].

## 2019-10-02 ENCOUNTER — Ambulatory Visit
Admission: RE | Admit: 2019-10-02 | Discharge: 2019-10-02 | Disposition: A | Discharge status: Home / Self Care | Payer: Medicare Other | Source: Ambulatory Visit | Attending: Radiation Oncology | Admitting: Radiation Oncology

## 2019-10-02 ENCOUNTER — Other Ambulatory Visit: Payer: Self-pay

## 2019-10-02 DIAGNOSIS — C3412 Malignant neoplasm of upper lobe, left bronchus or lung: Secondary | ICD-10-CM | POA: Diagnosis not present

## 2019-10-02 DIAGNOSIS — Z510 Encounter for antineoplastic radiation therapy: Secondary | ICD-10-CM | POA: Diagnosis not present

## 2019-10-03 ENCOUNTER — Ambulatory Visit
Admission: RE | Admit: 2019-10-03 | Discharge: 2019-10-03 | Disposition: A | Discharge status: Home / Self Care | Payer: Medicare Other | Source: Ambulatory Visit | Attending: Radiation Oncology | Admitting: Radiation Oncology

## 2019-10-03 ENCOUNTER — Encounter: Payer: Self-pay | Admitting: General Practice

## 2019-10-03 ENCOUNTER — Other Ambulatory Visit: Payer: Self-pay

## 2019-10-03 DIAGNOSIS — C3412 Malignant neoplasm of upper lobe, left bronchus or lung: Secondary | ICD-10-CM | POA: Diagnosis not present

## 2019-10-03 DIAGNOSIS — Z510 Encounter for antineoplastic radiation therapy: Secondary | ICD-10-CM | POA: Diagnosis not present
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
## 2019-10-04 ENCOUNTER — Ambulatory Visit
Admission: RE | Admit: 2019-10-04 | Discharge: 2019-10-04 | Disposition: A | Discharge status: Home / Self Care | Payer: Medicare Other | Source: Ambulatory Visit | Attending: Radiation Oncology | Admitting: Radiation Oncology

## 2019-10-04 DIAGNOSIS — Z510 Encounter for antineoplastic radiation therapy: Secondary | ICD-10-CM | POA: Diagnosis not present

## 2019-10-04 DIAGNOSIS — C3412 Malignant neoplasm of upper lobe, left bronchus or lung: Secondary | ICD-10-CM | POA: Diagnosis not present

## 2019-10-04 DIAGNOSIS — M5137 Other intervertebral disc degeneration, lumbosacral region: Secondary | ICD-10-CM | POA: Diagnosis not present

## 2019-10-04 DIAGNOSIS — J449 Chronic obstructive pulmonary disease, unspecified: Secondary | ICD-10-CM | POA: Diagnosis not present

## 2019-10-04 DIAGNOSIS — Z720 Tobacco use: Secondary | ICD-10-CM | POA: Diagnosis not present

## 2019-10-04 DIAGNOSIS — I10 Essential (primary) hypertension: Secondary | ICD-10-CM | POA: Diagnosis not present

## 2019-10-04 DIAGNOSIS — E7849 Other hyperlipidemia: Secondary | ICD-10-CM | POA: Diagnosis not present

## 2019-10-07 ENCOUNTER — Inpatient Hospital Stay (HOSPITAL_BASED_OUTPATIENT_CLINIC_OR_DEPARTMENT_OTHER): Payer: Medicare Other | Admitting: Physician Assistant

## 2019-10-07 ENCOUNTER — Inpatient Hospital Stay: Payer: Medicare Other

## 2019-10-07 ENCOUNTER — Ambulatory Visit: Payer: Medicare Other

## 2019-10-07 ENCOUNTER — Ambulatory Visit
Admission: RE | Admit: 2019-10-07 | Discharge: 2019-10-07 | Disposition: A | Discharge status: Home / Self Care | Payer: Medicare Other | Source: Ambulatory Visit | Attending: Radiation Oncology | Admitting: Radiation Oncology

## 2019-10-07 ENCOUNTER — Other Ambulatory Visit: Payer: Self-pay

## 2019-10-07 ENCOUNTER — Inpatient Hospital Stay: Payer: Medicare Other | Attending: Physician Assistant

## 2019-10-07 ENCOUNTER — Inpatient Hospital Stay: Payer: Medicare Other | Admitting: Physician Assistant

## 2019-10-07 VITALS — BP 158/75 | HR 78 | Temp 97.7°F | Resp 18

## 2019-10-07 DIAGNOSIS — Z5111 Encounter for antineoplastic chemotherapy: Secondary | ICD-10-CM | POA: Diagnosis not present

## 2019-10-07 DIAGNOSIS — Z510 Encounter for antineoplastic radiation therapy: Secondary | ICD-10-CM | POA: Diagnosis not present

## 2019-10-07 DIAGNOSIS — C3412 Malignant neoplasm of upper lobe, left bronchus or lung: Secondary | ICD-10-CM | POA: Insufficient documentation

## 2019-10-07 DIAGNOSIS — C3492 Malignant neoplasm of unspecified part of left bronchus or lung: Secondary | ICD-10-CM

## 2019-10-07 LAB — CBC WITH DIFFERENTIAL (CANCER CENTER ONLY)
Abs Immature Granulocytes: 0.05 10*3/uL (ref 0.00–0.07)
Basophils Absolute: 0 10*3/uL (ref 0.0–0.1)
Basophils Relative: 1 %
Eosinophils Absolute: 0.2 10*3/uL (ref 0.0–0.5)
Eosinophils Relative: 3 %
HCT: 41.9 % (ref 39.0–52.0)
Hemoglobin: 13.7 g/dL (ref 13.0–17.0)
Immature Granulocytes: 1 %
Lymphocytes Relative: 22 %
Lymphs Abs: 1.5 10*3/uL (ref 0.7–4.0)
MCH: 28.6 pg (ref 26.0–34.0)
MCHC: 32.7 g/dL (ref 30.0–36.0)
MCV: 87.5 fL (ref 80.0–100.0)
Monocytes Absolute: 0.5 10*3/uL (ref 0.1–1.0)
Monocytes Relative: 8 %
Neutro Abs: 4.5 10*3/uL (ref 1.7–7.7)
Neutrophils Relative %: 65 %
Platelet Count: 228 10*3/uL (ref 150–400)
RBC: 4.79 MIL/uL (ref 4.22–5.81)
RDW: 15.5 % (ref 11.5–15.5)
WBC Count: 6.8 10*3/uL (ref 4.0–10.5)
nRBC: 0 % (ref 0.0–0.2)

## 2019-10-07 LAB — CMP (CANCER CENTER ONLY)
ALT: 14 U/L (ref 0–44)
AST: 14 U/L — ABNORMAL LOW (ref 15–41)
Albumin: 3.1 g/dL — ABNORMAL LOW (ref 3.5–5.0)
Alkaline Phosphatase: 99 U/L (ref 38–126)
Anion gap: 7 (ref 5–15)
BUN: 15 mg/dL (ref 8–23)
CO2: 22 mmol/L (ref 22–32)
Calcium: 9.2 mg/dL (ref 8.9–10.3)
Chloride: 109 mmol/L (ref 98–111)
Creatinine: 1.09 mg/dL (ref 0.61–1.24)
GFR, Est AFR Am: >60 mL/min (ref >60)
GFR, Estimated: >60 mL/min (ref >60)
Glucose, Bld: 117 mg/dL — ABNORMAL HIGH (ref 70–99)
Potassium: 4.4 mmol/L (ref 3.5–5.1)
Sodium: 138 mmol/L (ref 135–145)
Total Bilirubin: 0.5 mg/dL (ref 0.3–1.2)
Total Protein: 6.6 g/dL (ref 6.5–8.1)

## 2019-10-07 MED ORDER — DIPHENHYDRAMINE HCL 50 MG/ML IJ SOLN
50.0000 mg | Freq: Once | INTRAMUSCULAR | Status: AC
  Administered 2019-10-07: 50 mg via INTRAVENOUS

## 2019-10-07 MED ORDER — SODIUM CHLORIDE 0.9 % IV SOLN
45.0000 mg/m2 | Freq: Once | INTRAVENOUS | Status: AC
  Administered 2019-10-07: 120 mg via INTRAVENOUS
  Filled 2019-10-07: qty 20

## 2019-10-07 MED ORDER — DIPHENHYDRAMINE HCL 50 MG/ML IJ SOLN
INTRAMUSCULAR | Status: AC
  Filled 2019-10-07: qty 1

## 2019-10-07 MED ORDER — SODIUM CHLORIDE 0.9 % IV SOLN
Freq: Once | INTRAVENOUS | Status: AC
  Filled 2019-10-07: qty 250

## 2019-10-07 MED ORDER — PALONOSETRON HCL INJECTION 0.25 MG/5ML
0.2500 mg | Freq: Once | INTRAVENOUS | Status: AC
  Administered 2019-10-07: 0.25 mg via INTRAVENOUS

## 2019-10-07 MED ORDER — SODIUM CHLORIDE 0.9 % IV SOLN
300.0000 mg | Freq: Once | INTRAVENOUS | Status: AC
  Administered 2019-10-07: 300 mg via INTRAVENOUS
  Filled 2019-10-07: qty 30

## 2019-10-07 MED ORDER — FAMOTIDINE IN NACL 20-0.9 MG/50ML-% IV SOLN
20.0000 mg | Freq: Once | INTRAVENOUS | Status: AC
  Administered 2019-10-07: 20 mg via INTRAVENOUS

## 2019-10-07 MED ORDER — FAMOTIDINE IN NACL 20-0.9 MG/50ML-% IV SOLN
INTRAVENOUS | Status: AC
  Filled 2019-10-07: qty 50

## 2019-10-07 MED ORDER — PALONOSETRON HCL INJECTION 0.25 MG/5ML
INTRAVENOUS | Status: AC
  Filled 2019-10-07: qty 5

## 2019-10-07 MED ORDER — SODIUM CHLORIDE 0.9 % IV SOLN
20.0000 mg | Freq: Once | INTRAVENOUS | Status: AC
  Administered 2019-10-07: 20 mg via INTRAVENOUS
  Filled 2019-10-07: qty 20

## 2019-10-07 NOTE — Progress Notes (Signed)
[facility] OFFICE PROGRESS NOTE  [name], MD [address] 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  Diagnosed in 2019. 2) stage Ia3 (T1cN0M0) non-small cell lung cancer.  Diagnosed with a left suprahilar region with AP window adenopathy.  Status post SBRT 3) renal cell carcinoma status post CT-guided ablation in July 2020.  Followed by Dr. [name]. 4) recurrent non-small cell lung cancer in July 2021.  Patient presented with new adenopathy in the AP window. Inaccessible to biopsy  PRIOR THERAPY: 1) SBRT to the LUL nodule in 2019 under the care of Dr. Lisbeth Renshaw 2) SBRT to the left suprahilar mass under the care of Dr. Lisbeth Renshaw in March 2020 3) CT guided ablation to the renal cell carcinoma under the care of Dr. Kathlene Cote.  CURRENT THERAPY: Concurrent chemoradiation with carboplatin for an AUC of 2 and paclitaxel 45 mg/m.  First dose on 09/30/2019.  Status post 1 cycle.  INTERVAL HISTORY: Reginald Berry 69 y.o. male returns to the clinic today for a follow-up visit.  The patient is feeling fairly well today without any concerning complaints. The patient is currently undergoing concurrent chemoradiation.  He had his first dose of treatment last week and he tolerated it well without any concerning adverse side effects.  The patient's last day of radiation is scheduled for 11/15/19.  The patient is having a lot of challenges related to his living situation. He is in touch with the social worker team who has been helping him with his constraints.  Today, he is feeling well.  He denies any recent fevers, chills, night sweats, or weight loss. He does endorse insomnia and is requesting xanax. This was prescribed in the past by his PCP. This was discontinued due to the patient also being prescribed percocet for chronic back pain. Denies any nausea, vomiting, or diarrhea. He has frequent and  chronic constipation and takes laxatives as needed. Denies any chest pain or hemoptysis. He states he had an episode of chest pain with washing dishes last week which subsided with rest. He denies chest pain at this time. He follows with cardiology. He reports his baseline shortness of breath with exertion and a chronic cough since undergoing treatment for his lung cancer in 2019.  He denies headaches or visual changes. He is here today for evaluation before starting cycle #2.   MEDICAL HISTORY: Past Medical History:  Diagnosis Date  . Aortic atherosclerosis ([place])   . Arthritis   . Chronic low back pain   . COPD (chronic obstructive pulmonary disease) ([facility])   . Coronary artery calcification seen on CT scan    Multivessel  . Essential hypertension   . Gait instability    Previous logging accident  . GERD (gastroesophageal reflux disease)   . Lung cancer ([place])    Poorly differentiated, XRT  . Renal cell carcinoma ([facility])    Left, status post cryoablation August 2020    ALLERGIES:  has No Known Allergies.  MEDICATIONS:  Current Outpatient Medications  Medication Sig Dispense Refill  . amLODipine (NORVASC) 10 MG tablet Take 10 mg by mouth daily.    Marland Kitchen aspirin 81 MG EC tablet Take 81 mg by mouth daily. Swallow whole.    Marland Kitchen buPROPion (WELLBUTRIN XL) 300 MG 24 hr tablet Take 300 mg by mouth daily.    . busPIRone (BUSPAR) 15 MG tablet Take 15 mg by mouth daily.    Marland Kitchen  cetirizine (ZYRTEC) 10 MG tablet Take 10 mg by mouth daily as needed for allergies.    . furosemide (LASIX) 40 MG tablet Take 40 mg by mouth daily.     . hydrochlorothiazide (MICROZIDE) 12.5 MG capsule Take 12.5 mg by mouth daily.    Marland Kitchen liothyronine (CYTOMEL) 25 MCG tablet Take 25 mcg by mouth daily.    Marland Kitchen losartan (COZAAR) 100 MG tablet Take 100 mg by mouth daily.    . Multiple Vitamins-Minerals (CENTRUM SILVER 50+MEN) TABS Take 1 tablet by mouth daily.    Marland Kitchen oxyCODONE-acetaminophen (PERCOCET) 10-325 MG tablet Take 1 tablet by mouth  every 4 (four) hours as needed for pain.    . pantoprazole (PROTONIX) 40 MG tablet Take 40 mg by mouth every evening.     . Probiotic Product (PROBIOTIC DAILY PO) Take 1 capsule by mouth daily.    . prochlorperazine (COMPAZINE) 10 MG tablet Take 1 tablet (10 mg total) by mouth every 6 (six) hours as needed. 30 tablet 2  . rosuvastatin (CRESTOR) 5 MG tablet Take 5 mg by mouth at bedtime.    . sildenafil (VIAGRA) 100 MG tablet Take 100 mg by mouth daily as needed for erectile dysfunction.     No current facility-administered medications for this visit.    SURGICAL HISTORY:  Past Surgical History:  Procedure Laterality Date  . CHOLECYSTECTOMY     2019  . FRACTURE SURGERY     bil legs (logging trees)  . IR RADIOLOGIST EVAL & MGMT  09/27/2018  . IR RADIOLOGIST EVAL & MGMT  11/22/2018  . IR RADIOLOGIST EVAL & MGMT  04/10/2019  . NECK SURGERY  1994-1995  . RADIOLOGY WITH ANESTHESIA Left 10/31/2018   Procedure: CT WITH ANESTHESIA RENAL CRYO ABLATION;  Surgeon: Aletta Edouard, MD;  Location: [facility];  Service: Radiology;  Laterality: Left;    REVIEW OF SYSTEMS:   Review of Systems  Constitutional: Positive for fatigue and generalized weakness.  Negative for appetite change, chills, fever and unexpected weight change.  HENT: Negative for mouth sores, nosebleeds, sore throat and trouble swallowing.   Eyes: Negative for eye problems and icterus.  Respiratory: Positive for chronic cough and shortness of breath.  Negative for hemoptysis and wheezing.   Cardiovascular: Positive for bilateral lower extremity swelling. Negative for chest pain. Gastrointestinal: Positive for chronic and frequent constipation. Negative for abdominal pain, diarrhea, nausea and vomiting.  Genitourinary: Negative for bladder incontinence, difficulty urinating, dysuria, frequency and hematuria.   Musculoskeletal: Positive for chronic neck and back pain secondary to an old injury.   Skin: Positive for multiple scabs on his  extremities. Neurological: Negative for dizziness, extremity weakness, gait problem, headaches, light-headedness and seizures.  Hematological: Negative for adenopathy. Does not bruise/bleed easily.  Psychiatric/Behavioral: Negative for confusion, depression and sleep disturbance. The patient is not nervous/anxious.       PHYSICAL EXAMINATION:  There were no vitals taken for this visit.  ECOG PERFORMANCE STATUS: 2 - Symptomatic, <50% confined to bed  Physical Exam  Constitutional: Oriented to person, place, and time and appears older than stated age.  No acute distress. HENT:  Head: Normocephalic and atraumatic.  Mouth/Throat: Oropharynx is clear and moist. No oropharyngeal exudate.  Eyes: Conjunctivae are normal. Right eye exhibits no discharge. Left eye exhibits no discharge. No scleral icterus.  Neck: Normal range of motion. Neck supple.  Cardiovascular: Normal rate, regular rhythm, normal heart sounds and intact distal pulses.   Pulmonary/Chest: Effort normal. Some wheezing No respiratory distress. No rales.  Abdominal: Soft. Bowel sounds are normal. Exhibits no distension and no mass. There is no tenderness.  Musculoskeletal: Bilateral lower extremity edema. normal range of motion.   Lymphadenopathy:    No cervical adenopathy.  Neurological: Alert and oriented to person, place, and time.  Exhibits muscle wasting.  Skin: Skin is warm and dry.  Chronic venous insufficiency in lower extremities.  Several scabs on the patient's extremities.  Not diaphoretic. No pallor.  Psychiatric: Mood, memory and judgment normal.  Vitals reviewed.  LABORATORY DATA: Lab Results  Component Value Date   WBC 6.8 10/07/2019   HGB 13.7 10/07/2019   HCT 41.9 10/07/2019   MCV 87.5 10/07/2019   PLT 228 10/07/2019      Chemistry      Component Value Date/Time   NA 140 09/30/2019 0758   K 4.6 09/30/2019 0758   CL 110 09/30/2019 0758   CO2 22 09/30/2019 0758   BUN 15 09/30/2019 0758    CREATININE 1.11 09/30/2019 0758      Component Value Date/Time   CALCIUM 9.5 09/30/2019 0758   ALKPHOS 102 09/30/2019 0758   AST 15 09/30/2019 0758   ALT 15 09/30/2019 0758   BILITOT 0.5 09/30/2019 0758       RADIOGRAPHIC STUDIES:  CT CHEST W CONTRAST  Result Date: 09/11/2019 CLINICAL DATA:  Non-small cell lung cancer. Restaging LEFT upper lobe stage I cancer. Status SBRT EXAM: CT CHEST WITH CONTRAST TECHNIQUE: Multidetector CT imaging of the chest was performed during intravenous contrast administration. CONTRAST:  48mL OMNIPAQUE IOHEXOL 300 MG/ML  SOLN COMPARISON:  None. FINDINGS: Cardiovascular: Coronary artery calcification and aortic atherosclerotic calcification. Mediastinum/Nodes: No axillary or supraclavicular adenopathy. Prevascular lymph node measuring 14 mm (image 52/series 2) is increased from 4 mm on CT 02/19/2019. Lungs/Pleura: Band peribronchial thickening in the LEFT upper lobe is similar comparison exam. Central nodular portion measures 2.5 by 1.5 cm unchanged. There is some new peribronchial consolidation anteriorly on image 53/2. LEFT lower lobe is clear. RIGHT lung is clear. Upper Abdomen: Thickening of the adrenal glands similar prior. No upper abdominal adenopathy. Musculoskeletal: No aggressive osseous lesion. IMPRESSION: 1. Newly enlarged prevascular mediastinal lymph node is concerning for new LYMPH NODE METASTASIS. 2. Mild increase in peribronchial thickening and consolidation in the LEFT upper lobe is most likely post radiation change. These results will be called to the ordering clinician or representative by the Radiologist Assistant, and communication documented in the PACS or [employer]. Electronically Signed   By: Suzy Bouchard M.D.   On: 09/11/2019 10:49   NM PET Image Restag (PS) Skull Base To Thigh  Result Date: 09/17/2019 CLINICAL DATA:  Subsequent treatment strategy for non-small-cell lung cancer with developing adenopathy. radiation therapy in 2019.  EXAM: NUCLEAR MEDICINE PET SKULL BASE TO THIGH TECHNIQUE: 17.0 mCi F-18 FDG was injected intravenously. Full-ring PET imaging was performed from the skull base to thigh after the radiotracer. CT data was obtained and used for attenuation correction and anatomic localization. Fasting blood glucose: 111 mg/dl COMPARISON:  Chest CT 09/11/2019.  Most recent PET of 05/21/2018 FINDINGS: Mediastinal blood pool activity: SUV max 3.3 Liver activity: SUV max NA NECK: The previously described hypermetabolic posterior left neck lesion is improved to resolved. There is residual skin thickening and low-level hypermetabolism in this region, including at a S.U.V. max of 3.9 on 19/4. Bilateral hypermetabolic parotid nodules are again identified. Example on the left at 5 mm and a S.U.V. max of 9.1 today versus a S.U.V. max of 6.6 on the  prior exam. Incidental CT findings: Bilateral carotid atherosclerosis. No cervical adenopathy. CHEST: Hypermetabolism corresponding to the prevascular node of concern. 1.4 cm and a S.U.V. max of 36.1 on 61/4. Similar in size to 09/10/2019 CT. Left upper lobe patchy hypermetabolism is likely radiation induced. Right axillary skin thickening and hypermetabolism are new. Example at 6 mm and a S.U.V. max of 5.4 on 62/4. Incidental CT findings: Deferred to recent diagnostic CT. Aortic and coronary artery atherosclerosis. Pulmonary artery enlargement, outflow tract 3.4 cm. ABDOMEN/PELVIS: Low-level hypermetabolism within prominent but not pathologically sized left inguinal nodes, likely reactive. No suspicious abdominopelvic nodal or parenchymal hypermetabolism identified. Incidental CT findings: Cholecystectomy. Left greater than right adrenal gland nodularity, similar. Low-density bilateral renal lesions are likely cysts. Abdominal aortic atherosclerosis. Small fat containing right inguinal hernia. SKELETON: No abnormal marrow activity. Incidental CT findings: none IMPRESSION: 1. The newly enlarged  prevascular node is markedly hypermetabolic, consistent with nodal metastasis. 2. New right axillary skin thickening and hypermetabolism, nonspecific. Consider physical exam correlation. 3. No evidence of hypermetabolic extrathoracic metastasis. 4. Incidental findings, including: Coronary artery atherosclerosis. Aortic Atherosclerosis (ICD10-I70.0). Chronic small hypermetabolic parotid nodules, likely benign. Pulmonary artery enlargement suggests pulmonary arterial hypertension. Electronically Signed   By: Abigail Miyamoto M.D.   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  Diagnosed in 2019. 2) stage Ia3 (T1cN0M0) non-small cell lung cancer.  Diagnosed with a left suprahilar region with AP window adenopathy.  Status post SBRT 3) renal cell carcinoma status post CT-guided ablation in July 2020.  Followed by Dr. [name]. 4) recurrent non-small cell lung cancer in July 2021.  Patient presented with new adenopathy in the AP window. No accessible to biopsy.  The patient is currently undergoing concurrent chemoradiation with carboplatin for an AUC of 2 and paclitaxel 45 mg/m2. He is status post 1 cycle. His last day of radiation is scheduled for 11/15/19.   Labs were reviewed. Recommend that he proceed with cycle #2 today as scheduled.  I will see him back for a follow up visit in 2 weeks for evaluation before starting cycle #4.   Regarding his request for Xanax, I instructed the patient to use benadryl or tylenol PM if needed for insomnia. If he would like a refill of his xanax, I would encourage him to reach out to his PCP.   The patient was advised to call immediately if he has any concerning symptoms in the interval. The patient voices understanding of current disease status and treatment options and is in agreement with the current care  plan. All questions were answered. The patient knows to call the clinic with any problems, questions or concerns. We can certainly see the patient much sooner if necessary   No orders of the defined types were placed in this encounter.    Muhsin Doris L Rylan Bernard, PA-C 10/07/19

## 2019-10-07 NOTE — Patient Instructions (Signed)
[facility] Discharge Instructions for Patients Receiving Chemotherapy  Today you received the following chemotherapy agents: Pacliatxel and Carboplatin  To help prevent nausea and vomiting after your treatment, we encourage you to take your nausea medication as directed by your MD.   If you develop nausea and vomiting that is not controlled by your nausea medication, call the clinic.   BELOW ARE SYMPTOMS THAT SHOULD BE REPORTED IMMEDIATELY:  *FEVER GREATER THAN 100.5 F  *CHILLS WITH OR WITHOUT FEVER  NAUSEA AND VOMITING THAT IS NOT CONTROLLED WITH YOUR NAUSEA MEDICATION  *UNUSUAL SHORTNESS OF BREATH  *UNUSUAL BRUISING OR BLEEDING  TENDERNESS IN MOUTH AND THROAT WITH OR WITHOUT PRESENCE OF ULCERS  *URINARY PROBLEMS  *BOWEL PROBLEMS  UNUSUAL RASH Items with * indicate a potential emergency and should be followed up as soon as possible.  Feel free to call the clinic should you have any questions or concerns. The clinic phone number is [phone].  Please show the [facility] at check-in to the Emergency Department and triage nurse.  Paclitaxel injection What is this medicine? PACLITAXEL (PAK li TAX el) is a chemotherapy drug. It targets fast dividing cells, like cancer cells, and causes these cells to die. This medicine is used to treat ovarian cancer, breast cancer, lung cancer, Kaposi's sarcoma, and other cancers. This medicine may be used for other purposes; ask your health care provider or pharmacist if you have questions. COMMON BRAND NAME(S): Onxol, Taxol What should I tell my health care provider before I take this medicine? They need to know if you have any of these conditions:  history of irregular heartbeat  liver disease  low blood counts, like low white cell, platelet, or red cell counts  lung or breathing disease, like asthma  tingling of the fingers or toes, or other nerve disorder  an unusual or allergic reaction to paclitaxel,  alcohol, polyoxyethylated castor oil, other chemotherapy, other medicines, foods, dyes, or preservatives  pregnant or trying to get pregnant  breast-feeding How should I use this medicine? This drug is given as an infusion into a vein. It is administered in a hospital or clinic by a specially trained health care professional. Talk to your pediatrician regarding the use of this medicine in children. Special care may be needed. Overdosage: If you think you have taken too much of this medicine contact a poison control center or emergency room at once. NOTE: This medicine is only for you. Do not share this medicine with others. What if I miss a dose? It is important not to miss your dose. Call your doctor or health care professional if you are unable to keep an appointment. What may interact with this medicine? Do not take this medicine with any of the following medications:  disulfiram  metronidazole This medicine may also interact with the following medications:  antiviral medicines for hepatitis, HIV or AIDS  certain antibiotics like erythromycin and clarithromycin  certain medicines for fungal infections like ketoconazole and itraconazole  certain medicines for seizures like carbamazepine, phenobarbital, phenytoin  gemfibrozil  nefazodone  rifampin  St. John's wort This list may not describe all possible interactions. Give your health care provider a list of all the medicines, herbs, non-prescription drugs, or dietary supplements you use. Also tell them if you smoke, drink alcohol, or use illegal drugs. Some items may interact with your medicine. What should I watch for while using this medicine? Your condition will be monitored carefully while you are receiving this medicine. You  will need important blood work done while you are taking this medicine. This medicine can cause serious allergic reactions. To reduce your risk you will need to take other medicine(s) before treatment  with this medicine. If you experience allergic reactions like skin rash, itching or hives, swelling of the face, lips, or tongue, tell your doctor or health care professional right away. In some cases, you may be given additional medicines to help with side effects. Follow all directions for their use. This drug may make you feel generally unwell. This is not uncommon, as chemotherapy can affect healthy cells as well as cancer cells. Report any side effects. Continue your course of treatment even though you feel ill unless your doctor tells you to stop. Call your doctor or health care professional for advice if you get a fever, chills or sore throat, or other symptoms of a cold or flu. Do not treat yourself. This drug decreases your body's ability to fight infections. Try to avoid being around people who are sick. This medicine may increase your risk to bruise or bleed. Call your doctor or health care professional if you notice any unusual bleeding. Be careful brushing and flossing your teeth or using a toothpick because you may get an infection or bleed more easily. If you have any dental work done, tell your dentist you are receiving this medicine. Avoid taking products that contain aspirin, acetaminophen, ibuprofen, naproxen, or ketoprofen unless instructed by your doctor. These medicines may hide a fever. Do not become pregnant while taking this medicine. Women should inform their doctor if they wish to become pregnant or think they might be pregnant. There is a potential for serious side effects to an unborn child. Talk to your health care professional or pharmacist for more information. Do not breast-feed an infant while taking this medicine. Men are advised not to father a child while receiving this medicine. This product may contain alcohol. Ask your pharmacist or healthcare provider if this medicine contains alcohol. Be sure to tell all healthcare providers you are taking this medicine. Certain  medicines, like metronidazole and disulfiram, can cause an unpleasant reaction when taken with alcohol. The reaction includes flushing, headache, nausea, vomiting, sweating, and increased thirst. The reaction can last from 30 minutes to several hours. What side effects may I notice from receiving this medicine? Side effects that you should report to your doctor or health care professional as soon as possible:  allergic reactions like skin rash, itching or hives, swelling of the face, lips, or tongue  breathing problems  changes in vision  fast, irregular heartbeat  high or low blood pressure  mouth sores  pain, tingling, numbness in the hands or feet  signs of decreased platelets or bleeding - bruising, pinpoint red spots on the skin, black, tarry stools, blood in the urine  signs of decreased red blood cells - unusually weak or tired, feeling faint or lightheaded, falls  signs of infection - fever or chills, cough, sore throat, pain or difficulty passing urine  signs and symptoms of liver injury like dark yellow or brown urine; general ill feeling or flu-like symptoms; light-colored stools; loss of appetite; nausea; right upper belly pain; unusually weak or tired; yellowing of the eyes or skin  swelling of the ankles, feet, hands  unusually slow heartbeat Side effects that usually do not require medical attention (report to your doctor or health care professional if they continue or are bothersome):  diarrhea  hair loss  loss of appetite  muscle or joint pain  nausea, vomiting  pain, redness, or irritation at site where injected  tiredness This list may not describe all possible side effects. Call your doctor for medical advice about side effects. You may report side effects to FDA at 1-800-FDA-1088. Where should I keep my medicine? This drug is given in a hospital or clinic and will not be stored at home. NOTE: This sheet is a summary. It may not cover all possible  information. If you have questions about this medicine, talk to your doctor, pharmacist, or health care provider.  2020 [employer]vier/Gold Standard (2016-10-25 13:14:55) Carboplatin injection What is this medicine? CARBOPLATIN (KAR boe pla tin) is a chemotherapy drug. It targets fast dividing cells, like cancer cells, and causes these cells to die. This medicine is used to treat ovarian cancer and many other cancers. This medicine may be used for other purposes; ask your health care provider or pharmacist if you have questions. COMMON BRAND NAME(S): Paraplatin What should I tell my health care provider before I take this medicine? They need to know if you have any of these conditions:  blood disorders  hearing problems  kidney disease  recent or ongoing radiation therapy  an unusual or allergic reaction to carboplatin, cisplatin, other chemotherapy, other medicines, foods, dyes, or preservatives  pregnant or trying to get pregnant  breast-feeding How should I use this medicine? This drug is usually given as an infusion into a vein. It is administered in a hospital or clinic by a specially trained health care professional. Talk to your pediatrician regarding the use of this medicine in children. Special care may be needed. Overdosage: If you think you have taken too much of this medicine contact a poison control center or emergency room at once. NOTE: This medicine is only for you. Do not share this medicine with others. What if I miss a dose? It is important not to miss a dose. Call your doctor or health care professional if you are unable to keep an appointment. What may interact with this medicine?  medicines for seizures  medicines to increase blood counts like filgrastim, pegfilgrastim, sargramostim  some antibiotics like amikacin, gentamicin, neomycin, streptomycin, tobramycin  vaccines Talk to your doctor or health care professional before taking any of these  medicines:  acetaminophen  aspirin  ibuprofen  ketoprofen  naproxen This list may not describe all possible interactions. Give your health care provider a list of all the medicines, herbs, non-prescription drugs, or dietary supplements you use. Also tell them if you smoke, drink alcohol, or use illegal drugs. Some items may interact with your medicine. What should I watch for while using this medicine? Your condition will be monitored carefully while you are receiving this medicine. You will need important blood work done while you are taking this medicine. This drug may make you feel generally unwell. This is not uncommon, as chemotherapy can affect healthy cells as well as cancer cells. Report any side effects. Continue your course of treatment even though you feel ill unless your doctor tells you to stop. In some cases, you may be given additional medicines to help with side effects. Follow all directions for their use. Call your doctor or health care professional for advice if you get a fever, chills or sore throat, or other symptoms of a cold or flu. Do not treat yourself. This drug decreases your body's ability to fight infections. Try to avoid being around people who are sick. This medicine may increase your risk  to bruise or bleed. Call your doctor or health care professional if you notice any unusual bleeding. Be careful brushing and flossing your teeth or using a toothpick because you may get an infection or bleed more easily. If you have any dental work done, tell your dentist you are receiving this medicine. Avoid taking products that contain aspirin, acetaminophen, ibuprofen, naproxen, or ketoprofen unless instructed by your doctor. These medicines may hide a fever. Do not become pregnant while taking this medicine. Women should inform their doctor if they wish to become pregnant or think they might be pregnant. There is a potential for serious side effects to an unborn child. Talk  to your health care professional or pharmacist for more information. Do not breast-feed an infant while taking this medicine. What side effects may I notice from receiving this medicine? Side effects that you should report to your doctor or health care professional as soon as possible:  allergic reactions like skin rash, itching or hives, swelling of the face, lips, or tongue  signs of infection - fever or chills, cough, sore throat, pain or difficulty passing urine  signs of decreased platelets or bleeding - bruising, pinpoint red spots on the skin, black, tarry stools, nosebleeds  signs of decreased red blood cells - unusually weak or tired, fainting spells, lightheadedness  breathing problems  changes in hearing  changes in vision  chest pain  high blood pressure  low blood counts - This drug may decrease the number of white blood cells, red blood cells and platelets. You may be at increased risk for infections and bleeding.  nausea and vomiting  pain, swelling, redness or irritation at the injection site  pain, tingling, numbness in the hands or feet  problems with balance, talking, walking  trouble passing urine or change in the amount of urine Side effects that usually do not require medical attention (report to your doctor or health care professional if they continue or are bothersome):  hair loss  loss of appetite  metallic taste in the mouth or changes in taste This list may not describe all possible side effects. Call your doctor for medical advice about side effects. You may report side effects to FDA at 1-800-FDA-1088. Where should I keep my medicine? This drug is given in a hospital or clinic and will not be stored at home. NOTE: This sheet is a summary. It may not cover all possible information. If you have questions about this medicine, talk to your doctor, pharmacist, or health care provider.  2020 Elsevier/Gold Standard (2007-05-29 14:38:05)

## 2019-10-08 ENCOUNTER — Ambulatory Visit
Admission: RE | Admit: 2019-10-08 | Discharge: 2019-10-08 | Disposition: A | Discharge status: Home / Self Care | Payer: Medicare Other | Source: Ambulatory Visit | Attending: Radiation Oncology | Admitting: Radiation Oncology

## 2019-10-08 DIAGNOSIS — Z510 Encounter for antineoplastic radiation therapy: Secondary | ICD-10-CM | POA: Diagnosis not present

## 2019-10-08 DIAGNOSIS — C3412 Malignant neoplasm of upper lobe, left bronchus or lung: Secondary | ICD-10-CM | POA: Diagnosis not present

## 2019-10-09 ENCOUNTER — Ambulatory Visit
Admission: RE | Admit: 2019-10-09 | Discharge: 2019-10-09 | Disposition: A | Discharge status: Home / Self Care | Payer: Medicare Other | Source: Ambulatory Visit | Attending: Radiation Oncology | Admitting: Radiation Oncology

## 2019-10-09 DIAGNOSIS — C3412 Malignant neoplasm of upper lobe, left bronchus or lung: Secondary | ICD-10-CM | POA: Diagnosis not present

## 2019-10-09 DIAGNOSIS — Z510 Encounter for antineoplastic radiation therapy: Secondary | ICD-10-CM | POA: Diagnosis not present

## 2019-10-10 ENCOUNTER — Ambulatory Visit
Admission: RE | Admit: 2019-10-10 | Discharge: 2019-10-10 | Disposition: A | Discharge status: Home / Self Care | Payer: Medicare Other | Source: Ambulatory Visit | Attending: Radiation Oncology | Admitting: Radiation Oncology

## 2019-10-10 DIAGNOSIS — C3412 Malignant neoplasm of upper lobe, left bronchus or lung: Secondary | ICD-10-CM | POA: Diagnosis not present

## 2019-10-10 DIAGNOSIS — Z510 Encounter for antineoplastic radiation therapy: Secondary | ICD-10-CM | POA: Diagnosis not present

## 2019-10-11 ENCOUNTER — Ambulatory Visit
Admission: RE | Admit: 2019-10-11 | Discharge: 2019-10-11 | Disposition: A | Discharge status: Home / Self Care | Payer: Medicare Other | Source: Ambulatory Visit | Attending: Radiation Oncology | Admitting: Radiation Oncology

## 2019-10-11 DIAGNOSIS — Z510 Encounter for antineoplastic radiation therapy: Secondary | ICD-10-CM | POA: Diagnosis not present

## 2019-10-11 DIAGNOSIS — C3412 Malignant neoplasm of upper lobe, left bronchus or lung: Secondary | ICD-10-CM | POA: Diagnosis not present

## 2019-10-14 ENCOUNTER — Ambulatory Visit
Admission: RE | Admit: 2019-10-14 | Discharge: 2019-10-14 | Disposition: A | Discharge status: Home / Self Care | Payer: Medicare Other | Source: Ambulatory Visit | Attending: Radiation Oncology | Admitting: Radiation Oncology

## 2019-10-14 ENCOUNTER — Inpatient Hospital Stay: Payer: Medicare Other

## 2019-10-14 ENCOUNTER — Ambulatory Visit: Payer: Medicare Other

## 2019-10-14 ENCOUNTER — Other Ambulatory Visit: Payer: Self-pay

## 2019-10-14 ENCOUNTER — Other Ambulatory Visit: Payer: Medicare Other

## 2019-10-14 VITALS — BP 139/64 | HR 93 | Temp 98.6°F | Resp 18

## 2019-10-14 DIAGNOSIS — C3412 Malignant neoplasm of upper lobe, left bronchus or lung: Secondary | ICD-10-CM

## 2019-10-14 DIAGNOSIS — Z510 Encounter for antineoplastic radiation therapy: Secondary | ICD-10-CM | POA: Diagnosis not present

## 2019-10-14 DIAGNOSIS — Z5111 Encounter for antineoplastic chemotherapy: Secondary | ICD-10-CM | POA: Diagnosis not present

## 2019-10-14 DIAGNOSIS — C3492 Malignant neoplasm of unspecified part of left bronchus or lung: Secondary | ICD-10-CM

## 2019-10-14 LAB — CBC WITH DIFFERENTIAL (CANCER CENTER ONLY)
Abs Immature Granulocytes: 0.04 10*3/uL (ref 0.00–0.07)
Basophils Absolute: 0 10*3/uL (ref 0.0–0.1)
Basophils Relative: 1 %
Eosinophils Absolute: 0.1 10*3/uL (ref 0.0–0.5)
Eosinophils Relative: 1 %
HCT: 39.5 % (ref 39.0–52.0)
Hemoglobin: 13.2 g/dL (ref 13.0–17.0)
Immature Granulocytes: 1 %
Lymphocytes Relative: 19 %
Lymphs Abs: 1.1 10*3/uL (ref 0.7–4.0)
MCH: 28.4 pg (ref 26.0–34.0)
MCHC: 33.4 g/dL (ref 30.0–36.0)
MCV: 84.9 fL (ref 80.0–100.0)
Monocytes Absolute: 0.5 10*3/uL (ref 0.1–1.0)
Monocytes Relative: 9 %
Neutro Abs: 4 10*3/uL (ref 1.7–7.7)
Neutrophils Relative %: 69 %
Platelet Count: 243 10*3/uL (ref 150–400)
RBC: 4.65 MIL/uL (ref 4.22–5.81)
RDW: 16.4 % — ABNORMAL HIGH (ref 11.5–15.5)
WBC Count: 5.8 10*3/uL (ref 4.0–10.5)
nRBC: 0 % (ref 0.0–0.2)

## 2019-10-14 LAB — CMP (CANCER CENTER ONLY)
ALT: 15 U/L (ref 0–44)
AST: 13 U/L — ABNORMAL LOW (ref 15–41)
Albumin: 3 g/dL — ABNORMAL LOW (ref 3.5–5.0)
Alkaline Phosphatase: 83 U/L (ref 38–126)
Anion gap: 5 (ref 5–15)
BUN: 16 mg/dL (ref 8–23)
CO2: 22 mmol/L (ref 22–32)
Calcium: 9.1 mg/dL (ref 8.9–10.3)
Chloride: 108 mmol/L (ref 98–111)
Creatinine: 1.02 mg/dL (ref 0.61–1.24)
GFR, Est AFR Am: >60 mL/min (ref >60)
GFR, Estimated: >60 mL/min (ref >60)
Glucose, Bld: 118 mg/dL — ABNORMAL HIGH (ref 70–99)
Potassium: 4.2 mmol/L (ref 3.5–5.1)
Sodium: 135 mmol/L (ref 135–145)
Total Bilirubin: 0.3 mg/dL (ref 0.3–1.2)
Total Protein: 6.4 g/dL — ABNORMAL LOW (ref 6.5–8.1)

## 2019-10-14 MED ORDER — DIPHENHYDRAMINE HCL 50 MG/ML IJ SOLN
50.0000 mg | Freq: Once | INTRAMUSCULAR | Status: AC
  Administered 2019-10-14: 50 mg via INTRAVENOUS

## 2019-10-14 MED ORDER — PALONOSETRON HCL INJECTION 0.25 MG/5ML
INTRAVENOUS | Status: AC
  Filled 2019-10-14: qty 5

## 2019-10-14 MED ORDER — FAMOTIDINE IN NACL 20-0.9 MG/50ML-% IV SOLN
INTRAVENOUS | Status: AC
  Filled 2019-10-14: qty 50

## 2019-10-14 MED ORDER — PALONOSETRON HCL INJECTION 0.25 MG/5ML
0.2500 mg | Freq: Once | INTRAVENOUS | Status: AC
  Administered 2019-10-14: 0.25 mg via INTRAVENOUS

## 2019-10-14 MED ORDER — SODIUM CHLORIDE 0.9 % IV SOLN
Freq: Once | INTRAVENOUS | Status: AC
  Filled 2019-10-14: qty 250

## 2019-10-14 MED ORDER — DIPHENHYDRAMINE HCL 50 MG/ML IJ SOLN
INTRAMUSCULAR | Status: AC
  Filled 2019-10-14: qty 1

## 2019-10-14 MED ORDER — FAMOTIDINE IN NACL 20-0.9 MG/50ML-% IV SOLN
20.0000 mg | Freq: Once | INTRAVENOUS | Status: AC
  Administered 2019-10-14: 20 mg via INTRAVENOUS

## 2019-10-14 MED ORDER — SODIUM CHLORIDE 0.9 % IV SOLN
300.0000 mg | Freq: Once | INTRAVENOUS | Status: AC
  Administered 2019-10-14: 300 mg via INTRAVENOUS
  Filled 2019-10-14: qty 30

## 2019-10-14 MED ORDER — SODIUM CHLORIDE 0.9 % IV SOLN
45.0000 mg/m2 | Freq: Once | INTRAVENOUS | Status: AC
  Administered 2019-10-14: 120 mg via INTRAVENOUS
  Filled 2019-10-14: qty 20

## 2019-10-14 MED ORDER — SODIUM CHLORIDE 0.9 % IV SOLN
20.0000 mg | Freq: Once | INTRAVENOUS | Status: AC
  Administered 2019-10-14: 20 mg via INTRAVENOUS
  Filled 2019-10-14: qty 20

## 2019-10-14 NOTE — Patient Instructions (Signed)
[facility] Discharge Instructions for Patients Receiving Chemotherapy  Today you received the following chemotherapy agents: Pacliatxel and Carboplatin  To help prevent nausea and vomiting after your treatment, we encourage you to take your nausea medication as directed by your MD.   If you develop nausea and vomiting that is not controlled by your nausea medication, call the clinic.   BELOW ARE SYMPTOMS THAT SHOULD BE REPORTED IMMEDIATELY:  *FEVER GREATER THAN 100.5 F  *CHILLS WITH OR WITHOUT FEVER  NAUSEA AND VOMITING THAT IS NOT CONTROLLED WITH YOUR NAUSEA MEDICATION  *UNUSUAL SHORTNESS OF BREATH  *UNUSUAL BRUISING OR BLEEDING  TENDERNESS IN MOUTH AND THROAT WITH OR WITHOUT PRESENCE OF ULCERS  *URINARY PROBLEMS  *BOWEL PROBLEMS  UNUSUAL RASH Items with * indicate a potential emergency and should be followed up as soon as possible.  Feel free to call the clinic should you have any questions or concerns. The clinic phone number is [phone].  Please show the [facility] at check-in to the Emergency Department and triage nurse.

## 2019-10-15 ENCOUNTER — Ambulatory Visit
Admission: RE | Admit: 2019-10-15 | Discharge: 2019-10-15 | Disposition: A | Discharge status: Home / Self Care | Payer: Medicare Other | Source: Ambulatory Visit | Attending: Radiation Oncology | Admitting: Radiation Oncology

## 2019-10-15 DIAGNOSIS — C3412 Malignant neoplasm of upper lobe, left bronchus or lung: Secondary | ICD-10-CM | POA: Diagnosis not present

## 2019-10-15 DIAGNOSIS — Z510 Encounter for antineoplastic radiation therapy: Secondary | ICD-10-CM | POA: Diagnosis not present

## 2019-10-16 ENCOUNTER — Ambulatory Visit
Admission: RE | Admit: 2019-10-16 | Discharge: 2019-10-16 | Disposition: A | Discharge status: Home / Self Care | Payer: Medicare Other | Source: Ambulatory Visit | Attending: Radiation Oncology | Admitting: Radiation Oncology

## 2019-10-16 DIAGNOSIS — Z510 Encounter for antineoplastic radiation therapy: Secondary | ICD-10-CM | POA: Diagnosis not present

## 2019-10-16 DIAGNOSIS — C3412 Malignant neoplasm of upper lobe, left bronchus or lung: Secondary | ICD-10-CM | POA: Diagnosis not present

## 2019-10-17 ENCOUNTER — Ambulatory Visit
Admission: RE | Admit: 2019-10-17 | Discharge: 2019-10-17 | Disposition: A | Discharge status: Home / Self Care | Payer: Medicare Other | Source: Ambulatory Visit | Attending: Radiation Oncology | Admitting: Radiation Oncology

## 2019-10-17 ENCOUNTER — Emergency Department (HOSPITAL_COMMUNITY)
Admission: EM | Admit: 2019-10-17 | Discharge: 2019-10-17 | Disposition: A | Discharge status: Home / Self Care | Payer: Medicare Other | Attending: Emergency Medicine | Admitting: Emergency Medicine

## 2019-10-17 ENCOUNTER — Other Ambulatory Visit: Payer: Self-pay

## 2019-10-17 ENCOUNTER — Encounter (HOSPITAL_COMMUNITY): Payer: Self-pay

## 2019-10-17 ENCOUNTER — Emergency Department (HOSPITAL_COMMUNITY): Payer: Medicare Other

## 2019-10-17 DIAGNOSIS — Y929 Unspecified place or not applicable: Secondary | ICD-10-CM | POA: Diagnosis not present

## 2019-10-17 DIAGNOSIS — C3412 Malignant neoplasm of upper lobe, left bronchus or lung: Secondary | ICD-10-CM | POA: Diagnosis not present

## 2019-10-17 DIAGNOSIS — W228XXA Striking against or struck by other objects, initial encounter: Secondary | ICD-10-CM | POA: Diagnosis not present

## 2019-10-17 DIAGNOSIS — Z79899 Other long term (current) drug therapy: Secondary | ICD-10-CM | POA: Diagnosis not present

## 2019-10-17 DIAGNOSIS — S92531A Displaced fracture of distal phalanx of right lesser toe(s), initial encounter for closed fracture: Secondary | ICD-10-CM | POA: Diagnosis not present

## 2019-10-17 DIAGNOSIS — Z23 Encounter for immunization: Secondary | ICD-10-CM | POA: Diagnosis not present

## 2019-10-17 DIAGNOSIS — S92531B Displaced fracture of distal phalanx of right lesser toe(s), initial encounter for open fracture: Secondary | ICD-10-CM | POA: Diagnosis not present

## 2019-10-17 DIAGNOSIS — Y939 Activity, unspecified: Secondary | ICD-10-CM | POA: Diagnosis not present

## 2019-10-17 DIAGNOSIS — S92321A Displaced fracture of second metatarsal bone, right foot, initial encounter for closed fracture: Secondary | ICD-10-CM | POA: Diagnosis not present

## 2019-10-17 DIAGNOSIS — J449 Chronic obstructive pulmonary disease, unspecified: Secondary | ICD-10-CM | POA: Diagnosis not present

## 2019-10-17 DIAGNOSIS — I10 Essential (primary) hypertension: Secondary | ICD-10-CM | POA: Diagnosis not present

## 2019-10-17 DIAGNOSIS — Y999 Unspecified external cause status: Secondary | ICD-10-CM | POA: Insufficient documentation

## 2019-10-17 DIAGNOSIS — F17210 Nicotine dependence, cigarettes, uncomplicated: Secondary | ICD-10-CM | POA: Diagnosis not present

## 2019-10-17 DIAGNOSIS — S99921A Unspecified injury of right foot, initial encounter: Secondary | ICD-10-CM | POA: Diagnosis present

## 2019-10-17 DIAGNOSIS — Z510 Encounter for antineoplastic radiation therapy: Secondary | ICD-10-CM | POA: Diagnosis not present

## 2019-10-17 MED ORDER — LIDOCAINE HCL (PF) 1 % IJ SOLN
5.0000 mL | Freq: Once | INTRAMUSCULAR | Status: DC
  Filled 2019-10-17: qty 30

## 2019-10-17 MED ORDER — TETANUS-DIPHTH-ACELL PERTUSSIS 5-2.5-18.5 LF-MCG/0.5 IM SUSP
0.5000 mL | Freq: Once | INTRAMUSCULAR | Status: AC
Start: 2019-10-17 — End: 2019-10-17
  Administered 2019-10-17: 0.5 mL via INTRAMUSCULAR
  Filled 2019-10-17: qty 0.5

## 2019-10-17 MED ORDER — CEFAZOLIN SODIUM-DEXTROSE 2-4 GM/100ML-% IV SOLN
2.0000 g | Freq: Once | INTRAVENOUS | Status: AC
  Administered 2019-10-17: 2 g via INTRAVENOUS
  Filled 2019-10-17: qty 100

## 2019-10-17 MED ORDER — CEPHALEXIN 500 MG PO CAPS
500.0000 mg | ORAL_CAPSULE | Freq: Two times a day (BID) | ORAL | 0 refills | Status: DC
Start: 2019-10-17 — End: 2020-03-11

## 2019-10-17 NOTE — Discharge Instructions (Signed)
You have been seen here for laceration of the right foot.  X-ray shows you have a fracture.  I prescribed you antibiotics please take as prescribed.  I want you to clean the wound and change dressings twice a day.  You may take over-the-counter pain medications like ibuprofen or Tylenol every 6 hours as needed for pain.  Please follow dosing on the back of bottle.  I spoke with [facility] they would like you to see Dr. Doran Durand tomorrow please call and schedule an appointment with them for further evaluation.  I want to come back to emergency department if you develop increased swelling around your toe, increased redness, pain, your toe turns a different color, you develop a fever chills, shortness of breath chest pain as these symptoms require further evaluation management.

## 2019-10-17 NOTE — ED Provider Notes (Signed)
[facility] DEPT Provider Note   CSN: [id number] Arrival date & time: 10/17/19  1209     History Chief Complaint  Patient presents with  . Toe Pain    Reginald Berry is a 69 y.o. male.  HPI   Patient presents to the emergency department with chief complaint of right foot pain that started yesterday.  He states that he does not walk and moves around on a electric wheelchair, he was moving around in his house was not able to stop in time before his foot hit an end table.  He states the second digit hit the table and it was in pain.  He noticed blood and wrapped it up and came here for evaluation.  He denies hitting his head, losing conscious, is not on anticoags.  He denies alleviating or aggravating factors.  Patient has significant medical history of non-small cell lung cancer currently on chemo radiation, received last treatment on 07/26 cycle 1, received radiation therapy yesterday, he has COPD, hypertension, acid reflux, renal cell carcinoma.  He denies headache, fever, chills, shortness breath, chest pain, abdominal pain, increased pedal edema.  Past Medical History:  Diagnosis Date  . Aortic atherosclerosis ([place])   . Arthritis   . Chronic low back pain   . COPD (chronic obstructive pulmonary disease) ([place])   . Coronary artery calcification seen on CT scan    Multivessel  . Essential hypertension   . Gait instability    Previous logging accident  . GERD (gastroesophageal reflux disease)   . Lung cancer ([place])    Poorly differentiated, XRT  . Renal cell carcinoma ([place])    Left, status post cryoablation August 2020    Patient Active Problem List   Diagnosis Date Noted  . Encounter for antineoplastic chemotherapy 10/07/2019  . Goals of care, counseling/discussion 09/25/2019  . Non-small cell carcinoma of left lung, stage 3 ([place]) 09/25/2019  . Left renal mass 10/31/2018  . Malignant neoplasm of upper lobe of left lung ([facility]) 07/26/2017     Past Surgical History:  Procedure Laterality Date  . CHOLECYSTECTOMY     2019  . FRACTURE SURGERY     bil legs (logging trees)  . IR RADIOLOGIST EVAL & MGMT  09/27/2018  . IR RADIOLOGIST EVAL & MGMT  11/22/2018  . IR RADIOLOGIST EVAL & MGMT  04/10/2019  . NECK SURGERY  1994-1995  . RADIOLOGY WITH ANESTHESIA Left 10/31/2018   Procedure: CT WITH ANESTHESIA RENAL CRYO ABLATION;  Surgeon: Aletta Edouard, MD;  Location: [facility];  Service: Radiology;  Laterality: Left;       Family History  Problem Relation Age of Onset  . Heart attack Mother   . Renal Disease Mother   . Heart failure Father   . Heart attack Father   . Diabetes Sister   . Heart disease Brother     Social History   Tobacco Use  . Smoking status: Current Every Day Smoker    Packs/day: 0.50  . Smokeless tobacco: Never Used  . Tobacco comment: 3 a day  Vaping Use  . Vaping Use: Never used  Substance Use Topics  . Alcohol use: Never    Comment: BEER ONCE IN A WHILE   . Drug use: Never    Home Medications Prior to Admission medications   Medication Sig Start Date End Date Taking? Authorizing Provider  amLODipine (NORVASC) 10 MG tablet Take 10 mg by mouth daily.    [provider]  aspirin 81 MG  EC tablet Take 81 mg by mouth daily. Swallow whole.    [provider]  buPROPion (WELLBUTRIN XL) 300 MG 24 hr tablet Take 300 mg by mouth daily. 07/26/18   [provider]  busPIRone (BUSPAR) 15 MG tablet Take 15 mg by mouth daily. 07/26/18   [provider]  cephALEXin (KEFLEX) 500 MG capsule Take 1 capsule (500 mg total) by mouth 2 (two) times daily. 10/17/19   Marcello Fennel, PA-C  cetirizine (ZYRTEC) 10 MG tablet Take 10 mg by mouth daily as needed for allergies.    [provider]  furosemide (LASIX) 40 MG tablet Take 40 mg by mouth daily.     [provider]  hydrochlorothiazide (MICROZIDE) 12.5 MG capsule Take 12.5 mg by mouth daily.    [provider]  liothyronine (CYTOMEL) 25 MCG tablet Take 25 mcg by mouth daily. 07/26/18   [provider]  losartan (COZAAR) 100 MG tablet Take 100 mg by mouth daily. 07/26/18   [provider]  Multiple Vitamins-Minerals (CENTRUM SILVER 50+MEN) TABS Take 1 tablet by mouth daily.    [provider]  oxyCODONE-acetaminophen (PERCOCET) 10-325 MG tablet Take 1 tablet by mouth every 4 (four) hours as needed for pain.    [provider]  pantoprazole (PROTONIX) 40 MG tablet Take 40 mg by mouth every evening.     [provider]  Probiotic Product (PROBIOTIC DAILY PO) Take 1 capsule by mouth daily.    [provider]  prochlorperazine (COMPAZINE) 10 MG tablet Take 1 tablet (10 mg total) by mouth every 6 (six) hours as needed. 09/25/19   Heilingoetter, Cassandra L, PA-C  rosuvastatin (CRESTOR) 5 MG tablet Take 5 mg by mouth at bedtime.    [provider]  sildenafil (VIAGRA) 100 MG tablet Take 100 mg by mouth daily as needed for erectile dysfunction.    [provider]    Allergies    Patient has no known allergies.  Review of Systems   Review of Systems  Constitutional: Negative for chills and fever.  HENT: Negative for congestion.   Respiratory: Negative for shortness of breath.   Cardiovascular: Negative for chest pain.  Gastrointestinal: Negative for abdominal pain.  Genitourinary: Negative for enuresis.  Musculoskeletal: Negative for back pain.       Admits to right foot pain.  Skin: Negative for rash.  Neurological: Negative for dizziness.  Hematological: Does not bruise/bleed easily.    Physical Exam Updated Vital Signs BP (!) 126/58 (BP Location: Right Arm)   Pulse 89   Temp 98.2 F (36.8 C) (Oral)   Resp 18   SpO2 96%   Physical Exam Vitals and nursing note reviewed.  Constitutional:      General: He is not in acute distress.    Appearance: Normal appearance. He is not ill-appearing or diaphoretic.   HENT:     Head: Normocephalic and atraumatic.     Nose: No congestion or rhinorrhea.  Eyes:     General: No scleral icterus.       Right eye: No discharge.        Left eye: No discharge.     Conjunctiva/sclera: Conjunctivae normal.  Pulmonary:     Effort: Pulmonary effort is normal. No respiratory distress.     Breath sounds: Normal breath sounds. No wheezing.  Musculoskeletal:     Cervical back: Neck supple.     Right lower leg: No edema.     Left lower leg: No edema.  Comments: Right foot was visualized, second digit had a laceration at the distal joint, partial amputation.  Nailbed involved, neurovascular fully intact.  Skin:    General: Skin is warm and dry.     Coloration: Skin is not jaundiced or pale.  Neurological:     Mental Status: He is alert and oriented to person, place, and time.  Psychiatric:        Mood and Affect: Mood normal.            ED Results / Procedures / Treatments   Labs (all labs ordered are listed, but only abnormal results are displayed) Labs Reviewed - No data to display  EKG None  Radiology DG Foot Complete Right  Result Date: 10/17/2019 CLINICAL DATA:  Blunt trauma last night with right second toe pain. EXAM: RIGHT FOOT COMPLETE - 3+ VIEW COMPARISON:  None. FINDINGS: Examination demonstrates diffuse decreased bone mineralization. There is a displaced comminuted fracture of the second distal phalanx. No additional fractures identified. Minimal degenerate change over the first MTP joint. Moderate soft tissue swelling over the dorsum of the mid to forefoot. Partially visualized hardware intact over the distal tibia. IMPRESSION: Displaced comminuted fracture of the second distal phalanx. Electronically Signed   By: Marin Olp M.D.   On: 10/17/2019 14:16    Procedures .Marland KitchenLaceration Repair  Date/Time: 10/17/2019 4:44 PM Performed by: Marcello Fennel, PA-C Authorized by: Marcello Fennel, PA-C   Consent:    Consent  obtained:  Verbal   Consent given by:  Patient   Risks discussed:  Infection, pain, retained foreign body, need for additional repair, poor cosmetic result, tendon damage, vascular damage, poor wound healing and nerve damage   Alternatives discussed:  No treatment Anesthesia (see MAR for exact dosages):    Anesthesia method:  Local infiltration   Local anesthetic:  Lidocaine 1% w/o epi Laceration details:    Location:  Foot   Foot location: Right foot second phalanx.   Length (cm):  3   Depth (mm):  2 Pre-procedure details:    Preparation:  Patient was prepped and draped in usual sterile fashion Exploration:    Wound exploration: wound explored through full range of motion and entire depth of wound probed and visualized     Contaminated: no   Treatment:    Area cleansed with:  Betadine and saline   Amount of cleaning:  Standard   Irrigation method:  Syringe   Visualized foreign bodies/material removed: no   Approximation:    Approximation:  Loose Post-procedure details:    Dressing:  Non-adherent dressing and splint for protection   Patient tolerance of procedure:  Tolerated well, no immediate complications   (including critical care time)  Medications Ordered in ED Medications  lidocaine (PF) (XYLOCAINE) 1 % injection 5 mL (has no administration in time range)  ceFAZolin (ANCEF) IVPB 2g/100 mL premix (0 g Intravenous Stopped 10/17/19 1506)  Tdap (BOOSTRIX) injection 0.5 mL (0.5 mLs Intramuscular Given 10/17/19 1617)    ED Course  I have reviewed the triage vital signs and the nursing notes.  Pertinent labs & imaging results that were available during my care of the patient were reviewed by me and considered in my medical decision making (see chart for details).    MDM Rules/Calculators/A&P                          I have personally reviewed all imaging, labs and have interpreted them.  Patient  was alert and oriented did not appear to be in acute distress.  Exam showed  right foot second digit injury, partial amputation noted at the distal joint.  Neurovascular fully intact.  Due to concern for open fracture will start patient on Ancef and obtain imaging.  X-ray of foot shows displaced comminuted fracture of the second distal phalanx, after reviewing patient's chart has no record of tetanus shot seen will add tetanus shot.  Will numb up the area and clean out wound and contact Ortho for further evaluation.  Performed a nerve block, wound was cleaned and further evaluated, was cut off at cuticle, fifth percent amputation of the distal end of the second right phalanx.  Neurovascular fully intact.  Will defer suturing as it has been over 6 hours as suturing would increase risk of infection will place patient on antibiotics and cover wound.  Spoke with Dr. Ihor Gully who states patient toe should be wrapped up and placed in a splint.  He recommends that he sees Dr. Doran Durand tomorrow for further evaluation.  Recommends antibiotic treat for possible open fracture.  Low suspicion for systemic infection as patient was nontoxic-appearing, vital signs reassuring, no obvious source of infection seen on exam.  Low suspicion for compartment syndrome as neurovascular is fully intact.  Low suspicion for tendon or ligament damage as patient was able to articulate the right second digit without difficulty.  Low suspicion for cellulitis as there is no erythema, edema, apparent discharge noted on exam.  Patient appears resting calmly in bed show no acute signs stress.  Vital signs remained stable does not meet criteria to be admitted to the hospital.  Patient has a comminuted fracture of the right second phalanx.  Will prescribe Keflex for possible infection, cover wound and placed in a postop boot for protection.  Will see Dr. Doran Durand for further evaluation.  Patient was discussed with attending who agrees with assessment and plan.  Patient was given at home.  Will strict return precautions.   Patient verbalized understood and agreed to plan. Final Clinical Impression(s) / ED Diagnoses Final diagnoses:  Open displaced fracture of distal phalanx of lesser toe of right foot, initial encounter    Rx / DC Orders ED Discharge Orders         Ordered    cephALEXin (KEFLEX) 500 MG capsule  2 times daily     Discontinue  Reprint     10/17/19 [place], Libero Puthoff J, PA-C 10/17/19 1648    Daleen Bo, MD 10/17/19 [phone]

## 2019-10-17 NOTE — ED Provider Notes (Signed)
  Face-to-face evaluation   History: Obese debilitated man presenting with injury to right fifth toe.  Physical exam: At least 50% amputation type of right fifth toe, exposing bony material.  This injury is subacute.  No proximal streaking erythema or tenderness of the foot or toes.  Medical screening examination/treatment/procedure(s) were conducted as a shared visit with non-physician practitioner(s) and myself.  I personally evaluated the patient during the encounter    Daleen Bo, MD 10/17/19 1954

## 2019-10-17 NOTE — ED Triage Notes (Signed)
Pt reports hitting foot on coffee table last night. Pt now endorses right second toe pain.

## 2019-10-17 NOTE — Progress Notes (Signed)
Orthopedic Tech Progress Note Patient Details:  Reginald Berry [birth year] [id number]  Ortho Devices Type of Ortho Device: Postop shoe/boot Ortho Device/Splint Location: right Ortho Device/Splint Interventions: Application   Post Interventions Patient Tolerated: Well Instructions Provided: Care of device   [place] Pink 10/17/2019, 5:05 PM

## 2019-10-18 ENCOUNTER — Ambulatory Visit
Admission: RE | Admit: 2019-10-18 | Discharge: 2019-10-18 | Disposition: A | Discharge status: Home / Self Care | Payer: Medicare Other | Source: Ambulatory Visit | Attending: Radiation Oncology | Admitting: Radiation Oncology

## 2019-10-18 DIAGNOSIS — C3412 Malignant neoplasm of upper lobe, left bronchus or lung: Secondary | ICD-10-CM | POA: Diagnosis not present

## 2019-10-18 DIAGNOSIS — M79674 Pain in right toe(s): Secondary | ICD-10-CM | POA: Diagnosis not present

## 2019-10-18 DIAGNOSIS — Z510 Encounter for antineoplastic radiation therapy: Secondary | ICD-10-CM | POA: Diagnosis not present

## 2019-10-18 DIAGNOSIS — S92911B Unspecified fracture of right toe(s), initial encounter for open fracture: Secondary | ICD-10-CM | POA: Diagnosis not present

## 2019-10-19 NOTE — Progress Notes (Deleted)
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 Diagnosed in 2019. 2)stage Ia3(T1cN0M0)non-small cell lung cancer. Diagnosed with a left suprahilar region with AP window adenopathy. Status post SBRT 3)renal cell carcinoma status post CT-guided ablation in July 2020. Followed by Dr. [name]. 4)recurrent non-small cell lung cancer in July 2021. Patient presented with new adenopathy in the AP window. Inaccessible to biopsy  PRIOR THERAPY: 1) SBRT to the LUL nodule in 2019 under the care of Dr. Lisbeth Renshaw 2) SBRT to the left suprahilar mass under the care of Dr. Lisbeth Renshaw in March 2020 3) CT guided ablation to the renal cell carcinoma under the care of Dr. Kathlene Cote.  CURRENT THERAPY: Concurrent chemoradiation with carboplatin for an AUC of 2 and paclitaxel 45 mg/m.  First dose on 09/30/2019.  Status post 3 cycle.  INTERVAL HISTORY: Reginald Berry 69 y.o. male returns to the clinic today for a follow up visit. The patient is feeling _ today without any concerning complaints except he fractured his toe last week on 8/12 and was seen in the ER. He is following with Dr. Doran Durand in orthopedics. He received Ancef for antibiotic prophylaxis.  Otherwise, he has been tolerating his treatment well without any adverse side effects. patient's last day of radiation is scheduled for 11/15/19. denies any recent fevers, chills, night sweats, or weight loss. Denies any nausea, vomiting, or diarrhea. He has frequent and chronic constipation and takes laxatives as needed. Denies any chest pain or hemoptysis. He denies chest pain at this time. He follows with cardiology. He reports his baseline shortness of breath with exertion and a chronic cough since undergoing treatment for his lung cancer in 2019. He denies headaches or visual changes. He is here  today for evaluation before starting cycle #4.   MEDICAL HISTORY: Past Medical History:  Diagnosis Date  . Aortic atherosclerosis ([place])   . Arthritis   . Chronic low back pain   . COPD (chronic obstructive pulmonary disease) ([place])   . Coronary artery calcification seen on CT scan    Multivessel  . Essential hypertension   . Gait instability    Previous logging accident  . GERD (gastroesophageal reflux disease)   . Lung cancer ([place])    Poorly differentiated, XRT  . Renal cell carcinoma ([facility])    Left, status post cryoablation August 2020    ALLERGIES:  has No Known Allergies.  MEDICATIONS:  Current Outpatient Medications  Medication Sig Dispense Refill  . amLODipine (NORVASC) 10 MG tablet Take 10 mg by mouth daily.    Marland Kitchen aspirin 81 MG EC tablet Take 81 mg by mouth daily. Swallow whole.    Marland Kitchen buPROPion (WELLBUTRIN XL) 300 MG 24 hr tablet Take 300 mg by mouth daily.    . busPIRone (BUSPAR) 15 MG tablet Take 15 mg by mouth daily.    . cephALEXin (KEFLEX) 500 MG capsule Take 1 capsule (500 mg total) by mouth 2 (two) times daily. 20 capsule 0  . cetirizine (ZYRTEC) 10 MG tablet Take 10 mg by mouth daily as needed for allergies.    . furosemide (LASIX) 40 MG tablet Take 40 mg by mouth daily.     . hydrochlorothiazide (MICROZIDE) 12.5 MG capsule Take 12.5 mg by mouth daily.    Marland Kitchen liothyronine (CYTOMEL) 25 MCG tablet Take 25 mcg by mouth daily.    Marland Kitchen  losartan (COZAAR) 100 MG tablet Take 100 mg by mouth daily.    . Multiple Vitamins-Minerals (CENTRUM SILVER 50+MEN) TABS Take 1 tablet by mouth daily.    Marland Kitchen oxyCODONE-acetaminophen (PERCOCET) 10-325 MG tablet Take 1 tablet by mouth every 4 (four) hours as needed for pain.    . pantoprazole (PROTONIX) 40 MG tablet Take 40 mg by mouth every evening.     . Probiotic Product (PROBIOTIC DAILY PO) Take 1 capsule by mouth daily.    . prochlorperazine (COMPAZINE) 10 MG tablet Take 1 tablet (10 mg total) by mouth every 6 (six) hours as needed. 30 tablet  2  . rosuvastatin (CRESTOR) 5 MG tablet Take 5 mg by mouth at bedtime.    . sildenafil (VIAGRA) 100 MG tablet Take 100 mg by mouth daily as needed for erectile dysfunction.     No current facility-administered medications for this visit.    SURGICAL HISTORY:  Past Surgical History:  Procedure Laterality Date  . CHOLECYSTECTOMY     2019  . FRACTURE SURGERY     bil legs (logging trees)  . IR RADIOLOGIST EVAL & MGMT  09/27/2018  . IR RADIOLOGIST EVAL & MGMT  11/22/2018  . IR RADIOLOGIST EVAL & MGMT  04/10/2019  . NECK SURGERY  1994-1995  . RADIOLOGY WITH ANESTHESIA Left 10/31/2018   Procedure: CT WITH ANESTHESIA RENAL CRYO ABLATION;  Surgeon: Aletta Edouard, MD;  Location: [facility];  Service: Radiology;  Laterality: Left;    REVIEW OF SYSTEMS:   Review of Systems  Constitutional: Negative for appetite change, chills, fatigue, fever and unexpected weight change.  HENT:   Negative for mouth sores, nosebleeds, sore throat and trouble swallowing.   Eyes: Negative for eye problems and icterus.  Respiratory: Negative for cough, hemoptysis, shortness of breath and wheezing.   Cardiovascular: Negative for chest pain and leg swelling.  Gastrointestinal: Negative for abdominal pain, constipation, diarrhea, nausea and vomiting.  Genitourinary: Negative for bladder incontinence, difficulty urinating, dysuria, frequency and hematuria.   Musculoskeletal: Negative for back pain, gait problem, neck pain and neck stiffness.  Skin: Negative for itching and rash.  Neurological: Negative for dizziness, extremity weakness, gait problem, headaches, light-headedness and seizures.  Hematological: Negative for adenopathy. Does not bruise/bleed easily.  Psychiatric/Behavioral: Negative for confusion, depression and sleep disturbance. The patient is not nervous/anxious.     PHYSICAL EXAMINATION:  There were no vitals taken for this visit.  ECOG PERFORMANCE STATUS: {CHL ONC ECOG [id number]  Physical Exam   Constitutional: Oriented to person, place, and time and well-developed, well-nourished, and in no distress. No distress.  HENT:  Head: Normocephalic and atraumatic.  Mouth/Throat: Oropharynx is clear and moist. No oropharyngeal exudate.  Eyes: Conjunctivae are normal. Right eye exhibits no discharge. Left eye exhibits no discharge. No scleral icterus.  Neck: Normal range of motion. Neck supple.  Cardiovascular: Normal rate, regular rhythm, normal heart sounds and intact distal pulses.   Pulmonary/Chest: Effort normal and breath sounds normal. No respiratory distress. No wheezes. No rales.  Abdominal: Soft. Bowel sounds are normal. Exhibits no distension and no mass. There is no tenderness.  Musculoskeletal: Normal range of motion. Exhibits no edema.  Lymphadenopathy:    No cervical adenopathy.  Neurological: Alert and oriented to person, place, and time. Exhibits normal muscle tone. Gait normal. Coordination normal.  Skin: Skin is warm and dry. No rash noted. Not diaphoretic. No erythema. No pallor.  Psychiatric: Mood, memory and judgment normal.  Vitals reviewed.  LABORATORY DATA: Lab Results  Component Value  Date   WBC 5.8 10/14/2019   HGB 13.2 10/14/2019   HCT 39.5 10/14/2019   MCV 84.9 10/14/2019   PLT 243 10/14/2019      Chemistry      Component Value Date/Time   NA 135 10/14/2019 1239   K 4.2 10/14/2019 1239   CL 108 10/14/2019 1239   CO2 22 10/14/2019 1239   BUN 16 10/14/2019 1239   CREATININE 1.02 10/14/2019 1239      Component Value Date/Time   CALCIUM 9.1 10/14/2019 1239   ALKPHOS 83 10/14/2019 1239   AST 13 (L) 10/14/2019 1239   ALT 15 10/14/2019 1239   BILITOT 0.3 10/14/2019 1239       RADIOGRAPHIC STUDIES:  DG Foot Complete Right  Result Date: 10/17/2019 CLINICAL DATA:  Blunt trauma last night with right second toe pain. EXAM: RIGHT FOOT COMPLETE - 3+ VIEW COMPARISON:  None. FINDINGS: Examination demonstrates diffuse decreased bone mineralization.  There is a displaced comminuted fracture of the second distal phalanx. No additional fractures identified. Minimal degenerate change over the first MTP joint. Moderate soft tissue swelling over the dorsum of the mid to forefoot. Partially visualized hardware intact over the distal tibia. IMPRESSION: Displaced comminuted fracture of the second distal phalanx. Electronically Signed   By: Marin Olp M.D.   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 Diagnosed in 2019. 2)stage Ia3(T1cN0M0)non-small cell lung cancer. Diagnosed with a left suprahilar region with AP window adenopathy. Status post SBRT 3)renal cell carcinoma status post CT-guided ablation in July 2020. Followed by Dr. [name]. 4)recurrent non-small cell lung cancer in July 2021. Patient presented with new adenopathy in the AP window. No accessible to biopsy.  The patient is currently undergoing concurrent chemoradiation with carboplatin for an AUC of 2 and paclitaxel 45 mg/m2. He is status post 3 cyclse. His last day of radiation is scheduled for 11/15/19.   Labs were reviewed. Recommend that he proceed with cycle #4 today as scheduled.  I will see him back for a follow up visit in 2 weeks for evaluation before starting cycle #6.   The patient was advised to call immediately if he has any concerning symptoms in the interval. The patient voices understanding of current disease status and treatment options and is in agreement with the current care plan. All questions were answered. The patient knows to call the clinic with any problems, questions or concerns. We can certainly see the patient much sooner if necessary    No orders of the defined types were placed in this encounter.    Sajad Glander L Kenniyah Sasaki, PA-C 10/19/19

## 2019-10-21 ENCOUNTER — Ambulatory Visit: Payer: Medicare Other

## 2019-10-21 ENCOUNTER — Inpatient Hospital Stay: Payer: Medicare Other

## 2019-10-21 ENCOUNTER — Inpatient Hospital Stay: Payer: Medicare Other | Admitting: Physician Assistant

## 2019-10-22 ENCOUNTER — Ambulatory Visit
Admission: RE | Admit: 2019-10-22 | Discharge: 2019-10-22 | Disposition: A | Discharge status: Home / Self Care | Payer: Medicare Other | Source: Ambulatory Visit | Attending: Radiation Oncology | Admitting: Radiation Oncology

## 2019-10-22 ENCOUNTER — Other Ambulatory Visit: Payer: Self-pay

## 2019-10-22 DIAGNOSIS — Z510 Encounter for antineoplastic radiation therapy: Secondary | ICD-10-CM | POA: Diagnosis not present

## 2019-10-22 DIAGNOSIS — C3412 Malignant neoplasm of upper lobe, left bronchus or lung: Secondary | ICD-10-CM | POA: Diagnosis not present

## 2019-10-23 ENCOUNTER — Ambulatory Visit
Admission: RE | Admit: 2019-10-23 | Discharge: 2019-10-23 | Disposition: A | Discharge status: Home / Self Care | Payer: Medicare Other | Source: Ambulatory Visit | Attending: Radiation Oncology | Admitting: Radiation Oncology

## 2019-10-23 ENCOUNTER — Other Ambulatory Visit: Payer: Self-pay

## 2019-10-23 DIAGNOSIS — Z510 Encounter for antineoplastic radiation therapy: Secondary | ICD-10-CM | POA: Diagnosis not present

## 2019-10-23 DIAGNOSIS — C3412 Malignant neoplasm of upper lobe, left bronchus or lung: Secondary | ICD-10-CM | POA: Diagnosis not present

## 2019-10-24 ENCOUNTER — Ambulatory Visit
Admission: RE | Admit: 2019-10-24 | Discharge: 2019-10-24 | Disposition: A | Discharge status: Home / Self Care | Payer: Medicare Other | Source: Ambulatory Visit | Attending: Radiation Oncology | Admitting: Radiation Oncology

## 2019-10-24 ENCOUNTER — Other Ambulatory Visit: Payer: Self-pay

## 2019-10-24 DIAGNOSIS — C3412 Malignant neoplasm of upper lobe, left bronchus or lung: Secondary | ICD-10-CM | POA: Diagnosis not present

## 2019-10-24 DIAGNOSIS — Z510 Encounter for antineoplastic radiation therapy: Secondary | ICD-10-CM | POA: Diagnosis not present

## 2019-10-25 ENCOUNTER — Ambulatory Visit
Admission: RE | Admit: 2019-10-25 | Discharge: 2019-10-25 | Disposition: A | Discharge status: Home / Self Care | Payer: Medicare Other | Source: Ambulatory Visit | Attending: Radiation Oncology | Admitting: Radiation Oncology

## 2019-10-25 ENCOUNTER — Other Ambulatory Visit: Payer: Self-pay

## 2019-10-25 DIAGNOSIS — C3412 Malignant neoplasm of upper lobe, left bronchus or lung: Secondary | ICD-10-CM | POA: Diagnosis not present

## 2019-10-25 DIAGNOSIS — M79674 Pain in right toe(s): Secondary | ICD-10-CM | POA: Diagnosis not present

## 2019-10-25 DIAGNOSIS — Z510 Encounter for antineoplastic radiation therapy: Secondary | ICD-10-CM | POA: Diagnosis not present

## 2019-10-25 DIAGNOSIS — S92911D Unspecified fracture of right toe(s), subsequent encounter for fracture with routine healing: Secondary | ICD-10-CM | POA: Diagnosis not present

## 2019-10-26 NOTE — Progress Notes (Signed)
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 Diagnosed in 2019. 2)stage Ia3(T1cN0M0)non-small cell lung cancer. Diagnosed with a left suprahilar region with AP window adenopathy. Status post SBRT 3)renal cell carcinoma status post CT-guided ablation in July 2020. Followed by Dr. [name]. 4)recurrent non-small cell lung cancer in July 2021. Patient presented with new adenopathy in the AP window. Inaccessible to biopsy  PRIOR THERAPY: 1) SBRT to the LUL nodule in 2019 under the care of Dr. Lisbeth Renshaw 2) SBRT to the left suprahilar mass under the care of Dr. Lisbeth Renshaw in March 2020 3) CT guided ablation to the renal cell carcinoma under the care of Dr. Kathlene Cote.  CURRENT THERAPY: Concurrent chemoradiation with carboplatin for an AUC of 2 and paclitaxel 45 mg/m.  First dose on 09/30/2019.  Status post 3 cycles.  INTERVAL HISTORY: Reginald Berry 69 y.o. male returns to the clinic today for a follow up visit accompanied by his sister. In the interval since his last visit, the patient had hit his foot on the table in his house. He was seen in the emergency room on 10/17/19 and further evaluation showed he had a displaced comminuted fracture of the second distal phalanx. He is following with orthopedics.   Otherwise, he missed his chemotherapy appointment last week due to vertigo. He has received 3 weekly doses of chemotherapy. His last day of radiation is expected on 11/18/19. He has been tolerating his chemotherapy well without any concerning complaints. The patient is having a lot of challenges related to his living situation. He is in touch with the social worker team who has been helping him with his constraints. He recently had pest controll at his house to help with bed bugs. He denies any recent fevers, chills, night sweats,  or weight loss.  Denies any nausea, vomiting, or diarrhea. He has frequent and chronic constipation due to his percocet use for his chronic back pain and he takes laxatives as needed. Denies any chest pain or hemoptysis. He reports his baseline shortness of breath with exertion and a chronic cough since undergoing treatment for his lung cancer in 2019. He states he sometimes has been feeling "strangled" recently but he was unable to elaborate if this was related to swallowing food or airway. He denies headaches or visual changes. He is here today for evaluation before starting cycle #4.   MEDICAL HISTORY: Past Medical History:  Diagnosis Date  . Aortic atherosclerosis ([place])   . Arthritis   . Chronic low back pain   . COPD (chronic obstructive pulmonary disease) ([place])   . Coronary artery calcification seen on CT scan    Multivessel  . Essential hypertension   . Gait instability    Previous logging accident  . GERD (gastroesophageal reflux disease)   . Lung cancer ([place])    Poorly differentiated, XRT  . Renal cell carcinoma ([facility])    Left, status post cryoablation August 2020    ALLERGIES:  has No Known Allergies.  MEDICATIONS:  Current Outpatient Medications  Medication Sig Dispense Refill  . amLODipine (NORVASC) 10 MG tablet Take 10 mg by mouth daily.    Marland Kitchen aspirin 81 MG EC tablet Take 81 mg by mouth daily. Swallow whole.    Marland Kitchen buPROPion (WELLBUTRIN XL) 300 MG 24 hr tablet Take 300 mg by mouth daily.    Marland Kitchen  busPIRone (BUSPAR) 15 MG tablet Take 15 mg by mouth daily.    . cephALEXin (KEFLEX) 500 MG capsule Take 1 capsule (500 mg total) by mouth 2 (two) times daily. 20 capsule 0  . cetirizine (ZYRTEC) 10 MG tablet Take 10 mg by mouth daily as needed for allergies.    . furosemide (LASIX) 40 MG tablet Take 40 mg by mouth daily.     . hydrochlorothiazide (MICROZIDE) 12.5 MG capsule Take 12.5 mg by mouth daily.    Marland Kitchen liothyronine (CYTOMEL) 25 MCG tablet Take 25 mcg by mouth daily.    Marland Kitchen  losartan (COZAAR) 100 MG tablet Take 100 mg by mouth daily.    . Multiple Vitamins-Minerals (CENTRUM SILVER 50+MEN) TABS Take 1 tablet by mouth daily.    Marland Kitchen oxyCODONE-acetaminophen (PERCOCET) 10-325 MG tablet Take 1 tablet by mouth every 4 (four) hours as needed for pain.    . pantoprazole (PROTONIX) 40 MG tablet Take 40 mg by mouth every evening.     . Probiotic Product (PROBIOTIC DAILY PO) Take 1 capsule by mouth daily.    . prochlorperazine (COMPAZINE) 10 MG tablet Take 1 tablet (10 mg total) by mouth every 6 (six) hours as needed. 30 tablet 2  . rosuvastatin (CRESTOR) 5 MG tablet Take 5 mg by mouth at bedtime.    . sildenafil (VIAGRA) 100 MG tablet Take 100 mg by mouth daily as needed for erectile dysfunction.     No current facility-administered medications for this visit.    SURGICAL HISTORY:  Past Surgical History:  Procedure Laterality Date  . CHOLECYSTECTOMY     2019  . FRACTURE SURGERY     bil legs (logging trees)  . IR RADIOLOGIST EVAL & MGMT  09/27/2018  . IR RADIOLOGIST EVAL & MGMT  11/22/2018  . IR RADIOLOGIST EVAL & MGMT  04/10/2019  . NECK SURGERY  1994-1995  . RADIOLOGY WITH ANESTHESIA Left 10/31/2018   Procedure: CT WITH ANESTHESIA RENAL CRYO ABLATION;  Surgeon: Aletta Edouard, MD;  Location: [facility];  Service: Radiology;  Laterality: Left;    REVIEW OF SYSTEMS:   Review of Systems  Constitutional:Positive for fatigue and generalized weakness.Negative for appetite change, chills, fever and unexpected weight change.  HENT: Negative for mouth sores, nosebleeds, sore throat and trouble swallowing.  Eyes: Negative for eye problems and icterus.  Respiratory:Positive for chronic cough and shortness of breath.Negative for hemoptysis and wheezing.  Cardiovascular:Positive for bilateral lower extremity swelling.Negative for chest pain. Gastrointestinal: Positive for chronic and frequent constipation. Negative for abdominal pain, diarrhea, nausea and vomiting.   Genitourinary: Negative for bladder incontinence, difficulty urinating, dysuria, frequency and hematuria.  Musculoskeletal:Positive for chronic neck and back pain secondary to an old injury. Skin:Positive for multiple scabs on his extremities. Neurological: Negative for dizziness, extremity weakness, gait problem, headaches, light-headedness and seizures.  Hematological: Negative for adenopathy. Does not bruise/bleed easily.  Psychiatric/Behavioral: Negative for confusion, depression and sleep disturbance. The patient is not nervous/anxious.     PHYSICAL EXAMINATION:  Blood pressure (!) 143/55, pulse 78, temperature 99.1 F (37.3 C), temperature source Tympanic, resp. rate 17, height 6\' 2"  (1.88 m), weight (!) 346 lb (156.9 kg), SpO2 100 %.  ECOG PERFORMANCE STATUS: 2-3  Physical Exam  Constitutional: Oriented to person, place, and time andappears older than stated age.No acute distress. HENT:  Head: Normocephalic and atraumatic.  Mouth/Throat: Oropharynx is clear and moist. No oropharyngeal exudate.  Eyes: Conjunctivae are normal. Right eye exhibits no discharge. Left eye exhibits no discharge. No scleral icterus.  Neck: Normal range of motion. Neck supple.  Cardiovascular: Normal rate, regular rhythm, normal heart sounds and intact distal pulses.  Pulmonary/Chest: Effort normal. Rhonchi noted. No respiratory distress. No rales.  Abdominal: Soft. Bowel sounds are normal. Exhibits no distension and no mass. There is no tenderness.  Musculoskeletal: Bilateral lower extremity edema.normal range of motion.  Lymphadenopathy:  No cervical adenopathy.  Neurological: Alert and oriented to person, place, and time. Exhibits muscle wasting.  Skin: Skin is warm and dry.Chronic venous insufficiency in lower extremities. Several scabs on the patient's extremities. Not diaphoretic. No pallor.  Psychiatric: Mood, memory and judgment normal.  Vitals reviewed.  LABORATORY  DATA: Lab Results  Component Value Date   WBC 5.4 10/28/2019   HGB 14.3 10/28/2019   HCT 43.4 10/28/2019   MCV 86.1 10/28/2019   PLT 191 10/28/2019      Chemistry      Component Value Date/Time   NA 135 10/14/2019 1239   K 4.2 10/14/2019 1239   CL 108 10/14/2019 1239   CO2 22 10/14/2019 1239   BUN 16 10/14/2019 1239   CREATININE 1.02 10/14/2019 1239      Component Value Date/Time   CALCIUM 9.1 10/14/2019 1239   ALKPHOS 83 10/14/2019 1239   AST 13 (L) 10/14/2019 1239   ALT 15 10/14/2019 1239   BILITOT 0.3 10/14/2019 1239       RADIOGRAPHIC STUDIES:  DG Foot Complete Right  Result Date: 10/17/2019 CLINICAL DATA:  Blunt trauma last night with right second toe pain. EXAM: RIGHT FOOT COMPLETE - 3+ VIEW COMPARISON:  None. FINDINGS: Examination demonstrates diffuse decreased bone mineralization. There is a displaced comminuted fracture of the second distal phalanx. No additional fractures identified. Minimal degenerate change over the first MTP joint. Moderate soft tissue swelling over the dorsum of the mid to forefoot. Partially visualized hardware intact over the distal tibia. IMPRESSION: Displaced comminuted fracture of the second distal phalanx. Electronically Signed   By: Marin Olp M.D.   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 Diagnosed in 2019. 2)stage Ia3(T1cN0M0)non-small cell lung cancer. Diagnosed with a left suprahilar region with AP window adenopathy. Status post SBRT 3)renal cell carcinoma status post CT-guided ablation in July 2020. Followed by Dr. [name]. 4)recurrent non-small cell lung cancer in July 2021. Patient presented with new adenopathy in the AP window. No accessible to biopsy.  The patient is currently undergoing concurrent chemoradiation with carboplatin for an AUC of 2 and  paclitaxel 45 mg/m2. He is status post 3 cycles. His last day of radiation is scheduled for 11/18/19.   Labs were reviewed. Recommend that he proceed with cycle #4 today as scheduled.  I will see him back for a follow up visit in 2 weeks for evaluation before starting cycle #6.   The patient was advised to call immediately if he has any concerning symptoms in the interval. The patient voices understanding of current disease status and treatment options and is in agreement with the current care plan. All questions were answered. The patient knows to call the clinic with any problems, questions or concerns. We can certainly see the patient much sooner if necessary       No orders of the defined types were placed in this encounter.    Andreka Stucki L Jusiah Aguayo, PA-C 10/28/19

## 2019-10-28 ENCOUNTER — Other Ambulatory Visit: Payer: Self-pay

## 2019-10-28 ENCOUNTER — Telehealth: Payer: Self-pay | Admitting: Internal Medicine

## 2019-10-28 ENCOUNTER — Inpatient Hospital Stay: Payer: Medicare Other

## 2019-10-28 ENCOUNTER — Inpatient Hospital Stay (HOSPITAL_BASED_OUTPATIENT_CLINIC_OR_DEPARTMENT_OTHER): Payer: Medicare Other | Admitting: Physician Assistant

## 2019-10-28 ENCOUNTER — Ambulatory Visit
Admission: RE | Admit: 2019-10-28 | Discharge: 2019-10-28 | Disposition: A | Discharge status: Home / Self Care | Payer: Medicare Other | Source: Ambulatory Visit | Attending: Radiation Oncology | Admitting: Radiation Oncology

## 2019-10-28 VITALS — BP 143/55 | HR 78 | Temp 99.1°F | Resp 17 | Ht 74.0 in | Wt 346.0 lb

## 2019-10-28 DIAGNOSIS — C3412 Malignant neoplasm of upper lobe, left bronchus or lung: Secondary | ICD-10-CM | POA: Diagnosis not present

## 2019-10-28 DIAGNOSIS — C3492 Malignant neoplasm of unspecified part of left bronchus or lung: Secondary | ICD-10-CM

## 2019-10-28 DIAGNOSIS — Z510 Encounter for antineoplastic radiation therapy: Secondary | ICD-10-CM | POA: Diagnosis not present

## 2019-10-28 DIAGNOSIS — Z5111 Encounter for antineoplastic chemotherapy: Secondary | ICD-10-CM | POA: Diagnosis not present

## 2019-10-28 LAB — CMP (CANCER CENTER ONLY)
ALT: 14 U/L (ref 0–44)
AST: 14 U/L — ABNORMAL LOW (ref 15–41)
Albumin: 3.3 g/dL — ABNORMAL LOW (ref 3.5–5.0)
Alkaline Phosphatase: 104 U/L (ref 38–126)
Anion gap: 9 (ref 5–15)
BUN: 16 mg/dL (ref 8–23)
CO2: 21 mmol/L — ABNORMAL LOW (ref 22–32)
Calcium: 9.6 mg/dL (ref 8.9–10.3)
Chloride: 105 mmol/L (ref 98–111)
Creatinine: 1.28 mg/dL — ABNORMAL HIGH (ref 0.61–1.24)
GFR, Est AFR Am: >60 mL/min (ref >60)
GFR, Estimated: 58 mL/min — ABNORMAL LOW (ref >60)
Glucose, Bld: 102 mg/dL — ABNORMAL HIGH (ref 70–99)
Potassium: 4.3 mmol/L (ref 3.5–5.1)
Sodium: 135 mmol/L (ref 135–145)
Total Bilirubin: 0.6 mg/dL (ref 0.3–1.2)
Total Protein: 7.3 g/dL (ref 6.5–8.1)

## 2019-10-28 LAB — CBC WITH DIFFERENTIAL (CANCER CENTER ONLY)
Abs Immature Granulocytes: 0.03 10*3/uL (ref 0.00–0.07)
Basophils Absolute: 0 10*3/uL (ref 0.0–0.1)
Basophils Relative: 1 %
Eosinophils Absolute: 0.1 10*3/uL (ref 0.0–0.5)
Eosinophils Relative: 2 %
HCT: 43.4 % (ref 39.0–52.0)
Hemoglobin: 14.3 g/dL (ref 13.0–17.0)
Immature Granulocytes: 1 %
Lymphocytes Relative: 22 %
Lymphs Abs: 1.2 10*3/uL (ref 0.7–4.0)
MCH: 28.4 pg (ref 26.0–34.0)
MCHC: 32.9 g/dL (ref 30.0–36.0)
MCV: 86.1 fL (ref 80.0–100.0)
Monocytes Absolute: 0.6 10*3/uL (ref 0.1–1.0)
Monocytes Relative: 12 %
Neutro Abs: 3.4 10*3/uL (ref 1.7–7.7)
Neutrophils Relative %: 62 %
Platelet Count: 191 10*3/uL (ref 150–400)
RBC: 5.04 MIL/uL (ref 4.22–5.81)
RDW: 18.6 % — ABNORMAL HIGH (ref 11.5–15.5)
WBC Count: 5.4 10*3/uL (ref 4.0–10.5)
nRBC: 0 % (ref 0.0–0.2)

## 2019-10-28 NOTE — Telephone Encounter (Signed)
Scheduled appt per 8/23 sch msg- unable to reach pt . Left message for pt sister

## 2019-10-29 ENCOUNTER — Ambulatory Visit
Admission: RE | Admit: 2019-10-29 | Discharge: 2019-10-29 | Disposition: A | Discharge status: Home / Self Care | Payer: Medicare Other | Source: Ambulatory Visit | Attending: Radiation Oncology | Admitting: Radiation Oncology

## 2019-10-29 DIAGNOSIS — Z510 Encounter for antineoplastic radiation therapy: Secondary | ICD-10-CM | POA: Diagnosis not present

## 2019-10-29 DIAGNOSIS — C3412 Malignant neoplasm of upper lobe, left bronchus or lung: Secondary | ICD-10-CM | POA: Diagnosis not present

## 2019-10-30 ENCOUNTER — Other Ambulatory Visit: Payer: Self-pay

## 2019-10-30 ENCOUNTER — Ambulatory Visit
Admission: RE | Admit: 2019-10-30 | Discharge: 2019-10-30 | Disposition: A | Discharge status: Home / Self Care | Payer: Medicare Other | Source: Ambulatory Visit | Attending: Radiation Oncology | Admitting: Radiation Oncology

## 2019-10-30 ENCOUNTER — Inpatient Hospital Stay: Payer: Medicare Other

## 2019-10-30 VITALS — BP 136/92 | HR 54 | Temp 98.0°F | Resp 18

## 2019-10-30 DIAGNOSIS — Z510 Encounter for antineoplastic radiation therapy: Secondary | ICD-10-CM | POA: Diagnosis not present

## 2019-10-30 DIAGNOSIS — Z5111 Encounter for antineoplastic chemotherapy: Secondary | ICD-10-CM | POA: Diagnosis not present

## 2019-10-30 DIAGNOSIS — C3412 Malignant neoplasm of upper lobe, left bronchus or lung: Secondary | ICD-10-CM | POA: Diagnosis not present

## 2019-10-30 DIAGNOSIS — C3492 Malignant neoplasm of unspecified part of left bronchus or lung: Secondary | ICD-10-CM

## 2019-10-30 MED ORDER — SODIUM CHLORIDE 0.9 % IV SOLN
20.0000 mg | Freq: Once | INTRAVENOUS | Status: AC
  Administered 2019-10-30: 20 mg via INTRAVENOUS
  Filled 2019-10-30: qty 20

## 2019-10-30 MED ORDER — FAMOTIDINE IN NACL 20-0.9 MG/50ML-% IV SOLN
20.0000 mg | Freq: Once | INTRAVENOUS | Status: AC
  Administered 2019-10-30: 20 mg via INTRAVENOUS

## 2019-10-30 MED ORDER — FAMOTIDINE IN NACL 20-0.9 MG/50ML-% IV SOLN
INTRAVENOUS | Status: AC
  Filled 2019-10-30: qty 50

## 2019-10-30 MED ORDER — SODIUM CHLORIDE 0.9 % IV SOLN
45.0000 mg/m2 | Freq: Once | INTRAVENOUS | Status: AC
  Administered 2019-10-30: 120 mg via INTRAVENOUS
  Filled 2019-10-30: qty 20

## 2019-10-30 MED ORDER — DIPHENHYDRAMINE HCL 50 MG/ML IJ SOLN
INTRAMUSCULAR | Status: AC
  Filled 2019-10-30: qty 1

## 2019-10-30 MED ORDER — SODIUM CHLORIDE 0.9 % IV SOLN
300.0000 mg | Freq: Once | INTRAVENOUS | Status: AC
  Administered 2019-10-30: 300 mg via INTRAVENOUS
  Filled 2019-10-30: qty 30

## 2019-10-30 MED ORDER — PALONOSETRON HCL INJECTION 0.25 MG/5ML
0.2500 mg | Freq: Once | INTRAVENOUS | Status: AC
  Administered 2019-10-30: 0.25 mg via INTRAVENOUS

## 2019-10-30 MED ORDER — SODIUM CHLORIDE 0.9 % IV SOLN
Freq: Once | INTRAVENOUS | Status: AC
  Filled 2019-10-30: qty 250

## 2019-10-30 MED ORDER — DIPHENHYDRAMINE HCL 50 MG/ML IJ SOLN
50.0000 mg | Freq: Once | INTRAMUSCULAR | Status: AC
  Administered 2019-10-30: 50 mg via INTRAVENOUS

## 2019-10-30 MED ORDER — PALONOSETRON HCL INJECTION 0.25 MG/5ML
INTRAVENOUS | Status: AC
  Filled 2019-10-30: qty 5

## 2019-10-30 NOTE — Patient Instructions (Signed)
[facility] Discharge Instructions for Patients Receiving Chemotherapy  Today you received the following chemotherapy agents: Pacliatxel and Carboplatin  To help prevent nausea and vomiting after your treatment, we encourage you to take your nausea medication as directed by your MD.   If you develop nausea and vomiting that is not controlled by your nausea medication, call the clinic.   BELOW ARE SYMPTOMS THAT SHOULD BE REPORTED IMMEDIATELY:  *FEVER GREATER THAN 100.5 F  *CHILLS WITH OR WITHOUT FEVER  NAUSEA AND VOMITING THAT IS NOT CONTROLLED WITH YOUR NAUSEA MEDICATION  *UNUSUAL SHORTNESS OF BREATH  *UNUSUAL BRUISING OR BLEEDING  TENDERNESS IN MOUTH AND THROAT WITH OR WITHOUT PRESENCE OF ULCERS  *URINARY PROBLEMS  *BOWEL PROBLEMS  UNUSUAL RASH Items with * indicate a potential emergency and should be followed up as soon as possible.  Feel free to call the clinic should you have any questions or concerns. The clinic phone number is [phone].  Please show the [facility] at check-in to the Emergency Department and triage nurse.

## 2019-10-30 NOTE — Progress Notes (Signed)
During nursing assessment, patient stated his fingers on his right hand have become more numb, patient states "I drop things all the time now".  [name] Heilingoepter, PA made aware. Per [name] PA: Dr. Julien Nordmann does not think it is related to his chemo since he has only had 3 cycles. He has a lot of chronic back issues. Monitor for now. Informed patient of [name]'s response and to let us know if the numbness worsens. Patient verbalized understanding.

## 2019-10-31 ENCOUNTER — Ambulatory Visit
Admission: RE | Admit: 2019-10-31 | Discharge: 2019-10-31 | Disposition: A | Discharge status: Home / Self Care | Payer: Medicare Other | Source: Ambulatory Visit | Attending: Radiation Oncology | Admitting: Radiation Oncology

## 2019-10-31 DIAGNOSIS — C3412 Malignant neoplasm of upper lobe, left bronchus or lung: Secondary | ICD-10-CM | POA: Diagnosis not present

## 2019-10-31 DIAGNOSIS — Z510 Encounter for antineoplastic radiation therapy: Secondary | ICD-10-CM | POA: Diagnosis not present

## 2019-11-01 ENCOUNTER — Ambulatory Visit
Admission: RE | Admit: 2019-11-01 | Discharge: 2019-11-01 | Disposition: A | Discharge status: Home / Self Care | Payer: Medicare Other | Source: Ambulatory Visit | Attending: Radiation Oncology | Admitting: Radiation Oncology

## 2019-11-01 DIAGNOSIS — C3412 Malignant neoplasm of upper lobe, left bronchus or lung: Secondary | ICD-10-CM | POA: Diagnosis not present

## 2019-11-01 DIAGNOSIS — Z510 Encounter for antineoplastic radiation therapy: Secondary | ICD-10-CM | POA: Diagnosis not present

## 2019-11-01 NOTE — Progress Notes (Signed)
[facility] OFFICE PROGRESS NOTE  [name], MD [address] 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 Diagnosed in 2019. 2)stage Ia3(T1cN0M0)non-small cell lung cancer. Diagnosed with a left suprahilar region with AP window adenopathy. Status post SBRT 3)renal cell carcinoma status post CT-guided ablation in July 2020. Followed by Dr. [name]. 4)recurrent non-small cell lung cancer in July 2021. Patient presented with new adenopathy in the AP window.Inaccessible to biopsy  PRIOR THERAPY:  1) SBRT to the LUL nodule in 2019 under the care of Dr. Lisbeth Renshaw 2) SBRT to the left suprahilar mass under the care of Dr. Lisbeth Renshaw in March 2020 3) CT guided ablation to the renal cell carcinoma under the care of Dr. Kathlene Cote.  CURRENT THERAPY: Concurrent chemoradiation with carboplatin for an AUC of 2 and paclitaxel 45 mg/m. First dose on 09/30/2019. Status post 4 cycles.  INTERVAL HISTORY: Reginald Berry 69 y.o. male returns to the clinic today for a follow up visit. He is feeling fairly well today without any concerning complaints. He had another bed bug infestation and is currently in the process of getting this under control with the assistance of our social work team.   Regarding his chemotherapy treatments, he is tolerating it well. He did mention he is having some left sided chest soreness today that is non-exertional. His chest soreness is exacerbated by coughing and by palpation. Denies associated diaphoresis, shortness or breath, nausea/vomiting, light headedness, or palpitations. He denies any recent fevers, chills, night sweats, or weight loss. Denies any nausea, vomiting, or diarrhea. He has frequent and chronic constipation due to his percocet use for his chronic back pain and he takes laxatives as needed. He denies headaches or visual changes. He is here today for  evaluation before starting cycle #5.   MEDICAL HISTORY: Past Medical History:  Diagnosis Date  . Aortic atherosclerosis ([place])   . Arthritis   . Chronic low back pain   . COPD (chronic obstructive pulmonary disease) ([place])   . Coronary artery calcification seen on CT scan    Multivessel  . Essential hypertension   . Gait instability    Previous logging accident  . GERD (gastroesophageal reflux disease)   . Lung cancer ([facility])    Poorly differentiated, XRT  . Renal cell carcinoma ([facility])    Left, status post cryoablation August 2020    ALLERGIES:  has No Known Allergies.  MEDICATIONS:  Current Outpatient Medications  Medication Sig Dispense Refill  . amLODipine (NORVASC) 10 MG tablet Take 10 mg by mouth daily.    Marland Kitchen aspirin 81 MG EC tablet Take 81 mg by mouth daily. Swallow whole.    Marland Kitchen buPROPion (WELLBUTRIN XL) 300 MG 24 hr tablet Take 300 mg by mouth daily.    . busPIRone (BUSPAR) 15 MG tablet Take 15 mg by mouth daily.    . cephALEXin (KEFLEX) 500 MG capsule Take 1 capsule (500 mg total) by mouth 2 (two) times daily. 20 capsule 0  . cetirizine (ZYRTEC) 10 MG tablet Take 10 mg by mouth daily as needed for allergies.    . furosemide (LASIX) 40 MG tablet Take 40 mg by mouth daily.     . hydrochlorothiazide (MICROZIDE) 12.5 MG capsule Take 12.5 mg by mouth daily.    Marland Kitchen liothyronine (CYTOMEL) 25 MCG tablet Take 25 mcg by mouth daily.    Marland Kitchen losartan (COZAAR) 100 MG tablet Take 100  mg by mouth daily.    . Multiple Vitamins-Minerals (CENTRUM SILVER 50+MEN) TABS Take 1 tablet by mouth daily.    Marland Kitchen oxyCODONE-acetaminophen (PERCOCET) 10-325 MG tablet Take 1 tablet by mouth every 4 (four) hours as needed for pain.    . pantoprazole (PROTONIX) 40 MG tablet Take 40 mg by mouth every evening.     . Probiotic Product (PROBIOTIC DAILY PO) Take 1 capsule by mouth daily.    . prochlorperazine (COMPAZINE) 10 MG tablet Take 1 tablet (10 mg total) by mouth every 6 (six) hours as needed. 30 tablet 2  .  rosuvastatin (CRESTOR) 5 MG tablet Take 5 mg by mouth at bedtime.    . sildenafil (VIAGRA) 100 MG tablet Take 100 mg by mouth daily as needed for erectile dysfunction.     No current facility-administered medications for this visit.    SURGICAL HISTORY:  Past Surgical History:  Procedure Laterality Date  . CHOLECYSTECTOMY     2019  . FRACTURE SURGERY     bil legs (logging trees)  . IR RADIOLOGIST EVAL & MGMT  09/27/2018  . IR RADIOLOGIST EVAL & MGMT  11/22/2018  . IR RADIOLOGIST EVAL & MGMT  04/10/2019  . NECK SURGERY  1994-1995  . RADIOLOGY WITH ANESTHESIA Left 10/31/2018   Procedure: CT WITH ANESTHESIA RENAL CRYO ABLATION;  Surgeon: Aletta Edouard, MD;  Location: [facility];  Service: Radiology;  Laterality: Left;    REVIEW OF SYSTEMS:   Review of Systems  Constitutional:Positive for fatigue and generalized weakness.Negative for appetite change, chills, fever and unexpected weight change.  HENT: Negative for mouth sores, nosebleeds, sore throat and trouble swallowing.  Eyes: Negative for eye problems and icterus.  Respiratory:Positive for chronic cough and shortness of breath.Negative for hemoptysis and wheezing.  Cardiovascular:Positive for bilateral lower extremity swelling.Positive for chest soreness. Gastrointestinal: Positive for chronic and frequent constipation. Negative for abdominal pain, diarrhea, nausea and vomiting.  Genitourinary: Negative for bladder incontinence, difficulty urinating, dysuria, frequency and hematuria.  Musculoskeletal:Positive for chronic neck and back pain secondary to an old injury. Skin:Positive for multiple scabs on his extremities. Neurological: Negative for dizziness, extremity weakness, gait problem, headaches, light-headedness and seizures.  Hematological: Negative for adenopathy. Does not bruise/bleed easily.  Psychiatric/Behavioral: Negative for confusion, depression and sleep disturbance. The patient is not nervous/anxious.     PHYSICAL EXAMINATION:  There were no vitals taken for this visit.  ECOG PERFORMANCE STATUS: 2 - Symptomatic, <50% confined to bed  Physical Exam  Constitutional: Oriented to person, place, and time andappears older than stated age.No acute distress. HENT:  Head: Normocephalic and atraumatic.  Mouth/Throat: Oropharynx is clear and moist. No oropharyngeal exudate.  Eyes: Conjunctivae are normal. Right eye exhibits no discharge. Left eye exhibits no discharge. No scleral icterus.  Neck: Normal range of motion. Neck supple.  Cardiovascular: Normal rate, regular rhythm, normal heart sounds and intact distal pulses.  Pulmonary/Chest: Effort normal. Some wheezing. No respiratory distress. No rales.  Abdominal: Soft. Bowel sounds are normal. Exhibits no distension and no mass. There is no tenderness.  Musculoskeletal: Positive for chest soreness with palpation. Bilateral lower extremity edema.normal range of motion.  Lymphadenopathy:  No cervical adenopathy.  Neurological: Alert and oriented to person, place, and time. Exhibits muscle wasting.  Skin: Skin is warm and dry.Chronic venous insufficiency in lower extremities. Several scabs on the patient's extremities. Not diaphoretic. No pallor.  Psychiatric: Mood, memory and judgment normal.  Vitals reviewed.  LABORATORY DATA: Lab Results  Component Value Date   WBC  5.4 10/28/2019   HGB 14.3 10/28/2019   HCT 43.4 10/28/2019   MCV 86.1 10/28/2019   PLT 191 10/28/2019      Chemistry      Component Value Date/Time   NA 135 10/28/2019 0744   K 4.3 10/28/2019 0744   CL 105 10/28/2019 0744   CO2 21 (L) 10/28/2019 0744   BUN 16 10/28/2019 0744   CREATININE 1.28 (H) 10/28/2019 0744      Component Value Date/Time   CALCIUM 9.6 10/28/2019 0744   ALKPHOS 104 10/28/2019 0744   AST 14 (L) 10/28/2019 0744   ALT 14 10/28/2019 0744   BILITOT 0.6 10/28/2019 0744       RADIOGRAPHIC STUDIES:  DG Foot Complete  Right  Result Date: 10/17/2019 CLINICAL DATA:  Blunt trauma last night with right second toe pain. EXAM: RIGHT FOOT COMPLETE - 3+ VIEW COMPARISON:  None. FINDINGS: Examination demonstrates diffuse decreased bone mineralization. There is a displaced comminuted fracture of the second distal phalanx. No additional fractures identified. Minimal degenerate change over the first MTP joint. Moderate soft tissue swelling over the dorsum of the mid to forefoot. Partially visualized hardware intact over the distal tibia. IMPRESSION: Displaced comminuted fracture of the second distal phalanx. Electronically Signed   By: Marin Olp M.D.   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 Diagnosed in 2019. 2)stage Ia3(T1cN0M0)non-small cell lung cancer. Diagnosed with a left suprahilar region with AP window adenopathy. Status post SBRT 3)renal cell carcinoma status post CT-guided ablation in July 2020. Followed by Dr. [name]. 4)recurrent non-small cell lung cancer in July 2021. Patient presented with new adenopathy in the AP window.No accessible to biopsy.  The patient is currently undergoing concurrent chemoradiation with carboplatin for an AUC of 2 and paclitaxel 45 mg/m2. He is status post 4 cycles. His last day of radiation is scheduled for 11/18/19.   Labs were reviewed. Recommend that heproceed with cycle #5 today as scheduled.  I will see him back for a follow up visit in 2 weeks for evaluation before starting cycle #8.  His chest soreness is exacerbated by palpation and is likely muscular in nature. There is no associated shortness of breath, n/v, light headedness, radiation to the jaw or arm. No respiratory distress, tachycardia, or hypoxia. Discussed he may use tylenol. He also has a prescription for percocet for chronic back  pain. As long as he has not taken tylenol prior to coming to his appointment today, we can give him tylenol 325 mg in the infusion room today.   The patient was advised to call immediately if he has any concerning symptoms in the interval. The patient voices understanding of current disease status and treatment options and is in agreement with the current care plan. All questions were answered. The patient knows to call the clinic with any problems, questions or concerns. We can certainly see the patient much sooner if necessary     No orders of the defined types were placed in this encounter.    Kenidi Elenbaas L Baya Lentz, PA-C 11/01/19

## 2019-11-04 ENCOUNTER — Other Ambulatory Visit: Payer: Self-pay

## 2019-11-04 ENCOUNTER — Inpatient Hospital Stay (HOSPITAL_BASED_OUTPATIENT_CLINIC_OR_DEPARTMENT_OTHER): Payer: Medicare Other | Admitting: Physician Assistant

## 2019-11-04 ENCOUNTER — Encounter: Payer: Self-pay | Admitting: *Deleted

## 2019-11-04 ENCOUNTER — Other Ambulatory Visit: Payer: Self-pay | Admitting: Physician Assistant

## 2019-11-04 ENCOUNTER — Inpatient Hospital Stay: Payer: Medicare Other

## 2019-11-04 ENCOUNTER — Inpatient Hospital Stay: Payer: Medicare Other | Admitting: Internal Medicine

## 2019-11-04 ENCOUNTER — Ambulatory Visit
Admission: RE | Admit: 2019-11-04 | Discharge: 2019-11-04 | Disposition: A | Discharge status: Home / Self Care | Payer: Medicare Other | Source: Ambulatory Visit | Attending: Radiation Oncology | Admitting: Radiation Oncology

## 2019-11-04 VITALS — BP 116/60 | HR 87 | Temp 98.2°F | Resp 18

## 2019-11-04 DIAGNOSIS — C3412 Malignant neoplasm of upper lobe, left bronchus or lung: Secondary | ICD-10-CM | POA: Diagnosis not present

## 2019-11-04 DIAGNOSIS — Z5111 Encounter for antineoplastic chemotherapy: Secondary | ICD-10-CM | POA: Diagnosis not present

## 2019-11-04 DIAGNOSIS — C3492 Malignant neoplasm of unspecified part of left bronchus or lung: Secondary | ICD-10-CM

## 2019-11-04 DIAGNOSIS — Z510 Encounter for antineoplastic radiation therapy: Secondary | ICD-10-CM | POA: Diagnosis not present

## 2019-11-04 LAB — CMP (CANCER CENTER ONLY)
ALT: 13 U/L (ref 0–44)
AST: 11 U/L — ABNORMAL LOW (ref 15–41)
Albumin: 3.2 g/dL — ABNORMAL LOW (ref 3.5–5.0)
Alkaline Phosphatase: 98 U/L (ref 38–126)
Anion gap: 6 (ref 5–15)
BUN: 25 mg/dL — ABNORMAL HIGH (ref 8–23)
CO2: 23 mmol/L (ref 22–32)
Calcium: 9.5 mg/dL (ref 8.9–10.3)
Chloride: 106 mmol/L (ref 98–111)
Creatinine: 1.12 mg/dL (ref 0.61–1.24)
GFR, Est AFR Am: >60 mL/min (ref >60)
GFR, Estimated: >60 mL/min (ref >60)
Glucose, Bld: 128 mg/dL — ABNORMAL HIGH (ref 70–99)
Potassium: 4.2 mmol/L (ref 3.5–5.1)
Sodium: 135 mmol/L (ref 135–145)
Total Bilirubin: 0.8 mg/dL (ref 0.3–1.2)
Total Protein: 6.8 g/dL (ref 6.5–8.1)

## 2019-11-04 LAB — CBC WITH DIFFERENTIAL (CANCER CENTER ONLY)
Abs Immature Granulocytes: 0.03 10*3/uL (ref 0.00–0.07)
Basophils Absolute: 0 10*3/uL (ref 0.0–0.1)
Basophils Relative: 0 %
Eosinophils Absolute: 0.1 10*3/uL (ref 0.0–0.5)
Eosinophils Relative: 2 %
HCT: 40.1 % (ref 39.0–52.0)
Hemoglobin: 13.4 g/dL (ref 13.0–17.0)
Immature Granulocytes: 1 %
Lymphocytes Relative: 22 %
Lymphs Abs: 1.1 10*3/uL (ref 0.7–4.0)
MCH: 28.6 pg (ref 26.0–34.0)
MCHC: 33.4 g/dL (ref 30.0–36.0)
MCV: 85.7 fL (ref 80.0–100.0)
Monocytes Absolute: 0.3 10*3/uL (ref 0.1–1.0)
Monocytes Relative: 6 %
Neutro Abs: 3.4 10*3/uL (ref 1.7–7.7)
Neutrophils Relative %: 69 %
Platelet Count: 204 10*3/uL (ref 150–400)
RBC: 4.68 MIL/uL (ref 4.22–5.81)
RDW: 18.9 % — ABNORMAL HIGH (ref 11.5–15.5)
WBC Count: 4.9 10*3/uL (ref 4.0–10.5)
nRBC: 0 % (ref 0.0–0.2)

## 2019-11-04 MED ORDER — PALONOSETRON HCL INJECTION 0.25 MG/5ML
0.2500 mg | Freq: Once | INTRAVENOUS | Status: AC
  Administered 2019-11-04: 0.25 mg via INTRAVENOUS

## 2019-11-04 MED ORDER — FAMOTIDINE IN NACL 20-0.9 MG/50ML-% IV SOLN
INTRAVENOUS | Status: AC
  Filled 2019-11-04: qty 50

## 2019-11-04 MED ORDER — FAMOTIDINE IN NACL 20-0.9 MG/50ML-% IV SOLN
20.0000 mg | Freq: Once | INTRAVENOUS | Status: AC
  Administered 2019-11-04: 20 mg via INTRAVENOUS

## 2019-11-04 MED ORDER — SODIUM CHLORIDE 0.9 % IV SOLN
20.0000 mg | Freq: Once | INTRAVENOUS | Status: AC
  Administered 2019-11-04: 20 mg via INTRAVENOUS
  Filled 2019-11-04: qty 20

## 2019-11-04 MED ORDER — SODIUM CHLORIDE 0.9 % IV SOLN
Freq: Once | INTRAVENOUS | Status: AC
  Filled 2019-11-04: qty 250

## 2019-11-04 MED ORDER — SODIUM CHLORIDE 0.9 % IV SOLN
300.0000 mg | Freq: Once | INTRAVENOUS | Status: AC
  Administered 2019-11-04: 300 mg via INTRAVENOUS
  Filled 2019-11-04: qty 30

## 2019-11-04 MED ORDER — PALONOSETRON HCL INJECTION 0.25 MG/5ML
INTRAVENOUS | Status: AC
  Filled 2019-11-04: qty 5

## 2019-11-04 MED ORDER — DIPHENHYDRAMINE HCL 50 MG/ML IJ SOLN
50.0000 mg | Freq: Once | INTRAMUSCULAR | Status: AC
  Administered 2019-11-04: 50 mg via INTRAVENOUS

## 2019-11-04 MED ORDER — SODIUM CHLORIDE 0.9 % IV SOLN
45.0000 mg/m2 | Freq: Once | INTRAVENOUS | Status: AC
  Administered 2019-11-04: 120 mg via INTRAVENOUS
  Filled 2019-11-04: qty 20

## 2019-11-04 NOTE — Progress Notes (Signed)
Last chemo given 5 days ago.  Ok to treat today per Fritz Pickerel, PA via Dr Julien Nordmann.

## 2019-11-04 NOTE — Patient Instructions (Signed)
[facility] Discharge Instructions for Patients Receiving Chemotherapy  Today you received the following chemotherapy agents: Pacliatxel and Carboplatin  To help prevent nausea and vomiting after your treatment, we encourage you to take your nausea medication as directed by your MD.   If you develop nausea and vomiting that is not controlled by your nausea medication, call the clinic.   BELOW ARE SYMPTOMS THAT SHOULD BE REPORTED IMMEDIATELY:  *FEVER GREATER THAN 100.5 F  *CHILLS WITH OR WITHOUT FEVER  NAUSEA AND VOMITING THAT IS NOT CONTROLLED WITH YOUR NAUSEA MEDICATION  *UNUSUAL SHORTNESS OF BREATH  *UNUSUAL BRUISING OR BLEEDING  TENDERNESS IN MOUTH AND THROAT WITH OR WITHOUT PRESENCE OF ULCERS  *URINARY PROBLEMS  *BOWEL PROBLEMS  UNUSUAL RASH Items with * indicate a potential emergency and should be followed up as soon as possible.  Feel free to call the clinic should you have any questions or concerns. The clinic phone number is [phone].  Please show the [facility] at check-in to the Emergency Department and triage nurse.

## 2019-11-05 ENCOUNTER — Ambulatory Visit
Admission: RE | Admit: 2019-11-05 | Discharge: 2019-11-05 | Disposition: A | Discharge status: Home / Self Care | Payer: Medicare Other | Source: Ambulatory Visit | Attending: Radiation Oncology | Admitting: Radiation Oncology

## 2019-11-05 ENCOUNTER — Telehealth: Payer: Self-pay | Admitting: Physician Assistant

## 2019-11-05 DIAGNOSIS — I10 Essential (primary) hypertension: Secondary | ICD-10-CM | POA: Diagnosis not present

## 2019-11-05 DIAGNOSIS — Z510 Encounter for antineoplastic radiation therapy: Secondary | ICD-10-CM | POA: Diagnosis not present

## 2019-11-05 DIAGNOSIS — C3412 Malignant neoplasm of upper lobe, left bronchus or lung: Secondary | ICD-10-CM | POA: Diagnosis not present

## 2019-11-05 DIAGNOSIS — M5137 Other intervertebral disc degeneration, lumbosacral region: Secondary | ICD-10-CM | POA: Diagnosis not present

## 2019-11-05 DIAGNOSIS — E7849 Other hyperlipidemia: Secondary | ICD-10-CM | POA: Diagnosis not present

## 2019-11-05 DIAGNOSIS — J449 Chronic obstructive pulmonary disease, unspecified: Secondary | ICD-10-CM | POA: Diagnosis not present

## 2019-11-05 NOTE — Telephone Encounter (Signed)
Scheduled per los. Called and spoke with patient. Confirmed appt 

## 2019-11-06 ENCOUNTER — Ambulatory Visit
Admission: RE | Admit: 2019-11-06 | Discharge: 2019-11-06 | Disposition: A | Discharge status: Home / Self Care | Payer: Medicare Other | Source: Ambulatory Visit | Attending: Radiation Oncology | Admitting: Radiation Oncology

## 2019-11-06 DIAGNOSIS — Z510 Encounter for antineoplastic radiation therapy: Secondary | ICD-10-CM | POA: Insufficient documentation

## 2019-11-06 DIAGNOSIS — C3412 Malignant neoplasm of upper lobe, left bronchus or lung: Secondary | ICD-10-CM | POA: Diagnosis not present

## 2019-11-07 ENCOUNTER — Ambulatory Visit
Admission: RE | Admit: 2019-11-07 | Discharge: 2019-11-07 | Disposition: A | Discharge status: Home / Self Care | Payer: Medicare Other | Source: Ambulatory Visit | Attending: Radiation Oncology | Admitting: Radiation Oncology

## 2019-11-07 DIAGNOSIS — Z510 Encounter for antineoplastic radiation therapy: Secondary | ICD-10-CM | POA: Diagnosis not present

## 2019-11-07 DIAGNOSIS — C3412 Malignant neoplasm of upper lobe, left bronchus or lung: Secondary | ICD-10-CM | POA: Diagnosis not present

## 2019-11-08 ENCOUNTER — Ambulatory Visit
Admission: RE | Admit: 2019-11-08 | Discharge: 2019-11-08 | Disposition: A | Discharge status: Home / Self Care | Payer: Medicare Other | Source: Ambulatory Visit | Attending: Radiation Oncology | Admitting: Radiation Oncology

## 2019-11-08 DIAGNOSIS — Z510 Encounter for antineoplastic radiation therapy: Secondary | ICD-10-CM | POA: Diagnosis not present

## 2019-11-08 DIAGNOSIS — C3412 Malignant neoplasm of upper lobe, left bronchus or lung: Secondary | ICD-10-CM | POA: Diagnosis not present

## 2019-11-12 ENCOUNTER — Other Ambulatory Visit: Payer: Self-pay

## 2019-11-12 ENCOUNTER — Ambulatory Visit
Admission: RE | Admit: 2019-11-12 | Discharge: 2019-11-12 | Disposition: A | Discharge status: Home / Self Care | Payer: Medicare Other | Source: Ambulatory Visit | Attending: Radiation Oncology | Admitting: Radiation Oncology

## 2019-11-12 ENCOUNTER — Other Ambulatory Visit: Payer: Self-pay | Admitting: Physician Assistant

## 2019-11-12 ENCOUNTER — Inpatient Hospital Stay: Payer: Medicare Other | Attending: Physician Assistant

## 2019-11-12 ENCOUNTER — Ambulatory Visit: Payer: Medicare Other

## 2019-11-12 ENCOUNTER — Inpatient Hospital Stay: Payer: Medicare Other

## 2019-11-12 VITALS — BP 135/72 | HR 90 | Temp 97.9°F | Resp 20

## 2019-11-12 DIAGNOSIS — C3412 Malignant neoplasm of upper lobe, left bronchus or lung: Secondary | ICD-10-CM

## 2019-11-12 DIAGNOSIS — Z5111 Encounter for antineoplastic chemotherapy: Secondary | ICD-10-CM | POA: Insufficient documentation

## 2019-11-12 DIAGNOSIS — Z510 Encounter for antineoplastic radiation therapy: Secondary | ICD-10-CM | POA: Diagnosis not present

## 2019-11-12 DIAGNOSIS — C3492 Malignant neoplasm of unspecified part of left bronchus or lung: Secondary | ICD-10-CM

## 2019-11-12 LAB — CMP (CANCER CENTER ONLY)
ALT: 15 U/L (ref 0–44)
AST: 13 U/L — ABNORMAL LOW (ref 15–41)
Albumin: 3.3 g/dL — ABNORMAL LOW (ref 3.5–5.0)
Alkaline Phosphatase: 96 U/L (ref 38–126)
Anion gap: 8 (ref 5–15)
BUN: 20 mg/dL (ref 8–23)
CO2: 22 mmol/L (ref 22–32)
Calcium: 8.9 mg/dL (ref 8.9–10.3)
Chloride: 105 mmol/L (ref 98–111)
Creatinine: 1.12 mg/dL (ref 0.61–1.24)
GFR, Est AFR Am: >60 mL/min (ref >60)
GFR, Estimated: >60 mL/min (ref >60)
Glucose, Bld: 121 mg/dL — ABNORMAL HIGH (ref 70–99)
Potassium: 4.2 mmol/L (ref 3.5–5.1)
Sodium: 135 mmol/L (ref 135–145)
Total Bilirubin: 0.5 mg/dL (ref 0.3–1.2)
Total Protein: 7.1 g/dL (ref 6.5–8.1)

## 2019-11-12 LAB — CBC WITH DIFFERENTIAL (CANCER CENTER ONLY)
Abs Immature Granulocytes: 0.02 10*3/uL (ref 0.00–0.07)
Basophils Absolute: 0 10*3/uL (ref 0.0–0.1)
Basophils Relative: 1 %
Eosinophils Absolute: 0 10*3/uL (ref 0.0–0.5)
Eosinophils Relative: 1 %
HCT: 39.3 % (ref 39.0–52.0)
Hemoglobin: 13.2 g/dL (ref 13.0–17.0)
Immature Granulocytes: 1 %
Lymphocytes Relative: 20 %
Lymphs Abs: 0.9 10*3/uL (ref 0.7–4.0)
MCH: 29.5 pg (ref 26.0–34.0)
MCHC: 33.6 g/dL (ref 30.0–36.0)
MCV: 87.9 fL (ref 80.0–100.0)
Monocytes Absolute: 0.4 10*3/uL (ref 0.1–1.0)
Monocytes Relative: 8 %
Neutro Abs: 3 10*3/uL (ref 1.7–7.7)
Neutrophils Relative %: 69 %
Platelet Count: 253 10*3/uL (ref 150–400)
RBC: 4.47 MIL/uL (ref 4.22–5.81)
RDW: 19.9 % — ABNORMAL HIGH (ref 11.5–15.5)
WBC Count: 4.3 10*3/uL (ref 4.0–10.5)
nRBC: 0 % (ref 0.0–0.2)

## 2019-11-12 MED ORDER — FAMOTIDINE IN NACL 20-0.9 MG/50ML-% IV SOLN
20.0000 mg | Freq: Once | INTRAVENOUS | Status: AC
  Administered 2019-11-12: 20 mg via INTRAVENOUS

## 2019-11-12 MED ORDER — PALONOSETRON HCL INJECTION 0.25 MG/5ML
0.2500 mg | Freq: Once | INTRAVENOUS | Status: AC
  Administered 2019-11-12: 0.25 mg via INTRAVENOUS

## 2019-11-12 MED ORDER — DIPHENHYDRAMINE HCL 50 MG/ML IJ SOLN
INTRAMUSCULAR | Status: AC
  Filled 2019-11-12: qty 1

## 2019-11-12 MED ORDER — SODIUM CHLORIDE 0.9 % IV SOLN
300.0000 mg | Freq: Once | INTRAVENOUS | Status: AC
  Administered 2019-11-12: 300 mg via INTRAVENOUS
  Filled 2019-11-12: qty 30

## 2019-11-12 MED ORDER — DIPHENHYDRAMINE HCL 50 MG/ML IJ SOLN
50.0000 mg | Freq: Once | INTRAMUSCULAR | Status: AC
  Administered 2019-11-12: 50 mg via INTRAVENOUS

## 2019-11-12 MED ORDER — SODIUM CHLORIDE 0.9 % IV SOLN
20.0000 mg | Freq: Once | INTRAVENOUS | Status: AC
  Administered 2019-11-12: 20 mg via INTRAVENOUS
  Filled 2019-11-12: qty 20

## 2019-11-12 MED ORDER — FAMOTIDINE IN NACL 20-0.9 MG/50ML-% IV SOLN
INTRAVENOUS | Status: AC
  Filled 2019-11-12: qty 50

## 2019-11-12 MED ORDER — SODIUM CHLORIDE 0.9 % IV SOLN
45.0000 mg/m2 | Freq: Once | INTRAVENOUS | Status: AC
  Administered 2019-11-12: 120 mg via INTRAVENOUS
  Filled 2019-11-12: qty 20

## 2019-11-12 MED ORDER — PALONOSETRON HCL INJECTION 0.25 MG/5ML
INTRAVENOUS | Status: AC
  Filled 2019-11-12: qty 5

## 2019-11-12 MED ORDER — SODIUM CHLORIDE 0.9 % IV SOLN
Freq: Once | INTRAVENOUS | Status: AC
  Filled 2019-11-12: qty 250

## 2019-11-12 NOTE — Patient Instructions (Signed)
   [facility] Discharge Instructions for Patients Receiving Chemotherapy  Today you received the following chemotherapy agents Taxol and Carboplatin   To help prevent nausea and vomiting after your treatment, we encourage you to take your nausea medication as directed.    If you develop nausea and vomiting that is not controlled by your nausea medication, call the clinic.   BELOW ARE SYMPTOMS THAT SHOULD BE REPORTED IMMEDIATELY:  *FEVER GREATER THAN 100.5 F  *CHILLS WITH OR WITHOUT FEVER  NAUSEA AND VOMITING THAT IS NOT CONTROLLED WITH YOUR NAUSEA MEDICATION  *UNUSUAL SHORTNESS OF BREATH  *UNUSUAL BRUISING OR BLEEDING  TENDERNESS IN MOUTH AND THROAT WITH OR WITHOUT PRESENCE OF ULCERS  *URINARY PROBLEMS  *BOWEL PROBLEMS  UNUSUAL RASH Items with * indicate a potential emergency and should be followed up as soon as possible.  Feel free to call the clinic should you have any questions or concerns. The clinic phone number is [phone].  Please show the CHEMO ALERT CARD at check-in to the Emergency Department and triage nurse.   

## 2019-11-13 ENCOUNTER — Ambulatory Visit: Payer: Medicare Other

## 2019-11-13 ENCOUNTER — Ambulatory Visit
Admission: RE | Admit: 2019-11-13 | Discharge: 2019-11-13 | Disposition: A | Discharge status: Home / Self Care | Payer: Medicare Other | Source: Ambulatory Visit | Attending: Radiation Oncology | Admitting: Radiation Oncology

## 2019-11-13 ENCOUNTER — Encounter: Payer: Self-pay | Admitting: Radiation Oncology

## 2019-11-13 DIAGNOSIS — C3412 Malignant neoplasm of upper lobe, left bronchus or lung: Secondary | ICD-10-CM | POA: Diagnosis not present

## 2019-11-13 DIAGNOSIS — Z510 Encounter for antineoplastic radiation therapy: Secondary | ICD-10-CM | POA: Diagnosis not present

## 2019-11-14 ENCOUNTER — Telehealth: Payer: Self-pay | Admitting: Medical Oncology

## 2019-11-14 ENCOUNTER — Ambulatory Visit: Payer: Medicare Other

## 2019-11-14 NOTE — Telephone Encounter (Signed)
Reviewed appts with [name].  Bed Bugs-She said [employer] sprayed a week and half ago. She said they are" probably coming from his brother who stays with Herbie [place] every night".  Sister is asking for personal assistant /aide to come several times a week for a few hours.

## 2019-11-15 ENCOUNTER — Ambulatory Visit: Payer: Medicare Other

## 2019-11-15 ENCOUNTER — Telehealth: Payer: Self-pay | Admitting: *Deleted

## 2019-11-15 NOTE — Telephone Encounter (Signed)
Received message from [name] to cancel appt for 9/14.  Called sister & informed not to come for that appt.   Schedule message sent to cancel & r/s in 3-4 wks with CT scan.

## 2019-11-18 ENCOUNTER — Telehealth: Payer: Self-pay | Admitting: Physician Assistant

## 2019-11-18 ENCOUNTER — Ambulatory Visit: Payer: Medicare Other

## 2019-11-18 DIAGNOSIS — I10 Essential (primary) hypertension: Secondary | ICD-10-CM | POA: Diagnosis not present

## 2019-11-18 DIAGNOSIS — R4582 Worries: Secondary | ICD-10-CM | POA: Diagnosis not present

## 2019-11-18 DIAGNOSIS — J301 Allergic rhinitis due to pollen: Secondary | ICD-10-CM | POA: Diagnosis not present

## 2019-11-18 DIAGNOSIS — C649 Malignant neoplasm of unspecified kidney, except renal pelvis: Secondary | ICD-10-CM | POA: Diagnosis not present

## 2019-11-18 DIAGNOSIS — F17210 Nicotine dependence, cigarettes, uncomplicated: Secondary | ICD-10-CM | POA: Diagnosis not present

## 2019-11-18 DIAGNOSIS — J449 Chronic obstructive pulmonary disease, unspecified: Secondary | ICD-10-CM | POA: Diagnosis not present

## 2019-11-18 DIAGNOSIS — C3490 Malignant neoplasm of unspecified part of unspecified bronchus or lung: Secondary | ICD-10-CM | POA: Diagnosis not present

## 2019-11-18 NOTE — Telephone Encounter (Signed)
Called pt sister - no answer. Left message for patient with appt date and time

## 2019-11-19 ENCOUNTER — Encounter: Payer: Self-pay | Admitting: *Deleted

## 2019-11-19 ENCOUNTER — Ambulatory Visit: Payer: Medicare Other

## 2019-11-19 ENCOUNTER — Other Ambulatory Visit: Payer: Medicare Other

## 2019-11-19 ENCOUNTER — Ambulatory Visit: Payer: Medicare Other | Admitting: Physician Assistant

## 2019-11-19 ENCOUNTER — Telehealth: Payer: Self-pay | Admitting: Medical Oncology

## 2019-11-19 NOTE — Telephone Encounter (Signed)
PCS attestation form given to [facility] for signature to see if pt qualifies for an aide to help him at home. When complete fax to [employer] at [phone].

## 2019-11-19 NOTE — Progress Notes (Signed)
[place] Work  Clinical Social Work message from Rn that patient and patients sister have requested personal care services (PCS).  CSW initiated PCS paper work and gave to RN to complete medical needs/dx information.  RN will complete and fax PCS request.    Johnnye Lana, MSW, LCSW, OSW-C Clinical Social Worker [facility] [phone]

## 2019-11-22 ENCOUNTER — Telehealth: Payer: Self-pay | Admitting: General Practice

## 2019-11-22 NOTE — Telephone Encounter (Signed)
[place] CSW Progress Notes  [employer] [facility] [place] to verify receipt of Personal Care Services/Medicaid form.  They have received form, however the boxes "impacts ADLs" were not checked.  They request these be checked and form refaxed.  CSW Dalene Seltzer and Abelina Bachelor RN informed.  Edwyna Shell, LCSW Clinical Social Worker Phone:  [phone]

## 2019-11-25 ENCOUNTER — Telehealth: Payer: Self-pay | Admitting: Medical Oncology

## 2019-11-25 NOTE — Telephone Encounter (Signed)
Faxed PCS from with added information /dx re ADL's

## 2019-11-29 NOTE — Progress Notes (Signed)
  Radiation Oncology         [phone] ________________________________  Name: Reginald Berry MRN: [id number]  Date: 11/13/2019  DOB: 01/24/1951  End of Treatment Note  Diagnosis:  Lung cancer     Indication for treatment::  curative       Radiation treatment dates:   10/01/2019 through 11/13/2019   Site/dose:   The patient was treated to the disease within the left mediastinum to a dose of 60 Gy using a 4 field, 3-D conformal technique.    Narrative: The patient tolerated radiation treatment relatively well.    Plan: The patient has completed radiation treatment. The patient will return to radiation oncology clinic for routine followup in one month. I advised the patient to call or return sooner if they have any questions or concerns related to their recovery or treatment. ________________________________  Jodelle Gross, M.D., Ph.D.

## 2019-12-04 ENCOUNTER — Ambulatory Visit (HOSPITAL_COMMUNITY)
Admission: RE | Admit: 2019-12-04 | Discharge: 2019-12-04 | Disposition: A | Discharge status: Home / Self Care | Payer: Medicare Other | Source: Ambulatory Visit | Attending: Physician Assistant | Admitting: Physician Assistant

## 2019-12-04 ENCOUNTER — Encounter (HOSPITAL_COMMUNITY): Payer: Self-pay

## 2019-12-04 ENCOUNTER — Other Ambulatory Visit: Payer: Self-pay

## 2019-12-04 DIAGNOSIS — J432 Centrilobular emphysema: Secondary | ICD-10-CM | POA: Diagnosis not present

## 2019-12-04 DIAGNOSIS — C3412 Malignant neoplasm of upper lobe, left bronchus or lung: Secondary | ICD-10-CM | POA: Diagnosis not present

## 2019-12-04 DIAGNOSIS — I700 Atherosclerosis of aorta: Secondary | ICD-10-CM | POA: Diagnosis not present

## 2019-12-04 DIAGNOSIS — C3490 Malignant neoplasm of unspecified part of unspecified bronchus or lung: Secondary | ICD-10-CM | POA: Diagnosis not present

## 2019-12-04 DIAGNOSIS — J984 Other disorders of lung: Secondary | ICD-10-CM | POA: Diagnosis not present

## 2019-12-04 MED ORDER — IOHEXOL 300 MG/ML  SOLN
75.0000 mL | Freq: Once | INTRAMUSCULAR | Status: AC | PRN
  Administered 2019-12-04: 75 mL via INTRAVENOUS

## 2019-12-09 ENCOUNTER — Other Ambulatory Visit: Payer: Self-pay | Admitting: Physician Assistant

## 2019-12-09 DIAGNOSIS — C3492 Malignant neoplasm of unspecified part of left bronchus or lung: Secondary | ICD-10-CM

## 2019-12-10 ENCOUNTER — Encounter: Payer: Self-pay | Admitting: Internal Medicine

## 2019-12-10 ENCOUNTER — Inpatient Hospital Stay: Payer: Medicare Other | Attending: Physician Assistant

## 2019-12-10 ENCOUNTER — Other Ambulatory Visit: Payer: Self-pay

## 2019-12-10 ENCOUNTER — Inpatient Hospital Stay: Payer: Medicare Other

## 2019-12-10 ENCOUNTER — Inpatient Hospital Stay (HOSPITAL_BASED_OUTPATIENT_CLINIC_OR_DEPARTMENT_OTHER): Payer: Medicare Other | Admitting: Internal Medicine

## 2019-12-10 VITALS — BP 123/57 | HR 79 | Temp 97.8°F | Resp 20 | Ht 74.0 in | Wt 346.0 lb

## 2019-12-10 DIAGNOSIS — C3492 Malignant neoplasm of unspecified part of left bronchus or lung: Secondary | ICD-10-CM

## 2019-12-10 DIAGNOSIS — Z79899 Other long term (current) drug therapy: Secondary | ICD-10-CM | POA: Diagnosis not present

## 2019-12-10 DIAGNOSIS — C3412 Malignant neoplasm of upper lobe, left bronchus or lung: Secondary | ICD-10-CM | POA: Insufficient documentation

## 2019-12-10 DIAGNOSIS — Z5112 Encounter for antineoplastic immunotherapy: Secondary | ICD-10-CM

## 2019-12-10 DIAGNOSIS — Z7189 Other specified counseling: Secondary | ICD-10-CM | POA: Diagnosis not present

## 2019-12-10 LAB — CMP (CANCER CENTER ONLY)
ALT: 21 U/L (ref 0–44)
AST: 18 U/L (ref 15–41)
Albumin: 2.7 g/dL — ABNORMAL LOW (ref 3.5–5.0)
Alkaline Phosphatase: 94 U/L (ref 38–126)
Anion gap: 6 (ref 5–15)
BUN: 16 mg/dL (ref 8–23)
CO2: 26 mmol/L (ref 22–32)
Calcium: 9.7 mg/dL (ref 8.9–10.3)
Chloride: 103 mmol/L (ref 98–111)
Creatinine: 1.18 mg/dL (ref 0.61–1.24)
GFR, Estimated: >60 mL/min (ref >60)
Glucose, Bld: 100 mg/dL — ABNORMAL HIGH (ref 70–99)
Potassium: 4.2 mmol/L (ref 3.5–5.1)
Sodium: 135 mmol/L (ref 135–145)
Total Bilirubin: 0.5 mg/dL (ref 0.3–1.2)
Total Protein: 7.4 g/dL (ref 6.5–8.1)

## 2019-12-10 LAB — CBC WITH DIFFERENTIAL (CANCER CENTER ONLY)
Abs Immature Granulocytes: 0.07 10*3/uL (ref 0.00–0.07)
Basophils Absolute: 0 10*3/uL (ref 0.0–0.1)
Basophils Relative: 1 %
Eosinophils Absolute: 0.1 10*3/uL (ref 0.0–0.5)
Eosinophils Relative: 1 %
HCT: 35.7 % — ABNORMAL LOW (ref 39.0–52.0)
Hemoglobin: 11.8 g/dL — ABNORMAL LOW (ref 13.0–17.0)
Immature Granulocytes: 1 %
Lymphocytes Relative: 19 %
Lymphs Abs: 1.2 10*3/uL (ref 0.7–4.0)
MCH: 30.3 pg (ref 26.0–34.0)
MCHC: 33.1 g/dL (ref 30.0–36.0)
MCV: 91.8 fL (ref 80.0–100.0)
Monocytes Absolute: 0.6 10*3/uL (ref 0.1–1.0)
Monocytes Relative: 9 %
Neutro Abs: 4.5 10*3/uL (ref 1.7–7.7)
Neutrophils Relative %: 69 %
Platelet Count: 304 10*3/uL (ref 150–400)
RBC: 3.89 MIL/uL — ABNORMAL LOW (ref 4.22–5.81)
RDW: 21.6 % — ABNORMAL HIGH (ref 11.5–15.5)
WBC Count: 6.5 10*3/uL (ref 4.0–10.5)
nRBC: 0 % (ref 0.0–0.2)

## 2019-12-10 NOTE — Patient Instructions (Signed)
-  We covered a lot of important information at your appointment today regarding what the treatment plan is moving forward. Here are the the main points that were discussed at your office visit with [place] today:  -The treatment will consist of a new medication. This is not chemotherapy. This new drug is a type of Immunotherapy called Imfinzi (Durvalumab).  -We are planning on starting your treatment next week on ~12/17/19 -Your treatment will be given once every 4 weeks. You will receive this treatment every four weeks for a total of 1 year (13 total treatments) unless you experience unacceptable toxicity or if there is evidence on your routine CT scans that the cancer is growing  -We will get a CT scan after every 3 treatments to check on the progress of treatment  Side Effects:  -The adverse effect of the immunotherapy including but not limited to immunotherapy mediated skin rash, diarrhea, inflammation of the lung, kidney, liver, thyroid or other endocrine dysfunction  Follow up:  -We will see you back for a follow up visit in 5 weeks when you come in for your second treatment.

## 2019-12-10 NOTE — Progress Notes (Signed)
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 Diagnosed in 2019. 2)stage Ia3(T1cN0M0)non-small cell lung cancer. Diagnosed with a left suprahilar region with AP window adenopathy. Status post SBRT 3)renal cell carcinoma status post CT-guided ablation in July 2020. Followed by Dr. [name]. 4)recurrent non-small cell lung cancer in July 2021. Patient presented with new adenopathy in the AP window.Inaccessible to biopsy  PRIOR THERAPY:  1) SBRT to the LUL nodule in 2019 under the care of Dr. Lisbeth Renshaw 2) SBRT to the left suprahilar mass under the care of Dr. Lisbeth Renshaw in March 2020 3) CT guided ablation to the renal cell carcinoma under the care of Dr. Kathlene Cote. 40 Concurrent chemoradiation with carboplatin for an AUC of 2 and paclitaxel 45 mg/m. First dose on 09/30/2019. Status post6cycles.  Last cycle was given on 11/12/2019 with partial response.  CURRENT THERAPY:  Consolidation treatment with immunotherapy with Imfinzi 1500 mg IV every 4 weeks.  First dose December 18, 2019.   INTERVAL HISTORY: Reginald Berry 69 y.o. male returns to the clinic today for follow-up visit accompanied by his wife.  The patient is feeling fine today with no concerning complaints except for mild fatigue.  He tolerated the previous course of concurrent chemoradiation fairly well.  He denied having any current chest pain, shortness of breath, cough or hemoptysis.  He denied having any fever or chills.  He has no nausea, vomiting, diarrhea or constipation.  He denied having any significant weight loss or night sweats.  He had repeat CT scan of the chest performed recently and he is here for evaluation and discussion of his scan results.  MEDICAL HISTORY: Past Medical History:   Diagnosis Date  . Aortic atherosclerosis ([place])   . Arthritis   . Chronic low back pain   . COPD (chronic obstructive pulmonary disease) ([place])   . Coronary artery calcification seen on CT scan    Multivessel  . Essential hypertension   . Gait instability    Previous logging accident  . GERD (gastroesophageal reflux disease)   . Lung cancer ([place])    Poorly differentiated, XRT  . Renal cell carcinoma ([facility])    Left, status post cryoablation August 2020    ALLERGIES:  has No Known Allergies.  MEDICATIONS:  Current Outpatient Medications  Medication Sig Dispense Refill  . amLODipine (NORVASC) 10 MG tablet Take 10 mg by mouth daily.    Marland Kitchen aspirin 81 MG EC tablet Take 81 mg by mouth daily. Swallow whole.    Marland Kitchen buPROPion (WELLBUTRIN XL) 300 MG 24 hr tablet Take 300 mg by mouth daily.    . busPIRone (BUSPAR) 15 MG tablet Take 15 mg by mouth daily.    . cephALEXin (KEFLEX) 500 MG capsule Take 1 capsule (500 mg total) by mouth 2 (two) times daily. 20 capsule 0  . cetirizine (ZYRTEC) 10 MG tablet Take 10 mg by mouth daily as needed for allergies.    . furosemide (LASIX) 40 MG tablet Take 40 mg by mouth daily.     . hydrochlorothiazide (MICROZIDE) 12.5 MG capsule Take 12.5 mg by mouth daily.    Marland Kitchen liothyronine (CYTOMEL) 25 MCG tablet Take 25 mcg by mouth daily.    Marland Kitchen losartan (COZAAR) 100 MG tablet Take 100 mg by mouth daily.    Marland Kitchen  Multiple Vitamins-Minerals (CENTRUM SILVER 50+MEN) TABS Take 1 tablet by mouth daily.    Marland Kitchen oxyCODONE-acetaminophen (PERCOCET) 10-325 MG tablet Take 1 tablet by mouth every 4 (four) hours as needed for pain.    . pantoprazole (PROTONIX) 40 MG tablet Take 40 mg by mouth every evening.     . Probiotic Product (PROBIOTIC DAILY PO) Take 1 capsule by mouth daily.    . prochlorperazine (COMPAZINE) 10 MG tablet Take 1 tablet (10 mg total) by mouth every 6 (six) hours as needed. 30 tablet 2  . rosuvastatin (CRESTOR) 5 MG tablet Take 5 mg by mouth at bedtime.    . sildenafil  (VIAGRA) 100 MG tablet Take 100 mg by mouth daily as needed for erectile dysfunction.     No current facility-administered medications for this visit.    SURGICAL HISTORY:  Past Surgical History:  Procedure Laterality Date  . CHOLECYSTECTOMY     2019  . FRACTURE SURGERY     bil legs (logging trees)  . IR RADIOLOGIST EVAL & MGMT  09/27/2018  . IR RADIOLOGIST EVAL & MGMT  11/22/2018  . IR RADIOLOGIST EVAL & MGMT  04/10/2019  . NECK SURGERY  1994-1995  . RADIOLOGY WITH ANESTHESIA Left 10/31/2018   Procedure: CT WITH ANESTHESIA RENAL CRYO ABLATION;  Surgeon: Aletta Edouard, MD;  Location: [facility];  Service: Radiology;  Laterality: Left;    REVIEW OF SYSTEMS:  Constitutional: positive for fatigue Eyes: negative Ears, nose, mouth, throat, and face: negative Respiratory: positive for dyspnea on exertion Cardiovascular: negative Gastrointestinal: negative Genitourinary:negative Integument/breast: negative Hematologic/lymphatic: negative Musculoskeletal:negative Neurological: negative Behavioral/Psych: negative Endocrine: negative Allergic/Immunologic: negative   PHYSICAL EXAMINATION: General appearance: alert, cooperative, fatigued and no distress Head: Normocephalic, without obvious abnormality, atraumatic Neck: no adenopathy, no JVD, supple, symmetrical, trachea midline and thyroid not enlarged, symmetric, no tenderness/mass/nodules Lymph nodes: Cervical, supraclavicular, and axillary nodes normal. Resp: clear to auscultation bilaterally Back: symmetric, no curvature. ROM normal. No CVA tenderness. Cardio: regular rate and rhythm, S1, S2 normal, no murmur, click, rub or gallop GI: soft, non-tender; bowel sounds normal; no masses,  no organomegaly Extremities: extremities normal, atraumatic, no cyanosis or edema Neurologic: Alert and oriented X 3, normal strength and tone. Normal symmetric reflexes. Normal coordination and gait  ECOG PERFORMANCE STATUS: 1 - Symptomatic but  completely ambulatory  Blood pressure (!) 123/57, pulse 79, temperature 97.8 F (36.6 C), temperature source Tympanic, resp. rate 20, height 6\' 2"  (1.88 m), SpO2 100 %.  LABORATORY DATA: Lab Results  Component Value Date   WBC 6.5 12/10/2019   HGB 11.8 (L) 12/10/2019   HCT 35.7 (L) 12/10/2019   MCV 91.8 12/10/2019   PLT 304 12/10/2019      Chemistry      Component Value Date/Time   NA 135 11/12/2019 0812   K 4.2 11/12/2019 0812   CL 105 11/12/2019 0812   CO2 22 11/12/2019 0812   BUN 20 11/12/2019 0812   CREATININE 1.12 11/12/2019 0812      Component Value Date/Time   CALCIUM 8.9 11/12/2019 0812   ALKPHOS 96 11/12/2019 0812   AST 13 (L) 11/12/2019 0812   ALT 15 11/12/2019 0812   BILITOT 0.5 11/12/2019 0812       RADIOGRAPHIC STUDIES: CT Chest W Contrast  Result Date: 12/04/2019 CLINICAL DATA:  Recurrent lung cancer, chemotherapy and XRT complete. Chest pain, cough, shortness of breath. EXAM: CT CHEST WITH CONTRAST TECHNIQUE: Multidetector CT imaging of the chest was performed during intravenous contrast administration. CONTRAST:  41mL  OMNIPAQUE IOHEXOL 300 MG/ML  SOLN COMPARISON:  PET-CT dated 09/17/2019.  CT chest dated 09/10/2019. FINDINGS: Cardiovascular: Heart is normal in size.  No pericardial effusion. No evidence of thoracic aortic aneurysm. Atherosclerotic calcifications of the aortic arch. Three vessel coronary atherosclerosis. Mild prominence of the main pulmonary artery. Mediastinum/Nodes: 7 mm short axis prevascular node (series 2/image 82), previously 14 mm. Otherwise, no suspicious mediastinal lymphadenopathy. Right thyroid is mildly enlarged/heterogeneous, unchanged and previously biopsied on ultrasound. This has been evaluated on previous imaging. (ref: J Am Coll Radiol. 2015 Feb;12(2): 143-50). Lungs/Pleura: Scarring/radiation changes in the lateral left upper lobe and suprahilar region. No suspicious pulmonary nodules. Mild centrilobular and paraseptal  emphysematous changes, upper lung predominant. No focal consolidation. No pleural effusion or pneumothorax. Upper Abdomen: Visualized upper abdomen is notable for stable nodular thickening of the bilateral adrenal glands, left greater than right, without hypermetabolism on prior PET, favoring benign adrenal adenomas. Prior cholecystectomy. Vascular calcifications. Musculoskeletal: Degenerative changes of the visualized thoracolumbar spine. IMPRESSION: Improving prevascular nodal metastasis, now measuring 7 mm short axis. Otherwise, no findings suspicious for recurrent or metastatic disease. Scarring/radiation changes in the left upper lobe and suprahilar region. Aortic Atherosclerosis (ICD10-I70.0) and Emphysema (ICD10-J43.9). Electronically Signed   By: Julian Hy M.D.   On: 12/04/2019 17:00    ASSESSMENT AND PLAN: This is a very pleasant 69 years old white male with recurrent non-small cell lung cancer presenting as a stage IIIa with mediastinal lymphadenopathy in July 2021.  The patient has a history of early stage non-small cell lung cancer, adenosquamous carcinoma as stage Ib of the left upper lobe status post SBRT in 2019.  He also has a stage Ia3 non-small cell lung cancer status post SBRT to the left suprahilar region and AP window adenopathy. He completed a course of concurrent chemoradiation with weekly carboplatin and paclitaxel status post 6 cycles and tolerated his treatment well. He had repeat CT scan of the chest performed recently.  I personally and independently reviewed the scans and discussed the results with the patient and his wife. His scan showed improvement of the prevascular nodal metastasis. I gave the patient the option of continuous observation and close monitoring versus proceeding with consolidation treatment with immunotherapy with Imfinzi 1500 mg IV every 4 weeks.  I discussed with the patient the adverse effect of the immunotherapy including but not limited to  immunotherapy mediated skin rash, diarrhea, inflammation of the lung, kidney, liver, thyroid or other endocrine dysfunction. The patient is interested in the immunotherapy and he is expected to start the first cycle of this treatment next week. He will come back for follow-up visit in 5 weeks for evaluation before starting cycle #2. The patient was advised to call immediately if he has any concerning symptoms in the interval. The patient voices understanding of current disease status and treatment options and is in agreement with the current care plan.  All questions were answered. The patient knows to call the clinic with any problems, questions or concerns. We can certainly see the patient much sooner if necessary.   Disclaimer: This note was dictated with voice recognition software. Similar sounding words can inadvertently be transcribed and may not be corrected upon review.

## 2019-12-10 NOTE — Progress Notes (Signed)
DISCONTINUE ON PATHWAY REGIMEN - Non-Small Cell Lung     Administer weekly:     Paclitaxel      Carboplatin   **Always confirm dose/schedule in your pharmacy ordering system**  REASON: Continuation Of Treatment PRIOR TREATMENT: [id number]: Carboplatin AUC=2 + Paclitaxel 45 mg/m2 Weekly During Radiation TREATMENT RESPONSE: Partial Response (PR)  START ON PATHWAY REGIMEN - Non-Small Cell Lung     A cycle is every 28 days:     Durvalumab   **Always confirm dose/schedule in your pharmacy ordering system**  Patient Characteristics: Preoperative or Nonsurgical Candidate (Clinical Staging), Stage III - Nonsurgical Candidate (Nonsquamous and Squamous), PS = 0, 1 Therapeutic Status: Preoperative or Nonsurgical Candidate (Clinical Staging) AJCC T Category: cT1b AJCC N Category: cN2 AJCC M Category: cM0 AJCC 8 Stage Grouping: IIIA ECOG Performance Status: 1 Intent of Therapy: Curative Intent, Discussed with Patient

## 2019-12-11 ENCOUNTER — Telehealth: Payer: Self-pay | Admitting: Radiation Oncology

## 2019-12-11 NOTE — Telephone Encounter (Signed)
  Radiation Oncology         [phone] ________________________________  Name: Reginald Berry MRN: [id number]  Date of Service: 12/11/2019  DOB: April 12, 1950  Post Treatment Telephone Note  Diagnosis:   Recurrent Metastatic Stage IB, cT2aN0M0 NSCLC, adenosquamous carcinoma of the LUL s/p SBRT, and putative Stage IA3, T1cN0M0putative NSCLClung cancer of the left suprahilar region with new AP window adenopathy.   Interval Since Last Radiation: 4 weeks    10/01/2019 through 11/13/2019: The patient was treated to the disease within the left mediastinum to a dose of 60 Gy using a 4 field, 3-D conformal technique.    06/26/2018-07/06/2018 SBRT Treatment: The tumor in the left suprahilar lung region was treated to 60Gy in 5 fractions at 12 Gy per fraction  08/30/2017-07/05/2019SBRT Treatment: The tumor in the [place] treated with a course of stereotactic body radiation treatment. The patient received 60Gy in16fractions at 12Gyper fraction.    Narrative:  The patient was contacted today for routine follow-up. During treatment he did very well with radiotherapy and did not have significant desquamation. His care was delayed due to bedbugs and his home was fumigated by [employer]. He was able to complete his treatment. He reports he is having some numbness in his fingers since chemotherapy but otherwise he's doing well. He denies any skin concerns or any swallowing difficulties.  Impression/Plan: 1.   Recurrent Metastatic Stage IB, cT2aN0M0 NSCLC, adenosquamous carcinoma of the LUL s/p SBRT, and putative Stage IA3, T1cN0M0putative NSCLClung cancer of the left suprahilar region with new AP window adenopathy. The patient has been doing well since completion of radiotherapy. We discussed that we would be happy to continue to follow him as needed, but he will also continue to follow up with Dr. Julien Nordmann in medical oncology. 2.         Left Chest wall pain. The patient has never had a fracture from  prior radiotherapy but neuralgia has been managed with gabapentin and he also takes this for peripheral neuropathy so his gabapentin is managed by his PCP. 3.         Left renal cell carcinoma. The patient will continue to follow up with Dr. [name] and Dr. Kathlene Cote.     Carola Rhine, PAC

## 2019-12-12 ENCOUNTER — Telehealth: Payer: Self-pay | Admitting: Internal Medicine

## 2019-12-12 NOTE — Telephone Encounter (Signed)
Scheduled appt per 10/5 los - called both mobile and home number. No answer. Left message on mobile number with apt date and time

## 2019-12-18 ENCOUNTER — Inpatient Hospital Stay: Payer: Medicare Other

## 2019-12-18 ENCOUNTER — Other Ambulatory Visit: Payer: Self-pay

## 2019-12-18 VITALS — BP 136/68 | HR 82 | Temp 98.2°F | Resp 18

## 2019-12-18 DIAGNOSIS — C3492 Malignant neoplasm of unspecified part of left bronchus or lung: Secondary | ICD-10-CM

## 2019-12-18 DIAGNOSIS — Z79899 Other long term (current) drug therapy: Secondary | ICD-10-CM | POA: Diagnosis not present

## 2019-12-18 DIAGNOSIS — Z5112 Encounter for antineoplastic immunotherapy: Secondary | ICD-10-CM | POA: Diagnosis not present

## 2019-12-18 DIAGNOSIS — C3412 Malignant neoplasm of upper lobe, left bronchus or lung: Secondary | ICD-10-CM | POA: Diagnosis not present

## 2019-12-18 LAB — CMP (CANCER CENTER ONLY)
ALT: 12 U/L (ref 0–44)
AST: 15 U/L (ref 15–41)
Albumin: 3 g/dL — ABNORMAL LOW (ref 3.5–5.0)
Alkaline Phosphatase: 109 U/L (ref 38–126)
Anion gap: 8 (ref 5–15)
BUN: 19 mg/dL (ref 8–23)
CO2: 23 mmol/L (ref 22–32)
Calcium: 9.4 mg/dL (ref 8.9–10.3)
Chloride: 106 mmol/L (ref 98–111)
Creatinine: 1.13 mg/dL (ref 0.61–1.24)
GFR, Estimated: >60 mL/min (ref >60)
Glucose, Bld: 116 mg/dL — ABNORMAL HIGH (ref 70–99)
Potassium: 4.6 mmol/L (ref 3.5–5.1)
Sodium: 137 mmol/L (ref 135–145)
Total Bilirubin: 0.5 mg/dL (ref 0.3–1.2)
Total Protein: 7.6 g/dL (ref 6.5–8.1)

## 2019-12-18 LAB — CBC WITH DIFFERENTIAL (CANCER CENTER ONLY)
Abs Immature Granulocytes: 0.03 10*3/uL (ref 0.00–0.07)
Basophils Absolute: 0 10*3/uL (ref 0.0–0.1)
Basophils Relative: 1 %
Eosinophils Absolute: 0.1 10*3/uL (ref 0.0–0.5)
Eosinophils Relative: 2 %
HCT: 39.4 % (ref 39.0–52.0)
Hemoglobin: 13 g/dL (ref 13.0–17.0)
Immature Granulocytes: 1 %
Lymphocytes Relative: 24 %
Lymphs Abs: 1.4 10*3/uL (ref 0.7–4.0)
MCH: 30.7 pg (ref 26.0–34.0)
MCHC: 33 g/dL (ref 30.0–36.0)
MCV: 92.9 fL (ref 80.0–100.0)
Monocytes Absolute: 0.6 10*3/uL (ref 0.1–1.0)
Monocytes Relative: 11 %
Neutro Abs: 3.5 10*3/uL (ref 1.7–7.7)
Neutrophils Relative %: 61 %
Platelet Count: 347 10*3/uL (ref 150–400)
RBC: 4.24 MIL/uL (ref 4.22–5.81)
RDW: 20.7 % — ABNORMAL HIGH (ref 11.5–15.5)
WBC Count: 5.6 10*3/uL (ref 4.0–10.5)
nRBC: 0 % (ref 0.0–0.2)

## 2019-12-18 LAB — TSH: TSH: 1.701 u[IU]/mL (ref 0.320–4.118)

## 2019-12-18 MED ORDER — SODIUM CHLORIDE 0.9 % IV SOLN
Freq: Once | INTRAVENOUS | Status: AC
  Filled 2019-12-18: qty 250

## 2019-12-18 MED ORDER — SODIUM CHLORIDE 0.9 % IV SOLN
1500.0000 mg | Freq: Once | INTRAVENOUS | Status: AC
  Administered 2019-12-18: 1500 mg via INTRAVENOUS
  Filled 2019-12-18: qty 30

## 2019-12-18 NOTE — Patient Instructions (Signed)
[facility] Discharge Instructions for Patients Receiving Chemotherapy  Today you received the following chemotherapy agents: durvalumab (imfinzi).  To help prevent nausea and vomiting after your treatment, we encourage you to take your nausea medication as directed.   If you develop nausea and vomiting that is not controlled by your nausea medication, call the clinic.   BELOW ARE SYMPTOMS THAT SHOULD BE REPORTED IMMEDIATELY:  *FEVER GREATER THAN 100.5 F  *CHILLS WITH OR WITHOUT FEVER  NAUSEA AND VOMITING THAT IS NOT CONTROLLED WITH YOUR NAUSEA MEDICATION  *UNUSUAL SHORTNESS OF BREATH  *UNUSUAL BRUISING OR BLEEDING  TENDERNESS IN MOUTH AND THROAT WITH OR WITHOUT PRESENCE OF ULCERS  *URINARY PROBLEMS  *BOWEL PROBLEMS  UNUSUAL RASH Items with * indicate a potential emergency and should be followed up as soon as possible.  Feel free to call the clinic should you have any questions or concerns. The clinic phone number is [phone].  Please show the [facility] at check-in to the Emergency Department and triage nurse.  Durvalumab injection What is this medicine? DURVALUMAB (dur VAL ue mab) is a monoclonal antibody. It is used to treat urothelial cancer and lung cancer. This medicine may be used for other purposes; ask your health care provider or pharmacist if you have questions. COMMON BRAND NAME(S): IMFINZI What should I tell my health care provider before I take this medicine? They need to know if you have any of these conditions: diabetes immune system problems infection inflammatory bowel disease kidney disease liver disease lung or breathing disease lupus organ transplant stomach or intestine problems thyroid disease an unusual or allergic reaction to durvalumab, other medicines, foods, dyes, or preservatives pregnant or trying to get pregnant breast-feeding How should I use this medicine? This medicine is for infusion into a vein. It  is given by a health care professional in a hospital or clinic setting. A special MedGuide will be given to you before each treatment. Be sure to read this information carefully each time. Talk to your pediatrician regarding the use of this medicine in children. Special care may be needed. Overdosage: If you think you have taken too much of this medicine contact a poison control center or emergency room at once. NOTE: This medicine is only for you. Do not share this medicine with others. What if I miss a dose? It is important not to miss your dose. Call your doctor or health care professional if you are unable to keep an appointment. What may interact with this medicine? Interactions have not been studied. This list may not describe all possible interactions. Give your health care provider a list of all the medicines, herbs, non-prescription drugs, or dietary supplements you use. Also tell them if you smoke, drink alcohol, or use illegal drugs. Some items may interact with your medicine. What should I watch for while using this medicine? This drug may make you feel generally unwell. Continue your course of treatment even though you feel ill unless your doctor tells you to stop. You may need blood work done while you are taking this medicine. Do not become pregnant while taking this medicine or for 3 months after stopping it. Women should inform their doctor if they wish to become pregnant or think they might be pregnant. There is a potential for serious side effects to an unborn child. Talk to your health care professional or pharmacist for more information. Do not breast-feed an infant while taking this medicine or for 3 months after stopping it.  What side effects may I notice from receiving this medicine? Side effects that you should report to your doctor or health care professional as soon as possible: allergic reactions like skin rash, itching or hives, swelling of the face, lips, or  tongue black, tarry stools bloody or watery diarrhea breathing problems change in emotions or moods change in sex drive changes in vision chest pain or chest tightness chills confusion cough facial flushing fever headache signs and symptoms of high blood sugar such as dizziness; dry mouth; dry skin; fruity breath; nausea; stomach pain; increased hunger or thirst; increased urination signs and symptoms of liver injury like dark yellow or brown urine; general ill feeling or flu-like symptoms; light-colored stools; loss of appetite; nausea; right upper belly pain; unusually weak or tired; yellowing of the eyes or skin stomach pain trouble passing urine or change in the amount of urine weight gain or weight loss Side effects that usually do not require medical attention (report these to your doctor or health care professional if they continue or are bothersome): bone pain constipation loss of appetite muscle pain nausea swelling of the ankles, feet, hands tiredness This list may not describe all possible side effects. Call your doctor for medical advice about side effects. You may report side effects to FDA at 1-800-FDA-1088. Where should I keep my medicine? This drug is given in a hospital or clinic and will not be stored at home. NOTE: This sheet is a summary. It may not cover all possible information. If you have questions about this medicine, talk to your doctor, pharmacist, or health care provider.  2020 Elsevier/Gold Standard (2016-05-03 19:25:04)

## 2019-12-25 ENCOUNTER — Encounter: Payer: Self-pay | Admitting: *Deleted

## 2019-12-25 NOTE — Progress Notes (Signed)
Oncology Nurse Navigator Documentation  Oncology Nurse Navigator Flowsheets 12/25/2019  Abnormal Finding Date -  Diagnosis Status Treatment Based on Clinical Diagnosis  Planned Course of Treatment Chemo/Radiation Concurrent;Targeted Therapy  Phase of Treatment Targeted Therapy  Chemotherapy Actual Start Date: 09/30/2019  Chemotherapy Actual End Date: 11/12/2019  Radiation Actual Start Date: 10/01/2019  Radiation Actual End Date: 11/13/2019  Targeted Therapy Actual Start Date: 12/18/2019  Navigator Follow Up Date: 12/25/2019  Navigator Follow Up Reason: Appointment Review;Review Note  Navigation Complete Date: 12/25/2019  Post Navigation: Continue to Follow Patient? No  Reason Not Navigating Patient: Patient On Maintenance Chemotherapy  Navigator Location [facility]  Navigator Encounter Type Other:  Treatment Initiated Date 09/30/2019  Treatment Phase Treatment  Barriers/Navigation Needs Coordination of Care  Interventions Coordination of Care  Acuity Level 2-Minimal Needs (1-2 Barriers Identified)  Coordination of Care Other  Time Spent with Patient 30

## 2019-12-26 ENCOUNTER — Ambulatory Visit: Payer: Medicare Other | Admitting: Cardiology

## 2019-12-26 NOTE — Progress Notes (Deleted)
Cardiology Office Note  Date: 12/26/2019   ID: Reginald Berry, Reginald Berry [birth year], MRN [id number]  PCP:  [name], MD  Cardiologist:  Rozann Lesches, MD Electrophysiologist:  None   No chief complaint on file.   History of Present Illness: Reginald Berry is a 69 y.o. male seen in consultation back in March.  Echocardiogram from April revealed LVEF 65 to 70%, normal RV contraction, no major valvular abnormalities.  Past Medical History:  Diagnosis Date  . Aortic atherosclerosis ([place])   . Arthritis   . Chronic low back pain   . COPD (chronic obstructive pulmonary disease) ([place])   . Coronary artery calcification seen on CT scan    Multivessel  . Essential hypertension   . Gait instability    Previous logging accident  . GERD (gastroesophageal reflux disease)   . Lung cancer ([place])    Poorly differentiated, XRT  . Renal cell carcinoma ([place])    Left, status post cryoablation August 2020    Past Surgical History:  Procedure Laterality Date  . CHOLECYSTECTOMY     2019  . FRACTURE SURGERY     bil legs (logging trees)  . IR RADIOLOGIST EVAL & MGMT  09/27/2018  . IR RADIOLOGIST EVAL & MGMT  11/22/2018  . IR RADIOLOGIST EVAL & MGMT  04/10/2019  . NECK SURGERY  1994-1995  . RADIOLOGY WITH ANESTHESIA Left 10/31/2018   Procedure: CT WITH ANESTHESIA RENAL CRYO ABLATION;  Surgeon: Aletta Edouard, MD;  Location: [facility];  Service: Radiology;  Laterality: Left;    Current Outpatient Medications  Medication Sig Dispense Refill  . amLODipine (NORVASC) 10 MG tablet Take 10 mg by mouth daily.    Marland Kitchen aspirin 81 MG EC tablet Take 81 mg by mouth daily. Swallow whole.    Marland Kitchen buPROPion (WELLBUTRIN XL) 300 MG 24 hr tablet Take 300 mg by mouth daily.    . busPIRone (BUSPAR) 15 MG tablet Take 15 mg by mouth daily.    . cephALEXin (KEFLEX) 500 MG capsule Take 1 capsule (500 mg total) by mouth 2 (two) times daily. 20 capsule 0  . cetirizine (ZYRTEC) 10 MG tablet Take 10 mg by mouth daily  as needed for allergies.    . furosemide (LASIX) 40 MG tablet Take 40 mg by mouth daily.     . hydrochlorothiazide (MICROZIDE) 12.5 MG capsule Take 12.5 mg by mouth daily.    Marland Kitchen liothyronine (CYTOMEL) 25 MCG tablet Take 25 mcg by mouth daily.    Marland Kitchen losartan (COZAAR) 100 MG tablet Take 100 mg by mouth daily.    . Multiple Vitamins-Minerals (CENTRUM SILVER 50+MEN) TABS Take 1 tablet by mouth daily.    Marland Kitchen oxyCODONE-acetaminophen (PERCOCET) 10-325 MG tablet Take 1 tablet by mouth every 4 (four) hours as needed for pain.    . pantoprazole (PROTONIX) 40 MG tablet Take 40 mg by mouth every evening.     . Probiotic Product (PROBIOTIC DAILY PO) Take 1 capsule by mouth daily.    . prochlorperazine (COMPAZINE) 10 MG tablet Take 1 tablet (10 mg total) by mouth every 6 (six) hours as needed. 30 tablet 2  . rosuvastatin (CRESTOR) 5 MG tablet Take 5 mg by mouth at bedtime.    . sildenafil (VIAGRA) 100 MG tablet Take 100 mg by mouth daily as needed for erectile dysfunction.     No current facility-administered medications for this visit.   Allergies:  Patient has no known allergies.   Social History: The patient  reports  that he has been smoking. He has been smoking about 0.50 packs per day. He has never used smokeless tobacco. He reports that he does not drink alcohol and does not use drugs.   Family History: The patient's family history includes Diabetes in his sister; Heart attack in his father and mother; Heart disease in his brother; Heart failure in his father; Renal Disease in his mother.   ROS:  Please see the history of present illness. Otherwise, complete review of systems is positive for {NONE DEFAULTED:18576::"none"}.  All other systems are reviewed and negative.   Physical Exam: VS:  There were no vitals taken for this visit., BMI There is no height or weight on file to calculate BMI.  Wt Readings from Last 3 Encounters:  12/10/19 (!) 346 lb (156.9 kg)  10/28/19 (!) 346 lb (156.9 kg)  05/29/19  (!) 315 lb (142.9 kg)    General: Patient appears comfortable at rest. HEENT: Conjunctiva and lids normal, oropharynx clear with moist mucosa. Neck: Supple, no elevated JVP or carotid bruits, no thyromegaly. Lungs: Clear to auscultation, nonlabored breathing at rest. Cardiac: Regular rate and rhythm, no S3 or significant systolic murmur, no pericardial rub. Abdomen: Soft, nontender, no hepatomegaly, bowel sounds present, no guarding or rebound. Extremities: No pitting edema, distal pulses 2+. Skin: Warm and dry. Musculoskeletal: No kyphosis. Neuropsychiatric: Alert and oriented x3, affect grossly appropriate.  ECG:  An ECG dated 05/29/2019 was personally reviewed today and demonstrated:  Normal sinus rhythm.  Recent Labwork: 12/18/2019: ALT 12; AST 15; BUN 19; Creatinine 1.13; Hemoglobin 13.0; Platelet Count 347; Potassium 4.6; Sodium 137; TSH 1.701  March 2021: BUN 20, creatinine 1.43, potassium 4.6, AST 20, ALT 15, cholesterol 131, triglycerides 144, HDL 40, LDL 66  Other Studies Reviewed Today:  Echocardiogram 06/20/2019: 1. Left ventricular ejection fraction, by estimation, is 65 to 70%. The  left ventricle has hyperdynamic function. Left ventricular endocardial  border not optimally defined to evaluate regional wall motion. Left  ventricular diastolic parameters were  normal.  2. Right ventricular systolic function is normal. The right ventricular  size is normal. There is normal pulmonary artery systolic pressure.  3. Left atrial size was mildly dilated.  4. The mitral valve is normal in structure. No evidence of mitral valve  regurgitation. No evidence of mitral stenosis.  5. The aortic valve has an indeterminant number of cusps. Aortic valve  regurgitation is not visualized. No aortic stenosis is present.  6. The inferior vena cava is normal in size with greater than 50%  respiratory variability, suggesting right atrial pressure of 3 mmHg.  Assessment and  Plan:    Medication Adjustments/Labs and Tests Ordered: Current medicines are reviewed at length with the patient today.  Concerns regarding medicines are outlined above.   Tests Ordered: No orders of the defined types were placed in this encounter.   Medication Changes: No orders of the defined types were placed in this encounter.   Disposition:  Follow up {follow up:15908}  Signed, Satira Sark, MD, [facility] 12/26/2019 11:50 AM    [facility] [place], [place],  [address] Phone: [phone]; Fax: [phone]

## 2020-01-04 DIAGNOSIS — Z720 Tobacco use: Secondary | ICD-10-CM | POA: Diagnosis not present

## 2020-01-04 DIAGNOSIS — E7849 Other hyperlipidemia: Secondary | ICD-10-CM | POA: Diagnosis not present

## 2020-01-04 DIAGNOSIS — I10 Essential (primary) hypertension: Secondary | ICD-10-CM | POA: Diagnosis not present

## 2020-01-04 DIAGNOSIS — M5137 Other intervertebral disc degeneration, lumbosacral region: Secondary | ICD-10-CM | POA: Diagnosis not present

## 2020-01-04 DIAGNOSIS — J449 Chronic obstructive pulmonary disease, unspecified: Secondary | ICD-10-CM | POA: Diagnosis not present

## 2020-01-07 NOTE — Progress Notes (Signed)
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 Diagnosed in 2019. 2)stage Ia3(T1cN0M0)non-small cell lung cancer. Diagnosed with a left suprahilar region with AP window adenopathy. Status post SBRT 3)renal cell carcinoma status post CT-guided ablation in July 2020. Followed by Dr. [name]. 4)recurrent non-small cell lung cancer in July 2021. Patient presented with new adenopathy in the AP window.Inaccessible to biopsy  PRIOR THERAPY: 1) SBRT to the LUL nodule in 2019 under the care of Dr. Lisbeth Renshaw 2) SBRT to the left suprahilar mass under the care of Dr. Lisbeth Renshaw in March 2020 3) CT guided ablation to the renal cell carcinoma under the care of Dr. Kathlene Cote. 4) Concurrent chemoradiation with carboplatin for an AUC of 2 and paclitaxel 45 mg/m. Last cycle given on 11/12/19.Status post6cycles. He had a partial response to treatment  CURRENT THERAPY: Consolidation treatment with immunotherapy with Imfinzi 1500 mg IV every 4 weeks.  First dose December 18, 2019.  INTERVAL HISTORY: Reginald Berry 69 y.o. male returns to the clinic today for a follow-up visit.  The patient is feeling well today without any concerning complaints. The patient is currently undergoing consolidation immunotherapy with Imfinzi.  He is status post his first cycle and he tolerated it well without any adverse side effects.  The patient denies any fever, chills, night sweats, or weight loss.  The patient reports his baseline dyspnea on exertion and chronic cough which produces clear phlegm. The patient denies any chest pain or hemoptysis.  He denies any nausea, vomiting, or diarrhea. Sometimes he experiences constipation. He denies any headache or visual changes.  He denies any rashes or skin changes.  The patient is here today for evaluation before  starting cycle #2.   MEDICAL HISTORY: Past Medical History:  Diagnosis Date  . Aortic atherosclerosis ([place])   . Arthritis   . Chronic low back pain   . COPD (chronic obstructive pulmonary disease) ([place])   . Coronary artery calcification seen on CT scan    Multivessel  . Essential hypertension   . Gait instability    Previous logging accident  . GERD (gastroesophageal reflux disease)   . Lung cancer ([place])    Poorly differentiated, XRT  . Renal cell carcinoma ([facility])    Left, status post cryoablation August 2020    ALLERGIES:  has No Known Allergies.  MEDICATIONS:  Current Outpatient Medications  Medication Sig Dispense Refill  . amLODipine (NORVASC) 10 MG tablet Take 10 mg by mouth daily.    Marland Kitchen aspirin 81 MG EC tablet Take 81 mg by mouth daily. Swallow whole.    Marland Kitchen buPROPion (WELLBUTRIN XL) 300 MG 24 hr tablet Take 300 mg by mouth daily.    . busPIRone (BUSPAR) 15 MG tablet Take 15 mg by mouth daily.    . cephALEXin (KEFLEX) 500 MG capsule Take 1 capsule (500 mg total) by mouth 2 (two) times daily. 20 capsule 0  . cetirizine (ZYRTEC) 10 MG tablet Take 10 mg by mouth daily as needed for allergies.    . furosemide (LASIX) 40 MG tablet Take 40 mg by mouth daily.     . hydrochlorothiazide (MICROZIDE) 12.5 MG capsule Take 12.5 mg by mouth daily.    Marland Kitchen liothyronine (CYTOMEL) 25 MCG tablet Take 25 mcg by mouth daily.    Marland Kitchen losartan (COZAAR) 100 MG tablet Take 100 mg by  mouth daily.    . Multiple Vitamins-Minerals (CENTRUM SILVER 50+MEN) TABS Take 1 tablet by mouth daily.    Marland Kitchen oxyCODONE-acetaminophen (PERCOCET) 10-325 MG tablet Take 1 tablet by mouth every 4 (four) hours as needed for pain.    . pantoprazole (PROTONIX) 40 MG tablet Take 40 mg by mouth every evening.     . Probiotic Product (PROBIOTIC DAILY PO) Take 1 capsule by mouth daily.    . prochlorperazine (COMPAZINE) 10 MG tablet Take 1 tablet (10 mg total) by mouth every 6 (six) hours as needed. 30 tablet 2  . rosuvastatin  (CRESTOR) 5 MG tablet Take 5 mg by mouth at bedtime.    . sildenafil (VIAGRA) 100 MG tablet Take 100 mg by mouth daily as needed for erectile dysfunction.     No current facility-administered medications for this visit.    SURGICAL HISTORY:  Past Surgical History:  Procedure Laterality Date  . CHOLECYSTECTOMY     2019  . FRACTURE SURGERY     bil legs (logging trees)  . IR RADIOLOGIST EVAL & MGMT  09/27/2018  . IR RADIOLOGIST EVAL & MGMT  11/22/2018  . IR RADIOLOGIST EVAL & MGMT  04/10/2019  . NECK SURGERY  1994-1995  . RADIOLOGY WITH ANESTHESIA Left 10/31/2018   Procedure: CT WITH ANESTHESIA RENAL CRYO ABLATION;  Surgeon: Aletta Edouard, MD;  Location: [facility];  Service: Radiology;  Laterality: Left;    REVIEW OF SYSTEMS:   Review of Systems  Constitutional:Positive for fatigue and generalized weakness.Negative for appetite change, chills, fever and unexpected weight change.  HENT: Negative for mouth sores, nosebleeds, sore throat and trouble swallowing.  Eyes: Negative for eye problems and icterus.  Respiratory:Positive for chronic cough and shortness of breath.Negative for hemoptysis and wheezing.  Cardiovascular:Positive for bilateral lower extremity swelling.Negative for chest pain. Gastrointestinal:Positive for occasional constipation.Negative for abdominal pain, diarrhea, nausea and vomiting.  Genitourinary: Negative for bladder incontinence, difficulty urinating, dysuria, frequency and hematuria.  Musculoskeletal:Positive for chronic neck and back pain secondary to an old injury. Skin:Positive for multiple scabs on his extremities. Neurological: Negative for dizziness, extremity weakness, gait problem, headaches, light-headedness and seizures.  Hematological: Negative for adenopathy. Does not bruise/bleed easily.  Psychiatric/Behavioral: Negative for confusion, depression and sleep disturbance. The patient is not nervous/anxious.    PHYSICAL EXAMINATION:   Blood pressure 128/71, pulse 80, temperature 98.5 F (36.9 C), temperature source Tympanic, resp. rate 18, height 6\' 2"  (1.88 m), SpO2 100 %.  ECOG PERFORMANCE STATUS: 3 - Symptomatic, >50% confined to bed  Physical Exam  Constitutional: Oriented to person, place, and time andappears older than stated age.No acute distress. HENT:  Head: Normocephalic and atraumatic.  Mouth/Throat: Oropharynx is clear and moist. No oropharyngeal exudate.  Eyes: Conjunctivae are normal. Right eye exhibits no discharge. Left eye exhibits no discharge. No scleral icterus.  Neck: Normal range of motion. Neck supple.  Cardiovascular: Normal rate, regular rhythm, normal heart sounds and intact distal pulses.  Pulmonary/Chest: Effort normal. No wheezing. No respiratory distress. No rales.  Abdominal: Soft. Bowel sounds are normal. Exhibits no distension and no mass. There is no tenderness.  Musculoskeletal:  Bilateral lower extremity edema.normal range of motion. Examined in the wheelchair. Lymphadenopathy:  No cervical adenopathy.  Neurological: Alert and oriented to person, place, and time. Exhibits muscle wasting.  Skin: Skin is warm and dry.Chronic venous insufficiency in lower extremities. Several scabs on the patient's extremities. Not diaphoretic. No pallor.  Psychiatric: Mood, memory and judgment normal.  Vitals reviewed.  LABORATORY DATA: Lab  Results  Component Value Date   WBC 6.7 01/15/2020   HGB 13.2 01/15/2020   HCT 40.5 01/15/2020   MCV 92.7 01/15/2020   PLT 269 01/15/2020      Chemistry      Component Value Date/Time   NA 137 12/18/2019 1406   K 4.6 12/18/2019 1406   CL 106 12/18/2019 1406   CO2 23 12/18/2019 1406   BUN 19 12/18/2019 1406   CREATININE 1.13 12/18/2019 1406      Component Value Date/Time   CALCIUM 9.4 12/18/2019 1406   ALKPHOS 109 12/18/2019 1406   AST 15 12/18/2019 1406   ALT 12 12/18/2019 1406   BILITOT 0.5 12/18/2019 1406       RADIOGRAPHIC  STUDIES:  No results found.   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 Diagnosed in 2019. 2)stage Ia3(T1cN0M0)non-small cell lung cancer. Diagnosed with a left suprahilar region with AP window adenopathy. Status post SBRT 3)renal cell carcinoma status post CT-guided ablation in July 2020. Followed by Dr. [name]. 4)recurrent non-small cell lung cancer in July 2021. Patient presented with new adenopathy in the AP window.No accessible to biopsy.  The patient completed concurrent chemoradiation with carboplatin for an AUC of 2 and paclitaxel 45 mg/m2. He is status post6cycles. His last dose of treatment was on 11/12/19.  The patient is currently undergoing consolidation immunotherapy with Imfinzi 1500 mg IV every 4 weeks.  He is status post his first cycle and he tolerated it well without any concerning adverse side effects.    Labs were reviewed.  Recommend that he proceed with cycle #2 today scheduled.  We will see him back for follow-up visit in 4 weeks for evaluation before starting cycle #3.  I reviewed consolidation immunotherapy with the patient per patient request. We discussed possible adverse side effects. The patient was given a summary on his AVS today for the plan of care.   The patient was advised to call immediately if he has any concerning symptoms in the interval. The patient voices understanding of current disease status and treatment options and is in agreement with the current care plan. All questions were answered. The patient knows to call the clinic with any problems, questions or concerns. We can certainly see the patient much sooner if necessary        No orders of the defined types were placed in this encounter.    Bertha Earwood L Dontre Laduca, PA-C 01/15/20

## 2020-01-13 DIAGNOSIS — I890 Lymphedema, not elsewhere classified: Secondary | ICD-10-CM | POA: Diagnosis not present

## 2020-01-13 DIAGNOSIS — S92911D Unspecified fracture of right toe(s), subsequent encounter for fracture with routine healing: Secondary | ICD-10-CM | POA: Diagnosis not present

## 2020-01-13 DIAGNOSIS — M869 Osteomyelitis, unspecified: Secondary | ICD-10-CM | POA: Diagnosis not present

## 2020-01-15 ENCOUNTER — Inpatient Hospital Stay: Payer: Medicare Other | Attending: Physician Assistant

## 2020-01-15 ENCOUNTER — Inpatient Hospital Stay (HOSPITAL_BASED_OUTPATIENT_CLINIC_OR_DEPARTMENT_OTHER): Payer: Medicare Other | Admitting: Physician Assistant

## 2020-01-15 ENCOUNTER — Inpatient Hospital Stay: Payer: Medicare Other

## 2020-01-15 ENCOUNTER — Other Ambulatory Visit: Payer: Self-pay

## 2020-01-15 VITALS — BP 128/71 | HR 80 | Temp 98.5°F | Resp 18 | Ht 74.0 in

## 2020-01-15 DIAGNOSIS — Z5112 Encounter for antineoplastic immunotherapy: Secondary | ICD-10-CM

## 2020-01-15 DIAGNOSIS — C3412 Malignant neoplasm of upper lobe, left bronchus or lung: Secondary | ICD-10-CM | POA: Insufficient documentation

## 2020-01-15 DIAGNOSIS — Z79899 Other long term (current) drug therapy: Secondary | ICD-10-CM | POA: Diagnosis not present

## 2020-01-15 DIAGNOSIS — C3492 Malignant neoplasm of unspecified part of left bronchus or lung: Secondary | ICD-10-CM | POA: Diagnosis not present

## 2020-01-15 DIAGNOSIS — Z23 Encounter for immunization: Secondary | ICD-10-CM

## 2020-01-15 LAB — CMP (CANCER CENTER ONLY)
ALT: 11 U/L (ref 0–44)
AST: 12 U/L — ABNORMAL LOW (ref 15–41)
Albumin: 3.1 g/dL — ABNORMAL LOW (ref 3.5–5.0)
Alkaline Phosphatase: 98 U/L (ref 38–126)
Anion gap: 9 (ref 5–15)
BUN: 19 mg/dL (ref 8–23)
CO2: 23 mmol/L (ref 22–32)
Calcium: 9.1 mg/dL (ref 8.9–10.3)
Chloride: 107 mmol/L (ref 98–111)
Creatinine: 1.15 mg/dL (ref 0.61–1.24)
GFR, Estimated: >60 mL/min (ref >60)
Glucose, Bld: 96 mg/dL (ref 70–99)
Potassium: 4 mmol/L (ref 3.5–5.1)
Sodium: 139 mmol/L (ref 135–145)
Total Bilirubin: 0.5 mg/dL (ref 0.3–1.2)
Total Protein: 7.5 g/dL (ref 6.5–8.1)

## 2020-01-15 LAB — CBC WITH DIFFERENTIAL (CANCER CENTER ONLY)
Abs Immature Granulocytes: 0.03 10*3/uL (ref 0.00–0.07)
Basophils Absolute: 0 10*3/uL (ref 0.0–0.1)
Basophils Relative: 0 %
Eosinophils Absolute: 0.4 10*3/uL (ref 0.0–0.5)
Eosinophils Relative: 6 %
HCT: 40.5 % (ref 39.0–52.0)
Hemoglobin: 13.2 g/dL (ref 13.0–17.0)
Immature Granulocytes: 0 %
Lymphocytes Relative: 25 %
Lymphs Abs: 1.7 10*3/uL (ref 0.7–4.0)
MCH: 30.2 pg (ref 26.0–34.0)
MCHC: 32.6 g/dL (ref 30.0–36.0)
MCV: 92.7 fL (ref 80.0–100.0)
Monocytes Absolute: 0.8 10*3/uL (ref 0.1–1.0)
Monocytes Relative: 11 %
Neutro Abs: 3.8 10*3/uL (ref 1.7–7.7)
Neutrophils Relative %: 58 %
Platelet Count: 269 10*3/uL (ref 150–400)
RBC: 4.37 MIL/uL (ref 4.22–5.81)
RDW: 16.7 % — ABNORMAL HIGH (ref 11.5–15.5)
WBC Count: 6.7 10*3/uL (ref 4.0–10.5)
nRBC: 0 % (ref 0.0–0.2)

## 2020-01-15 LAB — TSH: TSH: 1.406 u[IU]/mL (ref 0.320–4.118)

## 2020-01-15 MED ORDER — SODIUM CHLORIDE 0.9 % IV SOLN
1500.0000 mg | Freq: Once | INTRAVENOUS | Status: AC
  Administered 2020-01-15: 1500 mg via INTRAVENOUS
  Filled 2020-01-15: qty 30

## 2020-01-15 MED ORDER — SODIUM CHLORIDE 0.9 % IV SOLN
Freq: Once | INTRAVENOUS | Status: AC
  Filled 2020-01-15: qty 250

## 2020-01-15 NOTE — Progress Notes (Signed)
Pt stayed for 15 min post covid booster, tolerated well.  Denies any changes or questions/concerns.

## 2020-01-15 NOTE — Patient Instructions (Signed)
[facility] Discharge Instructions for Patients Receiving Chemotherapy  Today you received the following chemotherapy agent: durvalumab (imfinzi).  To help prevent nausea and vomiting after your treatment, we encourage you to take your nausea medication as directed.   If you develop nausea and vomiting that is not controlled by your nausea medication, call the clinic.   BELOW ARE SYMPTOMS THAT SHOULD BE REPORTED IMMEDIATELY:  *FEVER GREATER THAN 100.5 F  *CHILLS WITH OR WITHOUT FEVER  NAUSEA AND VOMITING THAT IS NOT CONTROLLED WITH YOUR NAUSEA MEDICATION  *UNUSUAL SHORTNESS OF BREATH  *UNUSUAL BRUISING OR BLEEDING  TENDERNESS IN MOUTH AND THROAT WITH OR WITHOUT PRESENCE OF ULCERS  *URINARY PROBLEMS  *BOWEL PROBLEMS  UNUSUAL RASH Items with * indicate a potential emergency and should be followed up as soon as possible.  Feel free to call the clinic should you have any questions or concerns. The clinic phone number is [phone].  Please show the [facility] [place] at check-in to the Emergency Department and triage nurse.

## 2020-01-15 NOTE — Patient Instructions (Signed)
-  We covered a lot of important information at your appointment today regarding what the treatment plan is moving forward. Here are the the main points that were discussed at your office visit with [place] today:  -The treatment will consist of a new medication. This is not chemotherapy. This new drug is a type of Immunotherapy called Imfinzi (Durvalumab).  -Your treatment will be given once every 4 weeks. You will receive this treatment every four weeks for a total of 1 year (13 total treatments) unless you experience unacceptable toxicity or if there is evidence on your routine CT scans that the cancer is growing  -We will get a CT scan after every 3 treatments to check on the progress of treatment  Side Effects:  -The adverse effect of the immunotherapy including but not limited to immunotherapy mediated skin rash, diarrhea, inflammation of the lung, kidney, liver, thyroid or other endocrine dysfunction

## 2020-01-27 DIAGNOSIS — I890 Lymphedema, not elsewhere classified: Secondary | ICD-10-CM | POA: Diagnosis not present

## 2020-01-27 DIAGNOSIS — L97514 Non-pressure chronic ulcer of other part of right foot with necrosis of bone: Secondary | ICD-10-CM | POA: Diagnosis not present

## 2020-01-28 DIAGNOSIS — I87313 Chronic venous hypertension (idiopathic) with ulcer of bilateral lower extremity: Secondary | ICD-10-CM | POA: Diagnosis not present

## 2020-02-12 ENCOUNTER — Encounter: Payer: Self-pay | Admitting: Internal Medicine

## 2020-02-12 ENCOUNTER — Inpatient Hospital Stay: Payer: Medicare Other

## 2020-02-12 ENCOUNTER — Inpatient Hospital Stay: Payer: Medicare Other | Attending: Physician Assistant

## 2020-02-12 ENCOUNTER — Other Ambulatory Visit: Payer: Self-pay | Admitting: Internal Medicine

## 2020-02-12 ENCOUNTER — Inpatient Hospital Stay (HOSPITAL_BASED_OUTPATIENT_CLINIC_OR_DEPARTMENT_OTHER): Payer: Medicare Other | Admitting: Internal Medicine

## 2020-02-12 ENCOUNTER — Other Ambulatory Visit: Payer: Self-pay

## 2020-02-12 VITALS — BP 123/68 | HR 76 | Temp 98.4°F | Resp 18 | Ht 74.0 in

## 2020-02-12 DIAGNOSIS — C3492 Malignant neoplasm of unspecified part of left bronchus or lung: Secondary | ICD-10-CM

## 2020-02-12 DIAGNOSIS — Z79899 Other long term (current) drug therapy: Secondary | ICD-10-CM | POA: Insufficient documentation

## 2020-02-12 DIAGNOSIS — Z5112 Encounter for antineoplastic immunotherapy: Secondary | ICD-10-CM

## 2020-02-12 DIAGNOSIS — C3412 Malignant neoplasm of upper lobe, left bronchus or lung: Secondary | ICD-10-CM | POA: Insufficient documentation

## 2020-02-12 LAB — CBC WITH DIFFERENTIAL (CANCER CENTER ONLY)
Abs Immature Granulocytes: 0.03 10*3/uL (ref 0.00–0.07)
Basophils Absolute: 0.1 10*3/uL (ref 0.0–0.1)
Basophils Relative: 1 %
Eosinophils Absolute: 0.2 10*3/uL (ref 0.0–0.5)
Eosinophils Relative: 3 %
HCT: 42.3 % (ref 39.0–52.0)
Hemoglobin: 14 g/dL (ref 13.0–17.0)
Immature Granulocytes: 0 %
Lymphocytes Relative: 21 %
Lymphs Abs: 1.5 10*3/uL (ref 0.7–4.0)
MCH: 29.5 pg (ref 26.0–34.0)
MCHC: 33.1 g/dL (ref 30.0–36.0)
MCV: 89.1 fL (ref 80.0–100.0)
Monocytes Absolute: 0.6 10*3/uL (ref 0.1–1.0)
Monocytes Relative: 9 %
Neutro Abs: 4.8 10*3/uL (ref 1.7–7.7)
Neutrophils Relative %: 66 %
Platelet Count: 279 10*3/uL (ref 150–400)
RBC: 4.75 MIL/uL (ref 4.22–5.81)
RDW: 14.9 % (ref 11.5–15.5)
WBC Count: 7.3 10*3/uL (ref 4.0–10.5)
nRBC: 0 % (ref 0.0–0.2)

## 2020-02-12 LAB — CMP (CANCER CENTER ONLY)
ALT: 11 U/L (ref 0–44)
AST: 15 U/L (ref 15–41)
Albumin: 3.2 g/dL — ABNORMAL LOW (ref 3.5–5.0)
Alkaline Phosphatase: 100 U/L (ref 38–126)
Anion gap: 10 (ref 5–15)
BUN: 22 mg/dL (ref 8–23)
CO2: 22 mmol/L (ref 22–32)
Calcium: 9.2 mg/dL (ref 8.9–10.3)
Chloride: 106 mmol/L (ref 98–111)
Creatinine: 1.36 mg/dL — ABNORMAL HIGH (ref 0.61–1.24)
GFR, Estimated: 57 mL/min — ABNORMAL LOW (ref >60)
Glucose, Bld: 96 mg/dL (ref 70–99)
Potassium: 4.1 mmol/L (ref 3.5–5.1)
Sodium: 138 mmol/L (ref 135–145)
Total Bilirubin: 0.4 mg/dL (ref 0.3–1.2)
Total Protein: 7.8 g/dL (ref 6.5–8.1)

## 2020-02-12 LAB — TSH: TSH: 1.853 u[IU]/mL (ref 0.320–4.118)

## 2020-02-12 MED ORDER — SODIUM CHLORIDE 0.9 % IV SOLN
1500.0000 mg | Freq: Once | INTRAVENOUS | Status: AC
  Administered 2020-02-12: 1500 mg via INTRAVENOUS
  Filled 2020-02-12: qty 30

## 2020-02-12 MED ORDER — SODIUM CHLORIDE 0.9 % IV SOLN
Freq: Once | INTRAVENOUS | Status: AC
  Filled 2020-02-12: qty 250

## 2020-02-12 NOTE — Progress Notes (Signed)
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 Diagnosed in 2019. 2)stage Ia3(T1cN0M0)non-small cell lung cancer. Diagnosed with a left suprahilar region with AP window adenopathy. Status post SBRT 3)renal cell carcinoma status post CT-guided ablation in July 2020. Followed by Dr. [name]. 4)recurrent non-small cell lung cancer in July 2021. Patient presented with new adenopathy in the AP window.Inaccessible to biopsy  PRIOR THERAPY:  1) SBRT to the LUL nodule in 2019 under the care of Dr. Lisbeth Renshaw 2) SBRT to the left suprahilar mass under the care of Dr. Lisbeth Renshaw in March 2020 3) CT guided ablation to the renal cell carcinoma under the care of Dr. Kathlene Cote. 40 Concurrent chemoradiation with carboplatin for an AUC of 2 and paclitaxel 45 mg/m. First dose on 09/30/2019. Status post6cycles.  Last cycle was given on 11/12/2019 with partial response.  CURRENT THERAPY:  Consolidation treatment with immunotherapy with Imfinzi 1500 mg IV every 4 weeks.  First dose December 18, 2019.  Status post 1 cycle.  INTERVAL HISTORY: Reginald Berry 69 y.o. male returns to the clinic today for follow-up visit accompanied by his sister.  The patient is feeling fine today with no concerning complaints except for mild fatigue.  He tolerated the first cycle of his consolidation treatment with immunotherapy fairly well.  He has no chest pain, shortness of breath except with exertion with no cough or hemoptysis.  He has no nausea, vomiting, diarrhea but has constipation secondary to his pain medication.  He is here today for evaluation before starting cycle #2 of his treatment with immunotherapy.  MEDICAL HISTORY: Past Medical History:  Diagnosis Date  . Aortic atherosclerosis ([place])    . Arthritis   . Chronic low back pain   . COPD (chronic obstructive pulmonary disease) ([place])   . Coronary artery calcification seen on CT scan    Multivessel  . Essential hypertension   . Gait instability    Previous logging accident  . GERD (gastroesophageal reflux disease)   . Lung cancer ([place])    Poorly differentiated, XRT  . Renal cell carcinoma ([facility])    Left, status post cryoablation August 2020    ALLERGIES:  has No Known Allergies.  MEDICATIONS:  Current Outpatient Medications  Medication Sig Dispense Refill  . amLODipine (NORVASC) 10 MG tablet Take 10 mg by mouth daily.    Marland Kitchen aspirin 81 MG EC tablet Take 81 mg by mouth daily. Swallow whole.    Marland Kitchen buPROPion (WELLBUTRIN XL) 300 MG 24 hr tablet Take 300 mg by mouth daily.    . busPIRone (BUSPAR) 15 MG tablet Take 15 mg by mouth daily.    . cephALEXin (KEFLEX) 500 MG capsule Take 1 capsule (500 mg total) by mouth 2 (two) times daily. 20 capsule 0  . cetirizine (ZYRTEC) 10 MG tablet Take 10 mg by mouth daily as needed for allergies.    . furosemide (LASIX) 40 MG tablet Take 40 mg by mouth daily.     . hydrochlorothiazide (MICROZIDE) 12.5 MG capsule Take 12.5 mg by mouth daily.    Marland Kitchen liothyronine (CYTOMEL) 25 MCG tablet Take 25 mcg by mouth daily.    Marland Kitchen losartan (COZAAR) 100 MG tablet Take 100 mg by mouth daily.    . Multiple Vitamins-Minerals (CENTRUM SILVER 50+MEN) TABS Take  1 tablet by mouth daily.    Marland Kitchen oxyCODONE-acetaminophen (PERCOCET) 10-325 MG tablet Take 1 tablet by mouth every 4 (four) hours as needed for pain.    . pantoprazole (PROTONIX) 40 MG tablet Take 40 mg by mouth every evening.     . Probiotic Product (PROBIOTIC DAILY PO) Take 1 capsule by mouth daily.    . prochlorperazine (COMPAZINE) 10 MG tablet Take 1 tablet (10 mg total) by mouth every 6 (six) hours as needed. 30 tablet 2  . rosuvastatin (CRESTOR) 5 MG tablet Take 5 mg by mouth at bedtime.    . sildenafil (VIAGRA) 100 MG tablet Take 100 mg by mouth daily as  needed for erectile dysfunction.     No current facility-administered medications for this visit.    SURGICAL HISTORY:  Past Surgical History:  Procedure Laterality Date  . CHOLECYSTECTOMY     2019  . FRACTURE SURGERY     bil legs (logging trees)  . IR RADIOLOGIST EVAL & MGMT  09/27/2018  . IR RADIOLOGIST EVAL & MGMT  11/22/2018  . IR RADIOLOGIST EVAL & MGMT  04/10/2019  . NECK SURGERY  1994-1995  . RADIOLOGY WITH ANESTHESIA Left 10/31/2018   Procedure: CT WITH ANESTHESIA RENAL CRYO ABLATION;  Surgeon: Aletta Edouard, MD;  Location: [facility];  Service: Radiology;  Laterality: Left;    REVIEW OF SYSTEMS:  A comprehensive review of systems was negative except for: Constitutional: positive for fatigue Gastrointestinal: positive for constipation   PHYSICAL EXAMINATION: General appearance: alert, cooperative, fatigued and no distress Head: Normocephalic, without obvious abnormality, atraumatic Neck: no adenopathy, no JVD, supple, symmetrical, trachea midline and thyroid not enlarged, symmetric, no tenderness/mass/nodules Lymph nodes: Cervical, supraclavicular, and axillary nodes normal. Resp: clear to auscultation bilaterally Back: symmetric, no curvature. ROM normal. No CVA tenderness. Cardio: regular rate and rhythm, S1, S2 normal, no murmur, click, rub or gallop GI: soft, non-tender; bowel sounds normal; no masses,  no organomegaly Extremities: extremities normal, atraumatic, no cyanosis or edema  ECOG PERFORMANCE STATUS: 1 - Symptomatic but completely ambulatory  Blood pressure 123/68, pulse 76, temperature 98.4 F (36.9 C), temperature source Oral, resp. rate 18, height 6\' 2"  (1.88 m), SpO2 99 %.  LABORATORY DATA: Lab Results  Component Value Date   WBC 7.3 02/12/2020   HGB 14.0 02/12/2020   HCT 42.3 02/12/2020   MCV 89.1 02/12/2020   PLT 279 02/12/2020      Chemistry      Component Value Date/Time   NA 138 02/12/2020 1057   K 4.1 02/12/2020 1057   CL 106 02/12/2020  1057   CO2 22 02/12/2020 1057   BUN 22 02/12/2020 1057   CREATININE 1.36 (H) 02/12/2020 1057      Component Value Date/Time   CALCIUM 9.2 02/12/2020 1057   ALKPHOS 100 02/12/2020 1057   AST 15 02/12/2020 1057   ALT 11 02/12/2020 1057   BILITOT 0.4 02/12/2020 1057       RADIOGRAPHIC STUDIES: No results found.  ASSESSMENT AND PLAN: This is a very pleasant 69 years old white male with recurrent non-small cell lung cancer presenting as a stage IIIa with mediastinal lymphadenopathy in July 2021.  The patient has a history of early stage non-small cell lung cancer, adenosquamous carcinoma as stage Ib of the left upper lobe status post SBRT in 2019.  He also has a stage Ia3 non-small cell lung cancer status post SBRT to the left suprahilar region and AP window adenopathy. He completed a course of concurrent chemoradiation  with weekly carboplatin and paclitaxel status post 6 cycles and tolerated his treatment well. The patient is currently undergoing treatment with immunotherapy with Imfinzi 1500 mg IV every 4 weeks status post 1 cycle.  He tolerated the first cycle of his treatment well with no concerning adverse effects. I recommended for him to proceed with cycle #2 today as planned. He will come back for follow-up visit in 4 weeks for evaluation before the next cycle of his treatment. He was advised to call if he has any concerning symptoms in the interval. The patient voices understanding of current disease status and treatment options and is in agreement with the current care plan.  All questions were answered. The patient knows to call the clinic with any problems, questions or concerns. We can certainly see the patient much sooner if necessary.   Disclaimer: This note was dictated with voice recognition software. Similar sounding words can inadvertently be transcribed and may not be corrected upon review.

## 2020-02-12 NOTE — Patient Instructions (Signed)
[facility] Discharge Instructions for Patients Receiving Chemotherapy  Today you received the following chemotherapy agents: Imfinzi.  To help prevent nausea and vomiting after your treatment, we encourage you to take your nausea medication as directed.   If you develop nausea and vomiting that is not controlled by your nausea medication, call the clinic.   BELOW ARE SYMPTOMS THAT SHOULD BE REPORTED IMMEDIATELY:  *FEVER GREATER THAN 100.5 F  *CHILLS WITH OR WITHOUT FEVER  NAUSEA AND VOMITING THAT IS NOT CONTROLLED WITH YOUR NAUSEA MEDICATION  *UNUSUAL SHORTNESS OF BREATH  *UNUSUAL BRUISING OR BLEEDING  TENDERNESS IN MOUTH AND THROAT WITH OR WITHOUT PRESENCE OF ULCERS  *URINARY PROBLEMS  *BOWEL PROBLEMS  UNUSUAL RASH Items with * indicate a potential emergency and should be followed up as soon as possible.  Feel free to call the clinic should you have any questions or concerns. The clinic phone number is [phone].  Please show the CHEMO ALERT CARD at check-in to the Emergency Department and triage nurse.   

## 2020-02-13 DIAGNOSIS — C3490 Malignant neoplasm of unspecified part of unspecified bronchus or lung: Secondary | ICD-10-CM | POA: Diagnosis not present

## 2020-02-13 DIAGNOSIS — J449 Chronic obstructive pulmonary disease, unspecified: Secondary | ICD-10-CM | POA: Diagnosis not present

## 2020-02-13 DIAGNOSIS — I10 Essential (primary) hypertension: Secondary | ICD-10-CM | POA: Diagnosis not present

## 2020-02-13 DIAGNOSIS — Z23 Encounter for immunization: Secondary | ICD-10-CM | POA: Diagnosis not present

## 2020-02-13 DIAGNOSIS — R4582 Worries: Secondary | ICD-10-CM | POA: Diagnosis not present

## 2020-02-22 ENCOUNTER — Other Ambulatory Visit: Payer: Self-pay | Admitting: Physician Assistant

## 2020-02-22 DIAGNOSIS — C3412 Malignant neoplasm of upper lobe, left bronchus or lung: Secondary | ICD-10-CM

## 2020-03-04 ENCOUNTER — Other Ambulatory Visit: Payer: Self-pay | Admitting: Interventional Radiology

## 2020-03-04 DIAGNOSIS — N2889 Other specified disorders of kidney and ureter: Secondary | ICD-10-CM

## 2020-03-11 ENCOUNTER — Inpatient Hospital Stay: Payer: Medicare Other | Attending: Physician Assistant | Admitting: Internal Medicine

## 2020-03-11 ENCOUNTER — Inpatient Hospital Stay: Payer: Medicare Other

## 2020-03-11 ENCOUNTER — Other Ambulatory Visit: Payer: Self-pay

## 2020-03-11 ENCOUNTER — Encounter: Payer: Self-pay | Admitting: Internal Medicine

## 2020-03-11 DIAGNOSIS — Z79899 Other long term (current) drug therapy: Secondary | ICD-10-CM | POA: Diagnosis not present

## 2020-03-11 DIAGNOSIS — Z5112 Encounter for antineoplastic immunotherapy: Secondary | ICD-10-CM | POA: Insufficient documentation

## 2020-03-11 DIAGNOSIS — C3492 Malignant neoplasm of unspecified part of left bronchus or lung: Secondary | ICD-10-CM

## 2020-03-11 DIAGNOSIS — C3412 Malignant neoplasm of upper lobe, left bronchus or lung: Secondary | ICD-10-CM | POA: Insufficient documentation

## 2020-03-11 DIAGNOSIS — F17200 Nicotine dependence, unspecified, uncomplicated: Secondary | ICD-10-CM | POA: Diagnosis not present

## 2020-03-11 DIAGNOSIS — C3490 Malignant neoplasm of unspecified part of unspecified bronchus or lung: Secondary | ICD-10-CM

## 2020-03-11 LAB — CBC WITH DIFFERENTIAL (CANCER CENTER ONLY)
Abs Immature Granulocytes: 0.02 10*3/uL (ref 0.00–0.07)
Basophils Absolute: 0 10*3/uL (ref 0.0–0.1)
Basophils Relative: 1 %
Eosinophils Absolute: 0.2 10*3/uL (ref 0.0–0.5)
Eosinophils Relative: 3 %
HCT: 41.4 % (ref 39.0–52.0)
Hemoglobin: 13.8 g/dL (ref 13.0–17.0)
Immature Granulocytes: 0 %
Lymphocytes Relative: 25 %
Lymphs Abs: 1.6 10*3/uL (ref 0.7–4.0)
MCH: 28.4 pg (ref 26.0–34.0)
MCHC: 33.3 g/dL (ref 30.0–36.0)
MCV: 85.2 fL (ref 80.0–100.0)
Monocytes Absolute: 0.4 10*3/uL (ref 0.1–1.0)
Monocytes Relative: 6 %
Neutro Abs: 4.3 10*3/uL (ref 1.7–7.7)
Neutrophils Relative %: 65 %
Platelet Count: 236 10*3/uL (ref 150–400)
RBC: 4.86 MIL/uL (ref 4.22–5.81)
RDW: 15.2 % (ref 11.5–15.5)
WBC Count: 6.6 10*3/uL (ref 4.0–10.5)
nRBC: 0 % (ref 0.0–0.2)

## 2020-03-11 LAB — CMP (CANCER CENTER ONLY)
ALT: 14 U/L (ref 0–44)
AST: 15 U/L (ref 15–41)
Albumin: 3.3 g/dL — ABNORMAL LOW (ref 3.5–5.0)
Alkaline Phosphatase: 103 U/L (ref 38–126)
Anion gap: 10 (ref 5–15)
BUN: 21 mg/dL (ref 8–23)
CO2: 20 mmol/L — ABNORMAL LOW (ref 22–32)
Calcium: 9.2 mg/dL (ref 8.9–10.3)
Chloride: 106 mmol/L (ref 98–111)
Creatinine: 1.38 mg/dL — ABNORMAL HIGH (ref 0.61–1.24)
GFR, Estimated: 56 mL/min — ABNORMAL LOW (ref >60)
Glucose, Bld: 113 mg/dL — ABNORMAL HIGH (ref 70–99)
Potassium: 4.1 mmol/L (ref 3.5–5.1)
Sodium: 136 mmol/L (ref 135–145)
Total Bilirubin: 0.6 mg/dL (ref 0.3–1.2)
Total Protein: 7.5 g/dL (ref 6.5–8.1)

## 2020-03-11 LAB — TSH: TSH: 2.075 u[IU]/mL (ref 0.320–4.118)

## 2020-03-11 MED ORDER — SODIUM CHLORIDE 0.9 % IV SOLN
Freq: Once | INTRAVENOUS | Status: AC
Start: 2020-03-11 — End: 2020-03-11
  Filled 2020-03-11: qty 250

## 2020-03-11 MED ORDER — SODIUM CHLORIDE 0.9 % IV SOLN
1500.0000 mg | Freq: Once | INTRAVENOUS | Status: AC
  Administered 2020-03-11: 1500 mg via INTRAVENOUS
  Filled 2020-03-11: qty 30

## 2020-03-11 NOTE — Patient Instructions (Signed)
Steps to Quit Smoking Smoking tobacco is the leading cause of preventable death. It can affect almost every organ in the body. Smoking puts you and people around you at risk for many serious, long-lasting (chronic) diseases. Quitting smoking can be hard, but it is one of the best things that you can do for your health. It is never too late to quit. How do I get ready to quit? When you decide to quit smoking, make a plan to help you succeed. Before you quit:  Pick a date to quit. Set a date within the next 2 weeks to give you time to prepare.  Write down the reasons why you are quitting. Keep this list in places where you will see it often.  Tell your family, friends, and co-workers that you are quitting. Their support is important.  Talk with your doctor about the choices that may help you quit.  Find out if your health insurance will pay for these treatments.  Know the people, places, things, and activities that make you want to smoke (triggers). Avoid them. What first steps can I take to quit smoking?  Throw away all cigarettes at home, at work, and in your car.  Throw away the things that you use when you smoke, such as ashtrays and lighters.  Clean your car. Make sure to empty the ashtray.  Clean your home, including curtains and carpets. What can I do to help me quit smoking? Talk with your doctor about taking medicines and seeing a counselor at the same time. You are more likely to succeed when you do both.  If you are pregnant or breastfeeding, talk with your doctor about counseling or other ways to quit smoking. Do not take medicine to help you quit smoking unless your doctor tells you to do so. To quit smoking: Quit right away  Quit smoking totally, instead of slowly cutting back on how much you smoke over a period of time.  Go to counseling. You are more likely to quit if you go to counseling sessions regularly. Take medicine You may take medicines to help you quit. Some  medicines need a prescription, and some you can buy over-the-counter. Some medicines may contain a drug called nicotine to replace the nicotine in cigarettes. Medicines may:  Help you to stop having the desire to smoke (cravings).  Help to stop the problems that come when you stop smoking (withdrawal symptoms). Your doctor may ask you to use:  Nicotine patches, gum, or lozenges.  Nicotine inhalers or sprays.  Non-nicotine medicine that is taken by mouth. Find resources Find resources and other ways to help you quit smoking and remain smoke-free after you quit. These resources are most helpful when you use them often. They include:  Online chats with a counselor.  Phone quitlines.  Printed self-help materials.  Support groups or group counseling.  Text messaging programs.  Mobile phone apps. Use apps on your mobile phone or tablet that can help you stick to your quit plan. There are many free apps for mobile phones and tablets as well as websites. Examples include Quit Guide from the CDC and smokefree.gov  What things can I do to make it easier to quit?   Talk to your family and friends. Ask them to support and encourage you.  Call a phone quitline (1-800-QUIT-NOW), reach out to support groups, or work with a counselor.  Ask people who smoke to not smoke around you.  Avoid places that make you want to smoke,   such as: ? Bars. ? Parties. ? Smoke-break areas at work.  Spend time with people who do not smoke.  Lower the stress in your life. Stress can make you want to smoke. Try these things to help your stress: ? Getting regular exercise. ? Doing deep-breathing exercises. ? Doing yoga. ? Meditating. ? Doing a body scan. To do this, close your eyes, focus on one area of your body at a time from head to toe. Notice which parts of your body are tense. Try to relax the muscles in those areas. How will I feel when I quit smoking? Day 1 to 3 weeks Within the first 24 hours,  you may start to have some problems that come from quitting tobacco. These problems are very bad 2-3 days after you quit, but they do not often last for more than 2-3 weeks. You may get these symptoms:  Mood swings.  Feeling restless, nervous, angry, or annoyed.  Trouble concentrating.  Dizziness.  Strong desire for high-sugar foods and nicotine.  Weight gain.  Trouble pooping (constipation).  Feeling like you may vomit (nausea).  Coughing or a sore throat.  Changes in how the medicines that you take for other issues work in your body.  Depression.  Trouble sleeping (insomnia). Week 3 and afterward After the first 2-3 weeks of quitting, you may start to notice more positive results, such as:  Better sense of smell and taste.  Less coughing and sore throat.  Slower heart rate.  Lower blood pressure.  Clearer skin.  Better breathing.  Fewer sick days. Quitting smoking can be hard. Do not give up if you fail the first time. Some people need to try a few times before they succeed. Do your best to stick to your quit plan, and talk with your doctor if you have any questions or concerns. Summary  Smoking tobacco is the leading cause of preventable death. Quitting smoking can be hard, but it is one of the best things that you can do for your health.  When you decide to quit smoking, make a plan to help you succeed.  Quit smoking right away, not slowly over a period of time.  When you start quitting, seek help from your doctor, family, or friends. This information is not intended to replace advice given to you by your health care provider. Make sure you discuss any questions you have with your health care provider. Document Revised: 11/16/2018 Document Reviewed: 05/12/2018 Elsevier Patient Education  2020 [employer]  

## 2020-03-11 NOTE — Patient Instructions (Signed)
[facility] Discharge Instructions for Patients Receiving Chemotherapy  Today you received the following chemotherapy agents:  Durvalumab (Imfinzi)  To help prevent nausea and vomiting after your treatment, we encourage you to take your nausea medication as prescribed.   If you develop nausea and vomiting that is not controlled by your nausea medication, call the clinic.   BELOW ARE SYMPTOMS THAT SHOULD BE REPORTED IMMEDIATELY:  *FEVER GREATER THAN 100.5 F  *CHILLS WITH OR WITHOUT FEVER  NAUSEA AND VOMITING THAT IS NOT CONTROLLED WITH YOUR NAUSEA MEDICATION  *UNUSUAL SHORTNESS OF BREATH  *UNUSUAL BRUISING OR BLEEDING  TENDERNESS IN MOUTH AND THROAT WITH OR WITHOUT PRESENCE OF ULCERS  *URINARY PROBLEMS  *BOWEL PROBLEMS  UNUSUAL RASH Items with * indicate a potential emergency and should be followed up as soon as possible.  Feel free to call the clinic should you have any questions or concerns. The clinic phone number is [phone].  Please show the CHEMO ALERT CARD at check-in to the Emergency Department and triage nurse.   

## 2020-03-11 NOTE — Progress Notes (Signed)
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 Diagnosed in 2019. 2)stage Ia3(T1cN0M0)non-small cell lung cancer. Diagnosed with a left suprahilar region with AP window adenopathy. Status post SBRT 3)renal cell carcinoma status post CT-guided ablation in July 2020. Followed by Dr. [name]. 4)recurrent non-small cell lung cancer in July 2021. Patient presented with new adenopathy in the AP window.Inaccessible to biopsy  PRIOR THERAPY:  1) SBRT to the LUL nodule in 2019 under the care of Dr. Lisbeth Renshaw 2) SBRT to the left suprahilar mass under the care of Dr. Lisbeth Renshaw in March 2020 3) CT guided ablation to the renal cell carcinoma under the care of Dr. Kathlene Cote. 40 Concurrent chemoradiation with carboplatin for an AUC of 2 and paclitaxel 45 mg/m. First dose on 09/30/2019. Status post6cycles.  Last cycle was given on 11/12/2019 with partial response.  CURRENT THERAPY:  Consolidation treatment with immunotherapy with Imfinzi 1500 mg IV every 4 weeks.  First dose December 18, 2019.  Status post 3 cycles.  INTERVAL HISTORY: Reginald Berry 70 y.o. male returns to the clinic today for follow-up visit.  The patient is feeling fine today with no concerning complaints except for persistent cough.  Unfortunately he continues to smoke.  He denied having any chest pain, shortness of breath or hemoptysis.  He denied having any fever or chills.  He has no nausea, vomiting, diarrhea or constipation.  He denied having any headache or visual changes.  He continues to tolerate his treatment with Imfinzi fairly well.  The patient is here today for evaluation before starting cycle #4.  MEDICAL HISTORY: Past Medical History:  Diagnosis Date  . Aortic atherosclerosis ([place])   .  Arthritis   . Chronic low back pain   . COPD (chronic obstructive pulmonary disease) ([place])   . Coronary artery calcification seen on CT scan    Multivessel  . Essential hypertension   . Gait instability    Previous logging accident  . GERD (gastroesophageal reflux disease)   . Lung cancer ([place])    Poorly differentiated, XRT  . Renal cell carcinoma ([facility])    Left, status post cryoablation August 2020    ALLERGIES:  has No Known Allergies.  MEDICATIONS:  Current Outpatient Medications  Medication Sig Dispense Refill  . amLODipine (NORVASC) 10 MG tablet Take 10 mg by mouth daily.    Marland Kitchen aspirin 81 MG EC tablet Take 81 mg by mouth daily. Swallow whole.    Marland Kitchen buPROPion (WELLBUTRIN XL) 300 MG 24 hr tablet Take 300 mg by mouth daily.    . busPIRone (BUSPAR) 15 MG tablet Take 15 mg by mouth daily.    . cephALEXin (KEFLEX) 500 MG capsule Take 1 capsule (500 mg total) by mouth 2 (two) times daily. 20 capsule 0  . cetirizine (ZYRTEC) 10 MG tablet Take 10 mg by mouth daily as needed for allergies.    . furosemide (LASIX) 40 MG tablet Take 40 mg by mouth daily.     . hydrochlorothiazide (MICROZIDE) 12.5 MG capsule Take 12.5 mg by mouth daily.    Marland Kitchen liothyronine (CYTOMEL) 25 MCG tablet Take 25 mcg by mouth daily.    Marland Kitchen losartan (COZAAR) 100 MG tablet Take 100 mg by mouth daily.    . Multiple Vitamins-Minerals (CENTRUM SILVER 50+MEN) TABS  Take 1 tablet by mouth daily.    Marland Kitchen oxyCODONE-acetaminophen (PERCOCET) 10-325 MG tablet Take 1 tablet by mouth every 4 (four) hours as needed for pain.    . pantoprazole (PROTONIX) 40 MG tablet Take 40 mg by mouth every evening.     . Probiotic Product (PROBIOTIC DAILY PO) Take 1 capsule by mouth daily.    . prochlorperazine (COMPAZINE) 10 MG tablet TAKE ONE TABLET EVERY 6 HOURS AS NEEDED 30 tablet 2  . rosuvastatin (CRESTOR) 5 MG tablet Take 5 mg by mouth at bedtime.    . sildenafil (VIAGRA) 100 MG tablet Take 100 mg by mouth daily as needed for erectile dysfunction.      No current facility-administered medications for this visit.    SURGICAL HISTORY:  Past Surgical History:  Procedure Laterality Date  . CHOLECYSTECTOMY     2019  . FRACTURE SURGERY     bil legs (logging trees)  . IR RADIOLOGIST EVAL & MGMT  09/27/2018  . IR RADIOLOGIST EVAL & MGMT  11/22/2018  . IR RADIOLOGIST EVAL & MGMT  04/10/2019  . NECK SURGERY  1994-1995  . RADIOLOGY WITH ANESTHESIA Left 10/31/2018   Procedure: CT WITH ANESTHESIA RENAL CRYO ABLATION;  Surgeon: Aletta Edouard, MD;  Location: [facility];  Service: Radiology;  Laterality: Left;    REVIEW OF SYSTEMS:  A comprehensive review of systems was negative except for: Constitutional: positive for fatigue Respiratory: positive for cough   PHYSICAL EXAMINATION: General appearance: alert, cooperative, fatigued and no distress Head: Normocephalic, without obvious abnormality, atraumatic Neck: no adenopathy, no JVD, supple, symmetrical, trachea midline and thyroid not enlarged, symmetric, no tenderness/mass/nodules Lymph nodes: Cervical, supraclavicular, and axillary nodes normal. Resp: clear to auscultation bilaterally Back: symmetric, no curvature. ROM normal. No CVA tenderness. Cardio: regular rate and rhythm, S1, S2 normal, no murmur, click, rub or gallop GI: soft, non-tender; bowel sounds normal; no masses,  no organomegaly Extremities: extremities normal, atraumatic, no cyanosis or edema  ECOG PERFORMANCE STATUS: 1 - Symptomatic but completely ambulatory  Blood pressure 132/62, pulse 83, temperature 97.9 F (36.6 C), temperature source Tympanic, resp. rate 18, height 6\' 2"  (1.88 m), SpO2 100 %.  LABORATORY DATA: Lab Results  Component Value Date   WBC 6.6 03/11/2020   HGB 13.8 03/11/2020   HCT 41.4 03/11/2020   MCV 85.2 03/11/2020   PLT 236 03/11/2020      Chemistry      Component Value Date/Time   NA 136 03/11/2020 1150   K 4.1 03/11/2020 1150   CL 106 03/11/2020 1150   CO2 20 (L) 03/11/2020 1150   BUN  21 03/11/2020 1150   CREATININE 1.38 (H) 03/11/2020 1150      Component Value Date/Time   CALCIUM 9.2 03/11/2020 1150   ALKPHOS 103 03/11/2020 1150   AST 15 03/11/2020 1150   ALT 14 03/11/2020 1150   BILITOT 0.6 03/11/2020 1150       RADIOGRAPHIC STUDIES: No results found.  ASSESSMENT AND PLAN: This is a very pleasant 70 years old white male with recurrent non-small cell lung cancer presenting as a stage IIIa with mediastinal lymphadenopathy in July 2021.  The patient has a history of early stage non-small cell lung cancer, adenosquamous carcinoma as stage Ib of the left upper lobe status post SBRT in 2019.  He also has a stage Ia3 non-small cell lung cancer status post SBRT to the left suprahilar region and AP window adenopathy. He completed a course of concurrent chemoradiation with weekly carboplatin and  paclitaxel status post 6 cycles and tolerated his treatment well. The patient is currently undergoing treatment with immunotherapy with Imfinzi 1500 mg IV every 4 weeks status post 3 cycles.   The patient continues to tolerate his treatment well with no concerning adverse effects. I recommended for him to proceed with cycle #4 today as planned. I will see him back for follow-up visit in 4 weeks for evaluation with repeat CT scan of the chest for restaging of his disease. I strongly encouraged him to quit smoking. The patient was advised to call immediately if he has any concerning symptoms in the interval. The patient voices understanding of current disease status and treatment options and is in agreement with the current care plan.  All questions were answered. The patient knows to call the clinic with any problems, questions or concerns. We can certainly see the patient much sooner if necessary.   Disclaimer: This note was dictated with voice recognition software. Similar sounding words can inadvertently be transcribed and may not be corrected upon review.

## 2020-03-16 ENCOUNTER — Telehealth: Payer: Self-pay | Admitting: Internal Medicine

## 2020-03-16 NOTE — Telephone Encounter (Signed)
Scheduled per 1/5 los. Pt will receive an updated appt calendar per next visit appt notes

## 2020-04-03 ENCOUNTER — Telehealth: Payer: Self-pay | Admitting: Internal Medicine

## 2020-04-03 NOTE — Telephone Encounter (Signed)
Rescheduled upcoming appointment due to provider's lunch. Patient's sister is aware of changes.

## 2020-04-04 DIAGNOSIS — Z720 Tobacco use: Secondary | ICD-10-CM | POA: Diagnosis not present

## 2020-04-04 DIAGNOSIS — I10 Essential (primary) hypertension: Secondary | ICD-10-CM | POA: Diagnosis not present

## 2020-04-04 DIAGNOSIS — M5137 Other intervertebral disc degeneration, lumbosacral region: Secondary | ICD-10-CM | POA: Diagnosis not present

## 2020-04-04 DIAGNOSIS — J449 Chronic obstructive pulmonary disease, unspecified: Secondary | ICD-10-CM | POA: Diagnosis not present

## 2020-04-04 DIAGNOSIS — E7849 Other hyperlipidemia: Secondary | ICD-10-CM | POA: Diagnosis not present

## 2020-04-06 ENCOUNTER — Other Ambulatory Visit: Payer: Self-pay

## 2020-04-06 ENCOUNTER — Ambulatory Visit (HOSPITAL_COMMUNITY)
Admission: RE | Admit: 2020-04-06 | Discharge: 2020-04-06 | Disposition: A | Discharge status: Home / Self Care | Payer: Medicare Other | Source: Ambulatory Visit | Attending: Internal Medicine | Admitting: Internal Medicine

## 2020-04-06 DIAGNOSIS — C3490 Malignant neoplasm of unspecified part of unspecified bronchus or lung: Secondary | ICD-10-CM | POA: Insufficient documentation

## 2020-04-06 DIAGNOSIS — I2510 Atherosclerotic heart disease of native coronary artery without angina pectoris: Secondary | ICD-10-CM | POA: Diagnosis not present

## 2020-04-06 DIAGNOSIS — J432 Centrilobular emphysema: Secondary | ICD-10-CM | POA: Diagnosis not present

## 2020-04-06 DIAGNOSIS — R918 Other nonspecific abnormal finding of lung field: Secondary | ICD-10-CM | POA: Diagnosis not present

## 2020-04-06 MED ORDER — IOHEXOL 300 MG/ML  SOLN
75.0000 mL | Freq: Once | INTRAMUSCULAR | Status: AC | PRN
  Administered 2020-04-06: 75 mL via INTRAVENOUS

## 2020-04-06 MED ORDER — IOHEXOL 300 MG/ML  SOLN: 75.0000 mL | Freq: Once | INTRAMUSCULAR | Status: DC | PRN

## 2020-04-08 ENCOUNTER — Other Ambulatory Visit: Payer: Self-pay

## 2020-04-08 ENCOUNTER — Inpatient Hospital Stay: Payer: Medicare Other

## 2020-04-08 ENCOUNTER — Inpatient Hospital Stay: Payer: Medicare Other | Attending: Internal Medicine | Admitting: Internal Medicine

## 2020-04-08 ENCOUNTER — Encounter: Payer: Self-pay | Admitting: Internal Medicine

## 2020-04-08 VITALS — BP 125/63 | HR 79 | Temp 97.7°F | Resp 14 | Ht 74.0 in

## 2020-04-08 DIAGNOSIS — Z5112 Encounter for antineoplastic immunotherapy: Secondary | ICD-10-CM | POA: Insufficient documentation

## 2020-04-08 DIAGNOSIS — Z79899 Other long term (current) drug therapy: Secondary | ICD-10-CM | POA: Insufficient documentation

## 2020-04-08 DIAGNOSIS — C3492 Malignant neoplasm of unspecified part of left bronchus or lung: Secondary | ICD-10-CM

## 2020-04-08 DIAGNOSIS — C3412 Malignant neoplasm of upper lobe, left bronchus or lung: Secondary | ICD-10-CM | POA: Diagnosis not present

## 2020-04-08 DIAGNOSIS — F17200 Nicotine dependence, unspecified, uncomplicated: Secondary | ICD-10-CM | POA: Insufficient documentation

## 2020-04-08 LAB — CBC WITH DIFFERENTIAL (CANCER CENTER ONLY)
Abs Immature Granulocytes: 0.02 10*3/uL (ref 0.00–0.07)
Basophils Absolute: 0.1 10*3/uL (ref 0.0–0.1)
Basophils Relative: 1 %
Eosinophils Absolute: 0.2 10*3/uL (ref 0.0–0.5)
Eosinophils Relative: 3 %
HCT: 44.3 % (ref 39.0–52.0)
Hemoglobin: 14.1 g/dL (ref 13.0–17.0)
Immature Granulocytes: 0 %
Lymphocytes Relative: 22 %
Lymphs Abs: 1.3 10*3/uL (ref 0.7–4.0)
MCH: 27.5 pg (ref 26.0–34.0)
MCHC: 31.8 g/dL (ref 30.0–36.0)
MCV: 86.4 fL (ref 80.0–100.0)
Monocytes Absolute: 0.5 10*3/uL (ref 0.1–1.0)
Monocytes Relative: 8 %
Neutro Abs: 3.8 10*3/uL (ref 1.7–7.7)
Neutrophils Relative %: 66 %
Platelet Count: 231 10*3/uL (ref 150–400)
RBC: 5.13 MIL/uL (ref 4.22–5.81)
RDW: 15.9 % — ABNORMAL HIGH (ref 11.5–15.5)
WBC Count: 5.9 10*3/uL (ref 4.0–10.5)
nRBC: 0 % (ref 0.0–0.2)

## 2020-04-08 LAB — CMP (CANCER CENTER ONLY)
ALT: 14 U/L (ref 0–44)
AST: 13 U/L — ABNORMAL LOW (ref 15–41)
Albumin: 3.3 g/dL — ABNORMAL LOW (ref 3.5–5.0)
Alkaline Phosphatase: 95 U/L (ref 38–126)
Anion gap: 6 (ref 5–15)
BUN: 11 mg/dL (ref 8–23)
CO2: 24 mmol/L (ref 22–32)
Calcium: 9 mg/dL (ref 8.9–10.3)
Chloride: 106 mmol/L (ref 98–111)
Creatinine: 1.07 mg/dL (ref 0.61–1.24)
GFR, Estimated: >60 mL/min (ref >60)
Glucose, Bld: 112 mg/dL — ABNORMAL HIGH (ref 70–99)
Potassium: 3.9 mmol/L (ref 3.5–5.1)
Sodium: 136 mmol/L (ref 135–145)
Total Bilirubin: 0.6 mg/dL (ref 0.3–1.2)
Total Protein: 7.3 g/dL (ref 6.5–8.1)

## 2020-04-08 LAB — TSH: TSH: 2.607 u[IU]/mL (ref 0.320–4.118)

## 2020-04-08 MED ORDER — SODIUM CHLORIDE 0.9 % IV SOLN
1500.0000 mg | Freq: Once | INTRAVENOUS | Status: AC
  Administered 2020-04-08: 1500 mg via INTRAVENOUS
  Filled 2020-04-08: qty 30

## 2020-04-08 MED ORDER — SODIUM CHLORIDE 0.9 % IV SOLN
Freq: Once | INTRAVENOUS | Status: AC
  Filled 2020-04-08: qty 250

## 2020-04-08 NOTE — Progress Notes (Signed)
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 Diagnosed in 2019. 2)stage Ia3(T1cN0M0)non-small cell lung cancer. Diagnosed with a left suprahilar region with AP window adenopathy. Status post SBRT 3)renal cell carcinoma status post CT-guided ablation in July 2020. Followed by Dr. [name]. 4)recurrent non-small cell lung cancer in July 2021. Patient presented with new adenopathy in the AP window.Inaccessible to biopsy  PRIOR THERAPY:  1) SBRT to the LUL nodule in 2019 under the care of Dr. Lisbeth Renshaw 2) SBRT to the left suprahilar mass under the care of Dr. Lisbeth Renshaw in March 2020 3) CT guided ablation to the renal cell carcinoma under the care of Dr. Kathlene Cote. 40 Concurrent chemoradiation with carboplatin for an AUC of 2 and paclitaxel 45 mg/m. First dose on 09/30/2019. Status post6cycles.  Last cycle was given on 11/12/2019 with partial response.  CURRENT THERAPY:  Consolidation treatment with immunotherapy with Imfinzi 1500 mg IV every 4 weeks.  First dose December 18, 2019.  Status post 4 cycles.  INTERVAL HISTORY: Reginald Berry 70 y.o. male returns to the clinic today for follow-up visit accompanied by his Sister [name].  The patient is feeling fine today with no concerning complaints except for the baseline shortness of breath increased with exertion.  He denied having any current chest pain, cough or hemoptysis.  He denied having any fever or chills.  He has no nausea, vomiting, diarrhea or constipation.  He has no headache or visual changes.  He continues to tolerate his treatment with consolidation immunotherapy fairly well.  The patient had repeat CT scan of the chest performed recently and is here for evaluation and discussion of his scan results and  treatment options.  MEDICAL HISTORY: Past Medical History:  Diagnosis Date  . Aortic atherosclerosis ([place])   . Arthritis   . Chronic low back pain   . COPD (chronic obstructive pulmonary disease) ([place])   . Coronary artery calcification seen on CT scan    Multivessel  . Essential hypertension   . Gait instability    Previous logging accident  . GERD (gastroesophageal reflux disease)   . Lung cancer ([place])    Poorly differentiated, XRT  . Renal cell carcinoma ([facility])    Left, status post cryoablation August 2020    ALLERGIES:  has No Known Allergies.  MEDICATIONS:  Current Outpatient Medications  Medication Sig Dispense Refill  . amLODipine (NORVASC) 10 MG tablet Take 10 mg by mouth daily.    Marland Kitchen aspirin 81 MG EC tablet Take 81 mg by mouth daily. Swallow whole.    Marland Kitchen buPROPion (WELLBUTRIN XL) 300 MG 24 hr tablet Take 300 mg by mouth daily.    . busPIRone (BUSPAR) 15 MG tablet Take 15 mg by mouth daily.    . cetirizine (ZYRTEC) 10 MG tablet Take 10 mg by mouth daily as needed for allergies.    . furosemide (LASIX) 40 MG tablet Take 40 mg by mouth daily.     . hydrochlorothiazide (MICROZIDE) 12.5 MG capsule Take 12.5 mg by mouth daily.    Marland Kitchen liothyronine (CYTOMEL) 25 MCG tablet Take 25 mcg by mouth daily.    Marland Kitchen losartan (COZAAR) 100 MG tablet Take 100 mg by mouth daily.    . Multiple Vitamins-Minerals (CENTRUM SILVER 50+MEN) TABS Take 1 tablet by  mouth daily.    Marland Kitchen oxyCODONE-acetaminophen (PERCOCET) 10-325 MG tablet Take 1 tablet by mouth every 4 (four) hours as needed for pain.    . pantoprazole (PROTONIX) 40 MG tablet Take 40 mg by mouth every evening.     . Probiotic Product (PROBIOTIC DAILY PO) Take 1 capsule by mouth daily.    . prochlorperazine (COMPAZINE) 10 MG tablet TAKE ONE TABLET EVERY 6 HOURS AS NEEDED 30 tablet 2  . rosuvastatin (CRESTOR) 5 MG tablet Take 5 mg by mouth at bedtime.    . sildenafil (VIAGRA) 100 MG tablet Take 100 mg by mouth daily as needed for erectile  dysfunction.     No current facility-administered medications for this visit.    SURGICAL HISTORY:  Past Surgical History:  Procedure Laterality Date  . CHOLECYSTECTOMY     2019  . FRACTURE SURGERY     bil legs (logging trees)  . IR RADIOLOGIST EVAL & MGMT  09/27/2018  . IR RADIOLOGIST EVAL & MGMT  11/22/2018  . IR RADIOLOGIST EVAL & MGMT  04/10/2019  . NECK SURGERY  1994-1995  . RADIOLOGY WITH ANESTHESIA Left 10/31/2018   Procedure: CT WITH ANESTHESIA RENAL CRYO ABLATION;  Surgeon: Aletta Edouard, MD;  Location: [facility];  Service: Radiology;  Laterality: Left;    REVIEW OF SYSTEMS:  Constitutional: positive for fatigue Eyes: negative Ears, nose, mouth, throat, and face: negative Respiratory: positive for dyspnea on exertion Cardiovascular: negative Gastrointestinal: negative Genitourinary:negative Integument/breast: negative Hematologic/lymphatic: negative Musculoskeletal:negative Neurological: negative Behavioral/Psych: negative Endocrine: negative Allergic/Immunologic: negative   PHYSICAL EXAMINATION: General appearance: alert, cooperative, fatigued and no distress Head: Normocephalic, without obvious abnormality, atraumatic Neck: no adenopathy, no JVD, supple, symmetrical, trachea midline and thyroid not enlarged, symmetric, no tenderness/mass/nodules Lymph nodes: Cervical, supraclavicular, and axillary nodes normal. Resp: clear to auscultation bilaterally Back: symmetric, no curvature. ROM normal. No CVA tenderness. Cardio: regular rate and rhythm, S1, S2 normal, no murmur, click, rub or gallop GI: soft, non-tender; bowel sounds normal; no masses,  no organomegaly Extremities: extremities normal, atraumatic, no cyanosis or edema Neurologic: Alert and oriented X 3, normal strength and tone. Normal symmetric reflexes. Normal coordination and gait  ECOG PERFORMANCE STATUS: 1 - Symptomatic but completely ambulatory  Blood pressure 125/63, pulse 79, temperature 97.7 F  (36.5 C), temperature source Tympanic, resp. rate 14, height 6\' 2"  (1.88 m), SpO2 99 %.  LABORATORY DATA: Lab Results  Component Value Date   WBC 5.9 04/08/2020   HGB 14.1 04/08/2020   HCT 44.3 04/08/2020   MCV 86.4 04/08/2020   PLT 231 04/08/2020      Chemistry      Component Value Date/Time   NA 136 03/11/2020 1150   K 4.1 03/11/2020 1150   CL 106 03/11/2020 1150   CO2 20 (L) 03/11/2020 1150   BUN 21 03/11/2020 1150   CREATININE 1.38 (H) 03/11/2020 1150      Component Value Date/Time   CALCIUM 9.2 03/11/2020 1150   ALKPHOS 103 03/11/2020 1150   AST 15 03/11/2020 1150   ALT 14 03/11/2020 1150   BILITOT 0.6 03/11/2020 1150       RADIOGRAPHIC STUDIES: CT Chest W Contrast  Result Date: 04/06/2020 CLINICAL DATA:  Non-small cell lung cancer restaging, recurrence in 2021, chemotherapy and XRT complete EXAM: CT CHEST WITH CONTRAST TECHNIQUE: Multidetector CT imaging of the chest was performed during intravenous contrast administration. CONTRAST:  23mL OMNIPAQUE IOHEXOL 300 MG/ML  SOLN COMPARISON:  12/04/2019 FINDINGS: Cardiovascular: No significant vascular findings. Normal heart size. Three-vessel  coronary artery calcifications. No pericardial effusion. Mediastinum/Nodes: Unchanged subcentimeter mediastinal lymph nodes, with particular attention to a previously FDG avid prevascular lymph node, unchanged at 1.1 x 0.8 cm (series 2, image 47). Unchanged appearance of the thyroid with asymmetrically enlarged and heterogeneous right lobe, previously evaluated by ultrasound and biopsy. This has been evaluated on previous imaging. (ref: J Am Coll Radiol. 2015 Feb;12(2): 143-50). Trachea, and esophagus demonstrate no significant findings. Lungs/Pleura: Redemonstrated post treatment appearance of the posterior left pulmonary apex. There is a slight interval increase in consolidation and scattered ground-glass airspace opacity about the treated lesion, which is unchanged in size, the dominant  component measuring 7.3 x 4.1 cm in axial extent (series 7, image 38). Mild underlying centrilobular emphysema. Diffuse bilateral bronchial wall thickening. No pleural effusion or pneumothorax. Upper Abdomen: No acute abnormality. Unchanged low-attenuation benign adrenal adenomata, not previously FDG avid. Musculoskeletal: No chest wall mass or suspicious bone lesions identified. Subacute appearing fractures of the posterior left third and fourth ribs, third rib fracture new compared to prior examination, fourth rib fracture unchanged. IMPRESSION: 1. Redemonstrated post treatment appearance of the posterior left pulmonary apex. There is a slight interval increase in consolidation and scattered ground-glass airspace opacity about the treated lesion, which is unchanged in. Findings are most consistent with continued evolution of radiation change. Attention on follow-up. 2. Unchanged subcentimeter mediastinal lymph nodes, with particular attention to a previously FDG avid prevascular lymph node. 3. Emphysema and diffuse bilateral bronchial wall thickening. 4. Subacute appearing fractures of the posterior left third and fourth ribs, third rib fracture new compared to prior examination, fourth rib fracture unchanged. 5. Three-vessel coronary artery calcifications. Emphysema (ICD10-J43.9). Electronically Signed   By: Eddie Candle M.D.   On: 04/06/2020 10:54    ASSESSMENT AND PLAN: This is a very pleasant 70 years old white male with recurrent non-small cell lung cancer presenting as a stage IIIa with mediastinal lymphadenopathy in July 2021.  The patient has a history of early stage non-small cell lung cancer, adenosquamous carcinoma as stage Ib of the left upper lobe status post SBRT in 2019.  He also has a stage Ia3 non-small cell lung cancer status post SBRT to the left suprahilar region and AP window adenopathy. He completed a course of concurrent chemoradiation with weekly carboplatin and paclitaxel status post 6  cycles and tolerated his treatment well. The patient is currently undergoing treatment with immunotherapy with Imfinzi 1500 mg IV every 4 weeks status post 4 cycles.   The patient has been tolerating this treatment well with no concerning adverse effects. He had repeat CT scan of the chest performed recently.  I personally and independently reviewed the scans and discussed the results with the patient and his sister today. His scan showed no concerning findings for disease progression but there was continuous evaluation of the radiation changes. I recommended for the patient to continue his current treatment with Imfinzi every 4 weeks and he will proceed with cycle #5 today. I will see him back for follow-up visit in 4 weeks for evaluation before the next cycle of his treatment. I strongly recommend for the patient to quit smoking. He was advised to call immediately if he has any other concerning symptoms in the interval. The patient voices understanding of current disease status and treatment options and is in agreement with the current care plan.  All questions were answered. The patient knows to call the clinic with any problems, questions or concerns. We can certainly see the patient much  sooner if necessary.   Disclaimer: This note was dictated with voice recognition software. Similar sounding words can inadvertently be transcribed and may not be corrected upon review.

## 2020-04-08 NOTE — Patient Instructions (Signed)
[facility] Discharge Instructions for Patients Receiving Chemotherapy  Today you received the following chemotherapy agents:  Durvalumab (Imfinzi)  To help prevent nausea and vomiting after your treatment, we encourage you to take your nausea medication as prescribed.   If you develop nausea and vomiting that is not controlled by your nausea medication, call the clinic.   BELOW ARE SYMPTOMS THAT SHOULD BE REPORTED IMMEDIATELY:  *FEVER GREATER THAN 100.5 F  *CHILLS WITH OR WITHOUT FEVER  NAUSEA AND VOMITING THAT IS NOT CONTROLLED WITH YOUR NAUSEA MEDICATION  *UNUSUAL SHORTNESS OF BREATH  *UNUSUAL BRUISING OR BLEEDING  TENDERNESS IN MOUTH AND THROAT WITH OR WITHOUT PRESENCE OF ULCERS  *URINARY PROBLEMS  *BOWEL PROBLEMS  UNUSUAL RASH Items with * indicate a potential emergency and should be followed up as soon as possible.  Feel free to call the clinic should you have any questions or concerns. The clinic phone number is [phone].  Please show the CHEMO ALERT CARD at check-in to the Emergency Department and triage nurse.   

## 2020-04-10 ENCOUNTER — Telehealth: Payer: Self-pay | Admitting: Internal Medicine

## 2020-04-10 NOTE — Telephone Encounter (Signed)
Per 2/2 los, next appt already scheduled

## 2020-05-04 DIAGNOSIS — E7849 Other hyperlipidemia: Secondary | ICD-10-CM | POA: Diagnosis not present

## 2020-05-04 DIAGNOSIS — M5137 Other intervertebral disc degeneration, lumbosacral region: Secondary | ICD-10-CM | POA: Diagnosis not present

## 2020-05-04 DIAGNOSIS — Z720 Tobacco use: Secondary | ICD-10-CM | POA: Diagnosis not present

## 2020-05-04 DIAGNOSIS — J449 Chronic obstructive pulmonary disease, unspecified: Secondary | ICD-10-CM | POA: Diagnosis not present

## 2020-05-04 DIAGNOSIS — I10 Essential (primary) hypertension: Secondary | ICD-10-CM | POA: Diagnosis not present

## 2020-05-05 DIAGNOSIS — E041 Nontoxic single thyroid nodule: Secondary | ICD-10-CM | POA: Diagnosis not present

## 2020-05-05 DIAGNOSIS — R739 Hyperglycemia, unspecified: Secondary | ICD-10-CM | POA: Diagnosis not present

## 2020-05-05 DIAGNOSIS — J449 Chronic obstructive pulmonary disease, unspecified: Secondary | ICD-10-CM | POA: Diagnosis not present

## 2020-05-05 DIAGNOSIS — E782 Mixed hyperlipidemia: Secondary | ICD-10-CM | POA: Diagnosis not present

## 2020-05-05 DIAGNOSIS — Z131 Encounter for screening for diabetes mellitus: Secondary | ICD-10-CM | POA: Diagnosis not present

## 2020-05-05 DIAGNOSIS — E7849 Other hyperlipidemia: Secondary | ICD-10-CM | POA: Diagnosis not present

## 2020-05-05 DIAGNOSIS — I10 Essential (primary) hypertension: Secondary | ICD-10-CM | POA: Diagnosis not present

## 2020-05-05 DIAGNOSIS — Z1329 Encounter for screening for other suspected endocrine disorder: Secondary | ICD-10-CM | POA: Diagnosis not present

## 2020-05-05 DIAGNOSIS — K219 Gastro-esophageal reflux disease without esophagitis: Secondary | ICD-10-CM | POA: Diagnosis not present

## 2020-05-06 ENCOUNTER — Encounter: Payer: Self-pay | Admitting: Internal Medicine

## 2020-05-06 ENCOUNTER — Other Ambulatory Visit: Payer: Medicare Other

## 2020-05-06 ENCOUNTER — Inpatient Hospital Stay: Payer: Medicare Other | Attending: Internal Medicine

## 2020-05-06 ENCOUNTER — Inpatient Hospital Stay (HOSPITAL_BASED_OUTPATIENT_CLINIC_OR_DEPARTMENT_OTHER): Payer: Medicare Other | Admitting: Internal Medicine

## 2020-05-06 ENCOUNTER — Inpatient Hospital Stay: Payer: Medicare Other

## 2020-05-06 ENCOUNTER — Other Ambulatory Visit: Payer: Self-pay

## 2020-05-06 ENCOUNTER — Ambulatory Visit: Payer: Medicare Other | Admitting: Internal Medicine

## 2020-05-06 ENCOUNTER — Ambulatory Visit: Payer: Medicare Other

## 2020-05-06 DIAGNOSIS — Z5112 Encounter for antineoplastic immunotherapy: Secondary | ICD-10-CM | POA: Insufficient documentation

## 2020-05-06 DIAGNOSIS — C3490 Malignant neoplasm of unspecified part of unspecified bronchus or lung: Secondary | ICD-10-CM

## 2020-05-06 DIAGNOSIS — C3412 Malignant neoplasm of upper lobe, left bronchus or lung: Secondary | ICD-10-CM | POA: Insufficient documentation

## 2020-05-06 DIAGNOSIS — Z79899 Other long term (current) drug therapy: Secondary | ICD-10-CM | POA: Diagnosis not present

## 2020-05-06 DIAGNOSIS — C3492 Malignant neoplasm of unspecified part of left bronchus or lung: Secondary | ICD-10-CM

## 2020-05-06 LAB — CBC WITH DIFFERENTIAL (CANCER CENTER ONLY)
Abs Immature Granulocytes: 0.04 10*3/uL (ref 0.00–0.07)
Basophils Absolute: 0.1 10*3/uL (ref 0.0–0.1)
Basophils Relative: 1 %
Eosinophils Absolute: 0.2 10*3/uL (ref 0.0–0.5)
Eosinophils Relative: 3 %
HCT: 39.9 % (ref 39.0–52.0)
Hemoglobin: 13.1 g/dL (ref 13.0–17.0)
Immature Granulocytes: 1 %
Lymphocytes Relative: 22 %
Lymphs Abs: 1.4 10*3/uL (ref 0.7–4.0)
MCH: 27.1 pg (ref 26.0–34.0)
MCHC: 32.8 g/dL (ref 30.0–36.0)
MCV: 82.4 fL (ref 80.0–100.0)
Monocytes Absolute: 0.6 10*3/uL (ref 0.1–1.0)
Monocytes Relative: 10 %
Neutro Abs: 4 10*3/uL (ref 1.7–7.7)
Neutrophils Relative %: 63 %
Platelet Count: 231 10*3/uL (ref 150–400)
RBC: 4.84 MIL/uL (ref 4.22–5.81)
RDW: 17.1 % — ABNORMAL HIGH (ref 11.5–15.5)
WBC Count: 6.4 10*3/uL (ref 4.0–10.5)
nRBC: 0 % (ref 0.0–0.2)

## 2020-05-06 LAB — CMP (CANCER CENTER ONLY)
ALT: 13 U/L (ref 0–44)
AST: 14 U/L — ABNORMAL LOW (ref 15–41)
Albumin: 3.1 g/dL — ABNORMAL LOW (ref 3.5–5.0)
Alkaline Phosphatase: 104 U/L (ref 38–126)
Anion gap: 7 (ref 5–15)
BUN: 14 mg/dL (ref 8–23)
CO2: 21 mmol/L — ABNORMAL LOW (ref 22–32)
Calcium: 8.7 mg/dL — ABNORMAL LOW (ref 8.9–10.3)
Chloride: 107 mmol/L (ref 98–111)
Creatinine: 1.08 mg/dL (ref 0.61–1.24)
GFR, Estimated: >60 mL/min (ref >60)
Glucose, Bld: 95 mg/dL (ref 70–99)
Potassium: 4.3 mmol/L (ref 3.5–5.1)
Sodium: 135 mmol/L (ref 135–145)
Total Bilirubin: 0.4 mg/dL (ref 0.3–1.2)
Total Protein: 7 g/dL (ref 6.5–8.1)

## 2020-05-06 LAB — TSH: TSH: 2.426 u[IU]/mL (ref 0.320–4.118)

## 2020-05-06 MED ORDER — SODIUM CHLORIDE 0.9 % IV SOLN
1500.0000 mg | Freq: Once | INTRAVENOUS | Status: AC
  Administered 2020-05-06: 1500 mg via INTRAVENOUS
  Filled 2020-05-06: qty 30

## 2020-05-06 MED ORDER — SODIUM CHLORIDE 0.9 % IV SOLN
Freq: Once | INTRAVENOUS | Status: AC
  Filled 2020-05-06: qty 250

## 2020-05-06 NOTE — Progress Notes (Signed)
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 Diagnosed in 2019. 2)stage Ia3(T1cN0M0)non-small cell lung cancer. Diagnosed with a left suprahilar region with AP window adenopathy. Status post SBRT 3)renal cell carcinoma status post CT-guided ablation in July 2020. Followed by Dr. [name]. 4)recurrent non-small cell lung cancer in July 2021. Patient presented with new adenopathy in the AP window.Inaccessible to biopsy  PRIOR THERAPY:  1) SBRT to the LUL nodule in 2019 under the care of Dr. Lisbeth Renshaw 2) SBRT to the left suprahilar mass under the care of Dr. Lisbeth Renshaw in March 2020 3) CT guided ablation to the renal cell carcinoma under the care of Dr. Kathlene Cote. 40 Concurrent chemoradiation with carboplatin for an AUC of 2 and paclitaxel 45 mg/m. First dose on 09/30/2019. Status post6cycles.  Last cycle was given on 11/12/2019 with partial response.  CURRENT THERAPY:  Consolidation treatment with immunotherapy with Imfinzi 1500 mg IV every 4 weeks.  First dose December 18, 2019.  Status post 5 cycles.  INTERVAL HISTORY: Reginald Berry 70 y.o. male returns to the clinic today for follow-up visit.  The patient is feeling fine today with no concerning complaints except for the swelling of the lower extremity and generalized fatigue.  He denied having any chest pain, shortness of breath, cough or hemoptysis.  He has no nausea, vomiting, diarrhea or constipation.  He has no skin rash or itching.  He continues to tolerate his consolidation treatment with Imfinzi fairly well.  The patient is here today for evaluation before starting cycle #6 of his treatment.  MEDICAL HISTORY: Past Medical History:  Diagnosis Date  . Aortic atherosclerosis ([place])   . Arthritis   .  Chronic low back pain   . COPD (chronic obstructive pulmonary disease) ([place])   . Coronary artery calcification seen on CT scan    Multivessel  . Essential hypertension   . Gait instability    Previous logging accident  . GERD (gastroesophageal reflux disease)   . Lung cancer ([place])    Poorly differentiated, XRT  . Renal cell carcinoma ([facility])    Left, status post cryoablation August 2020    ALLERGIES:  has No Known Allergies.  MEDICATIONS:  Current Outpatient Medications  Medication Sig Dispense Refill  . amLODipine (NORVASC) 10 MG tablet Take 10 mg by mouth daily.    Marland Kitchen aspirin 81 MG EC tablet Take 81 mg by mouth daily. Swallow whole.    Marland Kitchen buPROPion (WELLBUTRIN XL) 300 MG 24 hr tablet Take 300 mg by mouth daily.    . busPIRone (BUSPAR) 15 MG tablet Take 15 mg by mouth daily.    . cetirizine (ZYRTEC) 10 MG tablet Take 10 mg by mouth daily as needed for allergies.    . furosemide (LASIX) 40 MG tablet Take 40 mg by mouth daily.     . hydrochlorothiazide (MICROZIDE) 12.5 MG capsule Take 12.5 mg by mouth daily.    Marland Kitchen liothyronine (CYTOMEL) 25 MCG tablet Take 25 mcg by mouth daily.    Marland Kitchen losartan (COZAAR) 100 MG tablet Take 100 mg by mouth daily.    . Multiple Vitamins-Minerals (CENTRUM SILVER 50+MEN) TABS Take 1 tablet by mouth daily.    Marland Kitchen oxyCODONE-acetaminophen (PERCOCET) 10-325 MG tablet Take 1 tablet by mouth every 4 (four) hours as  needed for pain.    . pantoprazole (PROTONIX) 40 MG tablet Take 40 mg by mouth every evening.     . Probiotic Product (PROBIOTIC DAILY PO) Take 1 capsule by mouth daily.    . prochlorperazine (COMPAZINE) 10 MG tablet TAKE ONE TABLET EVERY 6 HOURS AS NEEDED 30 tablet 2  . rosuvastatin (CRESTOR) 5 MG tablet Take 5 mg by mouth at bedtime.    . sildenafil (VIAGRA) 100 MG tablet Take 100 mg by mouth daily as needed for erectile dysfunction.     No current facility-administered medications for this visit.    SURGICAL HISTORY:  Past Surgical History:   Procedure Laterality Date  . CHOLECYSTECTOMY     2019  . FRACTURE SURGERY     bil legs (logging [place])  . IR RADIOLOGIST EVAL & MGMT  09/27/2018  . IR RADIOLOGIST EVAL & MGMT  11/22/2018  . IR RADIOLOGIST EVAL & MGMT  04/10/2019  . NECK SURGERY  1994-1995  . RADIOLOGY WITH ANESTHESIA Left 10/31/2018   Procedure: CT WITH ANESTHESIA RENAL CRYO ABLATION;  Surgeon: Aletta Edouard, MD;  Location: [facility];  Service: Radiology;  Laterality: Left;    REVIEW OF SYSTEMS:  A comprehensive review of systems was negative except for: Constitutional: positive for fatigue   PHYSICAL EXAMINATION: General appearance: alert, cooperative, fatigued and no distress Head: Normocephalic, without obvious abnormality, atraumatic Neck: no adenopathy, no JVD, supple, symmetrical, trachea midline and thyroid not enlarged, symmetric, no tenderness/mass/nodules Lymph nodes: Cervical, supraclavicular, and axillary nodes normal. Resp: clear to auscultation bilaterally Back: symmetric, no curvature. ROM normal. No CVA tenderness. Cardio: regular rate and rhythm, S1, S2 normal, no murmur, click, rub or gallop GI: soft, non-tender; bowel sounds normal; no masses,  no organomegaly Extremities: extremities normal, atraumatic, no cyanosis or edema  ECOG PERFORMANCE STATUS: 1 - Symptomatic but completely ambulatory  Blood pressure (!) 156/91, pulse 84, temperature 98.4 F (36.9 C), temperature source Tympanic, resp. rate 20, height 6\' 2"  (1.88 m), weight (!) 353 lb 8 oz (160.3 kg), SpO2 99 %.  LABORATORY DATA: Lab Results  Component Value Date   WBC 6.4 05/06/2020   HGB 13.1 05/06/2020   HCT 39.9 05/06/2020   MCV 82.4 05/06/2020   PLT 231 05/06/2020      Chemistry      Component Value Date/Time   NA 136 04/08/2020 1117   K 3.9 04/08/2020 1117   CL 106 04/08/2020 1117   CO2 24 04/08/2020 1117   BUN 11 04/08/2020 1117   CREATININE 1.07 04/08/2020 1117      Component Value Date/Time   CALCIUM 9.0 04/08/2020  1117   ALKPHOS 95 04/08/2020 1117   AST 13 (L) 04/08/2020 1117   ALT 14 04/08/2020 1117   BILITOT 0.6 04/08/2020 1117       RADIOGRAPHIC STUDIES: No results found.  ASSESSMENT AND PLAN: This is a very pleasant 70 years old white male with recurrent non-small cell lung cancer presenting as a stage IIIa with mediastinal lymphadenopathy in July 2021.  The patient has a history of early stage non-small cell lung cancer, adenosquamous carcinoma as stage Ib of the left upper lobe status post SBRT in 2019.  He also has a stage Ia3 non-small cell lung cancer status post SBRT to the left suprahilar region and AP window adenopathy. He completed a course of concurrent chemoradiation with weekly carboplatin and paclitaxel status post 6 cycles and tolerated his treatment well. The patient is currently undergoing treatment with immunotherapy with Imfinzi 1500  mg IV every 4 weeks status post 5 cycles.   The patient has been tolerating his treatment well with no concerning adverse effects. I recommended for the patient to continue his current treatment and he will proceed with cycle #6 today.  I will see the patient back for follow-up visit in 4 weeks for evaluation with repeat CT scan of the chest for restaging of his disease. The patient was advised to call immediately if he has any other concerning symptoms in the interval. The patient voices understanding of current disease status and treatment options and is in agreement with the current care plan. All questions were answered. The patient knows to call the clinic with any problems, questions or concerns. We can certainly see the patient much sooner if necessary.   Disclaimer: This note was dictated with voice recognition software. Similar sounding words can inadvertently be transcribed and may not be corrected upon review.

## 2020-05-06 NOTE — Patient Instructions (Signed)
Steps to Quit Smoking Smoking tobacco is the leading cause of preventable death. It can affect almost every organ in the body. Smoking puts you and people around you at risk for many serious, long-lasting (chronic) diseases. Quitting smoking can be hard, but it is one of the best things that you can do for your health. It is never too late to quit. How do I get ready to quit? When you decide to quit smoking, make a plan to help you succeed. Before you quit:  Pick a date to quit. Set a date within the next 2 weeks to give you time to prepare.  Write down the reasons why you are quitting. Keep this list in places where you will see it often.  Tell your family, friends, and co-workers that you are quitting. Their support is important.  Talk with your doctor about the choices that may help you quit.  Find out if your health insurance will pay for these treatments.  Know the people, places, things, and activities that make you want to smoke (triggers). Avoid them. What first steps can I take to quit smoking?  Throw away all cigarettes at home, at work, and in your car.  Throw away the things that you use when you smoke, such as ashtrays and lighters.  Clean your car. Make sure to empty the ashtray.  Clean your home, including curtains and carpets. What can I do to help me quit smoking? Talk with your doctor about taking medicines and seeing a counselor at the same time. You are more likely to succeed when you do both.  If you are pregnant or breastfeeding, talk with your doctor about counseling or other ways to quit smoking. Do not take medicine to help you quit smoking unless your doctor tells you to do so. To quit smoking: Quit right away  Quit smoking totally, instead of slowly cutting back on how much you smoke over a period of time.  Go to counseling. You are more likely to quit if you go to counseling sessions regularly. Take medicine You may take medicines to help you quit. Some  medicines need a prescription, and some you can buy over-the-counter. Some medicines may contain a drug called nicotine to replace the nicotine in cigarettes. Medicines may:  Help you to stop having the desire to smoke (cravings).  Help to stop the problems that come when you stop smoking (withdrawal symptoms). Your doctor may ask you to use:  Nicotine patches, gum, or lozenges.  Nicotine inhalers or sprays.  Non-nicotine medicine that is taken by mouth. Find resources Find resources and other ways to help you quit smoking and remain smoke-free after you quit. These resources are most helpful when you use them often. They include:  Online chats with a counselor.  Phone quitlines.  Printed self-help materials.  Support groups or group counseling.  Text messaging programs.  Mobile phone apps. Use apps on your mobile phone or tablet that can help you stick to your quit plan. There are many free apps for mobile phones and tablets as well as websites. Examples include Quit Guide from the CDC and smokefree.gov   What things can I do to make it easier to quit?  Talk to your family and friends. Ask them to support and encourage you.  Call a phone quitline (1-800-QUIT-NOW), reach out to support groups, or work with a counselor.  Ask people who smoke to not smoke around you.  Avoid places that make you want to smoke,   such as: ? Bars. ? Parties. ? Smoke-break areas at work.  Spend time with people who do not smoke.  Lower the stress in your life. Stress can make you want to smoke. Try these things to help your stress: ? Getting regular exercise. ? Doing deep-breathing exercises. ? Doing yoga. ? Meditating. ? Doing a body scan. To do this, close your eyes, focus on one area of your body at a time from head to toe. Notice which parts of your body are tense. Try to relax the muscles in those areas.   How will I feel when I quit smoking? Day 1 to 3 weeks Within the first 24 hours,  you may start to have some problems that come from quitting tobacco. These problems are very bad 2-3 days after you quit, but they do not often last for more than 2-3 weeks. You may get these symptoms:  Mood swings.  Feeling restless, nervous, angry, or annoyed.  Trouble concentrating.  Dizziness.  Strong desire for high-sugar foods and nicotine.  Weight gain.  Trouble pooping (constipation).  Feeling like you may vomit (nausea).  Coughing or a sore throat.  Changes in how the medicines that you take for other issues work in your body.  Depression.  Trouble sleeping (insomnia). Week 3 and afterward After the first 2-3 weeks of quitting, you may start to notice more positive results, such as:  Better sense of smell and taste.  Less coughing and sore throat.  Slower heart rate.  Lower blood pressure.  Clearer skin.  Better breathing.  Fewer sick days. Quitting smoking can be hard. Do not give up if you fail the first time. Some people need to try a few times before they succeed. Do your best to stick to your quit plan, and talk with your doctor if you have any questions or concerns. Summary  Smoking tobacco is the leading cause of preventable death. Quitting smoking can be hard, but it is one of the best things that you can do for your health.  When you decide to quit smoking, make a plan to help you succeed.  Quit smoking right away, not slowly over a period of time.  When you start quitting, seek help from your doctor, family, or friends. This information is not intended to replace advice given to you by your health care provider. Make sure you discuss any questions you have with your health care provider. Document Revised: 11/16/2018 Document Reviewed: 05/12/2018 Elsevier Patient Education  2021 [employer] Inc.  

## 2020-05-06 NOTE — Patient Instructions (Signed)
[facility] Discharge Instructions for Patients Receiving Chemotherapy  Today you received the following chemotherapy agents:  Durvalumab (Imfinzi)  To help prevent nausea and vomiting after your treatment, we encourage you to take your nausea medication as prescribed.   If you develop nausea and vomiting that is not controlled by your nausea medication, call the clinic.   BELOW ARE SYMPTOMS THAT SHOULD BE REPORTED IMMEDIATELY:  *FEVER GREATER THAN 100.5 F  *CHILLS WITH OR WITHOUT FEVER  NAUSEA AND VOMITING THAT IS NOT CONTROLLED WITH YOUR NAUSEA MEDICATION  *UNUSUAL SHORTNESS OF BREATH  *UNUSUAL BRUISING OR BLEEDING  TENDERNESS IN MOUTH AND THROAT WITH OR WITHOUT PRESENCE OF ULCERS  *URINARY PROBLEMS  *BOWEL PROBLEMS  UNUSUAL RASH Items with * indicate a potential emergency and should be followed up as soon as possible.  Feel free to call the clinic should you have any questions or concerns. The clinic phone number is [phone].  Please show the CHEMO ALERT CARD at check-in to the Emergency Department and triage nurse.   

## 2020-05-08 ENCOUNTER — Telehealth: Payer: Self-pay | Admitting: Internal Medicine

## 2020-05-08 NOTE — Telephone Encounter (Signed)
Scheduled per los. Called and spoke with patient confirmed appt  

## 2020-05-14 DIAGNOSIS — I10 Essential (primary) hypertension: Secondary | ICD-10-CM | POA: Diagnosis not present

## 2020-05-14 DIAGNOSIS — C3490 Malignant neoplasm of unspecified part of unspecified bronchus or lung: Secondary | ICD-10-CM | POA: Diagnosis not present

## 2020-05-14 DIAGNOSIS — Z0001 Encounter for general adult medical examination with abnormal findings: Secondary | ICD-10-CM | POA: Diagnosis not present

## 2020-05-14 DIAGNOSIS — C649 Malignant neoplasm of unspecified kidney, except renal pelvis: Secondary | ICD-10-CM | POA: Diagnosis not present

## 2020-05-14 DIAGNOSIS — I714 Abdominal aortic aneurysm, without rupture: Secondary | ICD-10-CM | POA: Diagnosis not present

## 2020-05-14 DIAGNOSIS — Z23 Encounter for immunization: Secondary | ICD-10-CM | POA: Diagnosis not present

## 2020-05-14 DIAGNOSIS — E7849 Other hyperlipidemia: Secondary | ICD-10-CM | POA: Diagnosis not present

## 2020-05-14 DIAGNOSIS — R4582 Worries: Secondary | ICD-10-CM | POA: Diagnosis not present

## 2020-05-31 NOTE — Progress Notes (Signed)
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 Diagnosed in 2019. 2)stage Ia3(T1cN0M0)non-small cell lung cancer. Diagnosed with a left suprahilar region with AP window adenopathy. Status post SBRT 3)renal cell carcinoma status post CT-guided ablation in July 2020. Followed by Dr. [name]. 4)recurrent non-small cell lung cancer in July 2021. Patient presented with new adenopathy in the AP window.Inaccessible to biopsy  PRIOR THERAPY: 1) SBRT to the LUL nodule in 2019 under the care of Dr. Lisbeth Renshaw 2) SBRT to the left suprahilar mass under the care of Dr. Lisbeth Renshaw in March 2020 3) CT guided ablation to the renal cell carcinoma under the care of Dr. Kathlene Cote. 4) Concurrent chemoradiation with carboplatin for an AUC of 2 and paclitaxel 45 mg/m. Last cycle given on 11/12/19.Status post6cycles. He had a partial response to treatment  CURRENT THERAPY: Consolidation treatment with immunotherapy with Imfinzi 1500 mgIV every 4 weeks. First dose December 18, 2019. Status post 6 cycles .    INTERVAL HISTORY: Reginald Berry 70 y.o. male returns to the clinic for a follow up visit accompanied by his sister. The patient is feeling fairly well today without any concerning complaints. The patient continues to tolerate treatment with consolidation immunotherapy with Imfinzi well without any adverse side effects.The patient denies any fever, chills, night sweats, or weight loss.  The patient reports his baseline dyspnea on exertion and chronic cough which produces clear phlegm. The patient denies any chest pain or hemoptysis.  He denies any nausea, vomiting, or diarrhea. Sometimes he experiences constipation. He has chronic back pain. He denies any headache or visual changes.  He denies any rashes or skin changes. Denies any rashes or  skin changes. The patient recently had a restaging CT scan of the chest performed. The patient is here today for evaluation and to review his scan results prior to starting cycle #7     MEDICAL HISTORY: Past Medical History:  Diagnosis Date  . Aortic atherosclerosis ([place])   . Arthritis   . Chronic low back pain   . COPD (chronic obstructive pulmonary disease) ([place])   . Coronary artery calcification seen on CT scan    Multivessel  . Essential hypertension   . Gait instability    Previous logging accident  . GERD (gastroesophageal reflux disease)   . Lung cancer ([place])    Poorly differentiated, XRT  . Renal cell carcinoma ([facility])    Left, status post cryoablation August 2020    ALLERGIES:  has No Known Allergies.  MEDICATIONS:  Current Outpatient Medications  Medication Sig Dispense Refill  . amLODipine (NORVASC) 10 MG tablet Take 10 mg by mouth daily.    Marland Kitchen aspirin 81 MG EC tablet Take 81 mg by mouth daily. Swallow whole.    Marland Kitchen buPROPion (WELLBUTRIN XL) 300 MG 24 hr tablet Take 300 mg by mouth daily.    . busPIRone (BUSPAR) 15 MG tablet Take 15 mg by mouth daily.    . cetirizine (ZYRTEC) 10 MG tablet Take 10 mg by mouth daily as needed for allergies.    . furosemide (LASIX) 40 MG tablet Take 40 mg by mouth daily.     . hydrochlorothiazide (MICROZIDE) 12.5 MG capsule Take 12.5 mg by mouth daily.    Marland Kitchen liothyronine (CYTOMEL) 25 MCG tablet Take 25 mcg by mouth daily.    Marland Kitchen losartan (COZAAR) 100  MG tablet Take 100 mg by mouth daily.    . Multiple Vitamins-Minerals (CENTRUM SILVER 50+MEN) TABS Take 1 tablet by mouth daily.    Marland Kitchen oxyCODONE-acetaminophen (PERCOCET) 10-325 MG tablet Take 1 tablet by mouth every 4 (four) hours as needed for pain.    . pantoprazole (PROTONIX) 40 MG tablet Take 40 mg by mouth every evening.     . Probiotic Product (PROBIOTIC DAILY PO) Take 1 capsule by mouth daily.    . prochlorperazine (COMPAZINE) 10 MG tablet TAKE ONE TABLET EVERY 6 HOURS AS NEEDED 30 tablet 2   . rosuvastatin (CRESTOR) 5 MG tablet Take 5 mg by mouth at bedtime.    . sildenafil (VIAGRA) 100 MG tablet Take 100 mg by mouth daily as needed for erectile dysfunction.     No current facility-administered medications for this visit.    SURGICAL HISTORY:  Past Surgical History:  Procedure Laterality Date  . CHOLECYSTECTOMY     2019  . FRACTURE SURGERY     bil legs (logging [place])  . IR RADIOLOGIST EVAL & MGMT  09/27/2018  . IR RADIOLOGIST EVAL & MGMT  11/22/2018  . IR RADIOLOGIST EVAL & MGMT  04/10/2019  . NECK SURGERY  1994-1995  . RADIOLOGY WITH ANESTHESIA Left 10/31/2018   Procedure: CT WITH ANESTHESIA RENAL CRYO ABLATION;  Surgeon: Aletta Edouard, MD;  Location: [facility];  Service: Radiology;  Laterality: Left;    REVIEW OF SYSTEMS:   Review of Systems  Constitutional:Positive for fatigue and generalized weakness.Negative for appetite change, chills, fever and unexpected weight change.  HENT: Negative for mouth sores, nosebleeds, sore throat and trouble swallowing.  Eyes: Negative for eye problems and icterus.  Respiratory:Positive for chronic cough and shortness of breath.Negative for hemoptysis and wheezing.  Cardiovascular:Positive for bilateral lower extremity swelling.Negative for chest pain. Gastrointestinal:Positive for occasional constipation.Negative for abdominal pain, diarrhea, nausea and vomiting.  Genitourinary: Negative for bladder incontinence, difficulty urinating, dysuria, frequency and hematuria.  Musculoskeletal:Positive for chronic neck and back pain secondary to an old injury. Skin:Positive for multiple scabs on his extremities. Neurological: Negative for dizziness, extremity weakness, gait problem, headaches, light-headedness and seizures.  Hematological: Negative for adenopathy. Does not bruise/bleed easily.  Psychiatric/Behavioral: Negative for confusion, depression and sleep disturbance. The patient is not nervous/anxious.     PHYSICAL  EXAMINATION:  There were no vitals taken for this visit.  ECOG PERFORMANCE STATUS: 2-3   Physical Exam  Constitutional: Oriented to person, place, and time andappears older than stated age.No acute distress. HENT:  Head: Normocephalic and atraumatic.  Mouth/Throat: Oropharynx is clear and moist. No oropharyngeal exudate.  Eyes: Conjunctivae are normal. Right eye exhibits no discharge. Left eye exhibits no discharge. No scleral icterus.  Neck: Normal range of motion. Neck supple.  Cardiovascular: Normal rate, regular rhythm, normal heart sounds and intact distal pulses.  Pulmonary/Chest: Effort normal. No wheezing. No respiratory distress. No rales.  Abdominal: Soft. Bowel sounds are normal. Exhibits no distension and no mass. There is no tenderness.  Musculoskeletal: Bilateral lower extremity edema.normal range of motion. Examined in the wheelchair. Lymphadenopathy:  No cervical adenopathy.  Neurological: Alert and oriented to person, place, and time. Exhibits muscle wasting.  Skin: Skin is warm and dry.Chronic venous insufficiency in lower extremities. Several scabs on the patient's extremities. Not diaphoretic. No pallor.  Psychiatric: Mood, memory and judgment normal.  Vitals reviewed.    LABORATORY DATA: Lab Results  Component Value Date   WBC 6.4 05/06/2020   HGB 13.1 05/06/2020   HCT 39.9  05/06/2020   MCV 82.4 05/06/2020   PLT 231 05/06/2020      Chemistry      Component Value Date/Time   NA 135 05/06/2020 1311   K 4.3 05/06/2020 1311   CL 107 05/06/2020 1311   CO2 21 (L) 05/06/2020 1311   BUN 14 05/06/2020 1311   CREATININE 1.08 05/06/2020 1311      Component Value Date/Time   CALCIUM 8.7 (L) 05/06/2020 1311   ALKPHOS 104 05/06/2020 1311   AST 14 (L) 05/06/2020 1311   ALT 13 05/06/2020 1311   BILITOT 0.4 05/06/2020 1311       RADIOGRAPHIC STUDIES:  No results found.   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 Diagnosed in 2019. 2)stage Ia3(T1cN0M0)non-small cell lung cancer. Diagnosed with a left suprahilar region with AP window adenopathy. Status post SBRT 3)renal cell carcinoma status post CT-guided ablation in July 2020. Followed by Dr. [name]. 4)recurrent non-small cell lung cancer in July 2021. Patient presented with new adenopathy in the AP window.No accessible to biopsy.  The patient completed concurrent chemoradiation with carboplatin for an AUC of 2 and paclitaxel 45 mg/m2. He is status post6cycles. His last dose of treatment was on 11/12/19.  The patient is currently undergoing consolidation immunotherapy with Imfinzi 1500 mg IV every 4 weeks.  He is status post 6 cycles and has been tolerating it well.   The patient recently had a restaging CT scan of the chest. Dr. Julien Nordmann personally and independelty reviewed the scan and discussed the results with the patient today. The scan showed no evidence for disease progression. Dr. Julien Nordmann recommends that he continue on the same treatment at the same dose. He will proceed with cycle #7 today as scheduled.   We will see him back for a follow up visit in 4 weeks for evaluation before starting cycle #8.   The patient was advised to call immediately if he has any concerning symptoms in the interval. The patient voices understanding of current disease status and treatment options and is in agreement with the current care plan. All questions were answered. The patient knows to call the clinic with any problems, questions or concerns. We can certainly see the patient much sooner if necessary   No orders of the defined types were placed in this encounter.     Cassandra L Heilingoetter, PA-C 05/31/20  ADDENDUM: Hematology/Oncology Attending: I had a face-to-face encounter with the patient today.  I reviewed  his record, scan and recommended his care plan.  This is a very pleasant 70 years old white male diagnosed with metastatic non-small cell lung cancer that was initially diagnosed as stage Ib in 2019 status post SBRT with disease recurrence and July 2021.  The patient underwent a course of concurrent chemoradiation with weekly carboplatin and paclitaxel for 6 cycles with partial response. He is currently undergoing consolidation treatment with immunotherapy with Imfinzi 1500 mg IV every 4 weeks status post 6 cycles.  The patient has been tolerating his treatment well with no concerning adverse effects. He had repeat CT scan of the chest performed recently.  I personally and independently reviewed the scan and discussed the results with the patient and his sister. His scan showed no concerning findings for disease progression. I recommended for the patient to continue his current treatment with Imfinzi and he will proceed with cycle #7 today. The patient will  come back for follow-up visit in 4 weeks for evaluation before the next cycle of his treatment. He was advised to call immediately if he has any other concerning symptoms in the interval.  Disclaimer: This note was dictated with voice recognition software. Similar sounding words can inadvertently be transcribed and may be missed upon review. Eilleen Kempf, MD 06/03/20

## 2020-06-02 ENCOUNTER — Other Ambulatory Visit: Payer: Self-pay

## 2020-06-02 ENCOUNTER — Ambulatory Visit (HOSPITAL_COMMUNITY)
Admission: RE | Admit: 2020-06-02 | Discharge: 2020-06-02 | Disposition: A | Discharge status: Home / Self Care | Payer: Medicare Other | Source: Ambulatory Visit | Attending: Internal Medicine | Admitting: Internal Medicine

## 2020-06-02 DIAGNOSIS — C3490 Malignant neoplasm of unspecified part of unspecified bronchus or lung: Secondary | ICD-10-CM | POA: Diagnosis not present

## 2020-06-02 DIAGNOSIS — J8410 Pulmonary fibrosis, unspecified: Secondary | ICD-10-CM | POA: Diagnosis not present

## 2020-06-02 MED ORDER — IOHEXOL 300 MG/ML  SOLN
75.0000 mL | Freq: Once | INTRAMUSCULAR | Status: AC | PRN
  Administered 2020-06-02: 75 mL via INTRAVENOUS

## 2020-06-03 ENCOUNTER — Inpatient Hospital Stay: Payer: Medicare Other

## 2020-06-03 ENCOUNTER — Encounter: Payer: Self-pay | Admitting: Physician Assistant

## 2020-06-03 ENCOUNTER — Encounter: Payer: Self-pay | Admitting: General Practice

## 2020-06-03 ENCOUNTER — Inpatient Hospital Stay (HOSPITAL_BASED_OUTPATIENT_CLINIC_OR_DEPARTMENT_OTHER): Payer: Medicare Other | Admitting: Physician Assistant

## 2020-06-03 VITALS — BP 123/41 | HR 81 | Temp 97.4°F | Resp 20 | Ht 74.0 in

## 2020-06-03 DIAGNOSIS — C3412 Malignant neoplasm of upper lobe, left bronchus or lung: Secondary | ICD-10-CM | POA: Diagnosis not present

## 2020-06-03 DIAGNOSIS — Z5112 Encounter for antineoplastic immunotherapy: Secondary | ICD-10-CM

## 2020-06-03 DIAGNOSIS — C3492 Malignant neoplasm of unspecified part of left bronchus or lung: Secondary | ICD-10-CM

## 2020-06-03 DIAGNOSIS — Z79899 Other long term (current) drug therapy: Secondary | ICD-10-CM | POA: Diagnosis not present

## 2020-06-03 LAB — CMP (CANCER CENTER ONLY)
ALT: 13 U/L (ref 0–44)
AST: 15 U/L (ref 15–41)
Albumin: 3.5 g/dL (ref 3.5–5.0)
Alkaline Phosphatase: 103 U/L (ref 38–126)
Anion gap: 9 (ref 5–15)
BUN: 16 mg/dL (ref 8–23)
CO2: 23 mmol/L (ref 22–32)
Calcium: 8.8 mg/dL — ABNORMAL LOW (ref 8.9–10.3)
Chloride: 105 mmol/L (ref 98–111)
Creatinine: 1.09 mg/dL (ref 0.61–1.24)
GFR, Estimated: >60 mL/min (ref >60)
Glucose, Bld: 121 mg/dL — ABNORMAL HIGH (ref 70–99)
Potassium: 4.2 mmol/L (ref 3.5–5.1)
Sodium: 137 mmol/L (ref 135–145)
Total Bilirubin: 0.6 mg/dL (ref 0.3–1.2)
Total Protein: 7.6 g/dL (ref 6.5–8.1)

## 2020-06-03 LAB — CBC WITH DIFFERENTIAL (CANCER CENTER ONLY)
Abs Immature Granulocytes: 0.02 10*3/uL (ref 0.00–0.07)
Basophils Absolute: 0 10*3/uL (ref 0.0–0.1)
Basophils Relative: 1 %
Eosinophils Absolute: 0.2 10*3/uL (ref 0.0–0.5)
Eosinophils Relative: 3 %
HCT: 43.7 % (ref 39.0–52.0)
Hemoglobin: 14.1 g/dL (ref 13.0–17.0)
Immature Granulocytes: 0 %
Lymphocytes Relative: 22 %
Lymphs Abs: 1.3 10*3/uL (ref 0.7–4.0)
MCH: 27 pg (ref 26.0–34.0)
MCHC: 32.3 g/dL (ref 30.0–36.0)
MCV: 83.7 fL (ref 80.0–100.0)
Monocytes Absolute: 0.4 10*3/uL (ref 0.1–1.0)
Monocytes Relative: 7 %
Neutro Abs: 4.1 10*3/uL (ref 1.7–7.7)
Neutrophils Relative %: 67 %
Platelet Count: 241 10*3/uL (ref 150–400)
RBC: 5.22 MIL/uL (ref 4.22–5.81)
RDW: 17.3 % — ABNORMAL HIGH (ref 11.5–15.5)
WBC Count: 6 10*3/uL (ref 4.0–10.5)
nRBC: 0 % (ref 0.0–0.2)

## 2020-06-03 LAB — TSH: TSH: 2.977 u[IU]/mL (ref 0.320–4.118)

## 2020-06-03 MED ORDER — SODIUM CHLORIDE 0.9 % IV SOLN
Freq: Once | INTRAVENOUS | Status: AC
  Filled 2020-06-03: qty 250

## 2020-06-03 MED ORDER — SODIUM CHLORIDE 0.9 % IV SOLN
1500.0000 mg | Freq: Once | INTRAVENOUS | Status: AC
  Administered 2020-06-03: 1500 mg via INTRAVENOUS
  Filled 2020-06-03: qty 30

## 2020-06-03 NOTE — Patient Instructions (Signed)
[facility] Discharge Instructions for Patients Receiving Chemotherapy  Today you received the following chemotherapy agents:  Durvalumab (Imfinzi)  To help prevent nausea and vomiting after your treatment, we encourage you to take your nausea medication as prescribed.   If you develop nausea and vomiting that is not controlled by your nausea medication, call the clinic.   BELOW ARE SYMPTOMS THAT SHOULD BE REPORTED IMMEDIATELY:  *FEVER GREATER THAN 100.5 F  *CHILLS WITH OR WITHOUT FEVER  NAUSEA AND VOMITING THAT IS NOT CONTROLLED WITH YOUR NAUSEA MEDICATION  *UNUSUAL SHORTNESS OF BREATH  *UNUSUAL BRUISING OR BLEEDING  TENDERNESS IN MOUTH AND THROAT WITH OR WITHOUT PRESENCE OF ULCERS  *URINARY PROBLEMS  *BOWEL PROBLEMS  UNUSUAL RASH Items with * indicate a potential emergency and should be followed up as soon as possible.  Feel free to call the clinic should you have any questions or concerns. The clinic phone number is [phone].  Please show the CHEMO ALERT CARD at check-in to the Emergency Department and triage nurse.   

## 2020-06-03 NOTE — Progress Notes (Signed)
[facility] CSW Progress Notes  Sister requests to talk w social worker - states patient's power will be cut off soon.  Met w Rhiley Solem in [place] office.  Power is currently in patient's mother's name - she has been deceased since May 02, 2006.  Patient must have power in his own name and must pay deposit of $400 approx in order to do so.  If not, power will be cut off.  Conferred w L White, Estate manager/land agent.  Patient has not applied for [employer] which could assist w this need.  Sister informed that patient needs to provide income statements in order to qualify - she will bring these next week.  Called [employer] [phone] - acct [phone]), they emailed statement detailing what patient needs to pay, emailed this to L White for her reference.  Enrolled patient in [facility], provided first disbursement.  Sent in patient's application for [employer] gas card program.  Sister is driving him to/from treatments and gas is a hardship for her/him.  Edwyna Shell, LCSW Clinical Social Worker Phone:  [phone]

## 2020-06-04 ENCOUNTER — Encounter: Payer: Self-pay | Admitting: General Practice

## 2020-06-04 ENCOUNTER — Telehealth: Payer: Self-pay | Admitting: General Practice

## 2020-06-04 NOTE — Progress Notes (Signed)
[facility] CSW Progress Notes  Call from [place], [place].  Encourages patient/caregiver to apply for Special Assistance funds as he is Medicaid eligible - this would give additional funding to support patient remaining in home and assign case worker to patient to oversee his care monthly.  Goal of program is to maintain people in their homes vs in placement.  Avis Epley will provide copy of form needed for patient to designate sister as his representative, allowing her to apply on his behalf for this program at [facility] in [place].  Attempted to inform sister, no answer, left VM asking her to return my call.  Edwyna Shell, LCSW Clinical Social Worker Phone:  [phone]

## 2020-06-04 NOTE — Telephone Encounter (Signed)
[facility] CSW Progress Notes  Spoke w sister, [name].  Discussed form needed for patient to designate a representative to help him apply for Special Assistance from [place] - she will pick up form on Monday at [facility].  Edwyna Shell, LCSW Clinical Social Worker Phone:  [phone]

## 2020-06-09 ENCOUNTER — Encounter: Payer: Self-pay | Admitting: Internal Medicine

## 2020-06-09 NOTE — Progress Notes (Signed)
Pt is approved for the $1000 [employer] grant.  

## 2020-06-27 NOTE — Progress Notes (Signed)
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 Diagnosed in 2019. 2)stage Ia3(T1cN0M0)non-small cell lung cancer. Diagnosed with a left suprahilar region with AP window adenopathy. Status post SBRT 3)renal cell carcinoma status post CT-guided ablation in July 2020. Followed by Dr. [name]. 4)recurrent non-small cell lung cancer in July 2021. Patient presented with new adenopathy in the AP window.Inaccessible to biopsy  PRIOR THERAPY: 1) SBRT to the LUL nodule in 2019 under the care of Dr. Lisbeth Renshaw 2) SBRT to the left suprahilar mass under the care of Dr. Lisbeth Renshaw in March 2020 3) CT guided ablation to the renal cell carcinoma under the care of Dr. Kathlene Cote. 4)Concurrent chemoradiation with carboplatin for an AUC of 2 and paclitaxel 45 mg/m. Last cycle given on 11/12/19.Status post6cycles.He had a partial response to treatment  CURRENT THERAPY: Consolidation treatment with immunotherapy with Imfinzi 1500 mgIV every 4 weeks. First dose December 18, 2019. Status post 7 cycles   INTERVAL HISTORY: Reginald Berry 70 y.o. male returns to the clinic for a follow up visit. The patient is feeling fairly well today without any concerning complaints. The patient continues to tolerate treatment with consolidation immunotherapy with Imfinzi well without any adverse side effects.The patient denies any fever, chills, night sweats, or weight loss. The patient reports his baseline dyspnea on exertion and chronic cough which produces clear phlegm.He sometimes has chest tightness which he thinks flares up because of allergies. The patient denies any chest pain or hemoptysis. He denies any nausea, vomiting, or diarrhea. Denies recent constipation. He has chronic back pain. He denies any headache or visual changes. He denies any rashes  or skin changes except he has some superficial telangiectasias on his chest and he wanted to make sure that it was not melanoma. The patient is here today for evaluation and to review his scan results prior to starting cycle #8  MEDICAL HISTORY: Past Medical History:  Diagnosis Date  . Aortic atherosclerosis ([place])   . Arthritis   . Chronic low back pain   . COPD (chronic obstructive pulmonary disease) ([place])   . Coronary artery calcification seen on CT scan    Multivessel  . Essential hypertension   . Gait instability    Previous logging accident  . GERD (gastroesophageal reflux disease)   . Lung cancer ([place])    Poorly differentiated, XRT  . Renal cell carcinoma ([facility])    Left, status post cryoablation August 2020    ALLERGIES:  has No Known Allergies.  MEDICATIONS:  Current Outpatient Medications  Medication Sig Dispense Refill  . ALPRAZolam (XANAX) 0.5 MG tablet Take 0.5 mg by mouth daily as needed.    Marland Kitchen amLODipine (NORVASC) 10 MG tablet Take 10 mg by mouth daily.    Marland Kitchen aspirin 81 MG EC tablet Take 81 mg by mouth daily. Swallow whole.    Marland Kitchen buPROPion (WELLBUTRIN XL) 300 MG 24 hr tablet Take 300 mg by mouth daily.    . busPIRone (BUSPAR) 15 MG tablet Take 15 mg by mouth daily.    . cetirizine (ZYRTEC) 10 MG tablet Take 10 mg by mouth daily as needed for allergies.    . furosemide (LASIX) 40 MG tablet Take 40 mg by mouth daily.     . hydrochlorothiazide (MICROZIDE) 12.5 MG capsule Take 12.5 mg by mouth daily.    Marland Kitchen liothyronine (CYTOMEL) 25 MCG  tablet Take 25 mcg by mouth daily.    Marland Kitchen losartan (COZAAR) 100 MG tablet Take 100 mg by mouth daily.    . Multiple Vitamins-Minerals (CENTRUM SILVER 50+MEN) TABS Take 1 tablet by mouth daily.    Marland Kitchen oxyCODONE-acetaminophen (PERCOCET) 10-325 MG tablet Take 1 tablet by mouth every 4 (four) hours as needed for pain.    . pantoprazole (PROTONIX) 40 MG tablet Take 40 mg by mouth every evening.     . Probiotic Product (PROBIOTIC DAILY PO) Take 1  capsule by mouth daily.    . prochlorperazine (COMPAZINE) 10 MG tablet TAKE ONE TABLET EVERY 6 HOURS AS NEEDED 30 tablet 2  . rosuvastatin (CRESTOR) 5 MG tablet Take 5 mg by mouth at bedtime.    . sildenafil (VIAGRA) 100 MG tablet Take 100 mg by mouth daily as needed for erectile dysfunction.     No current facility-administered medications for this visit.    SURGICAL HISTORY:  Past Surgical History:  Procedure Laterality Date  . CHOLECYSTECTOMY     2019  . FRACTURE SURGERY     bil legs (logging [place])  . IR RADIOLOGIST EVAL & MGMT  09/27/2018  . IR RADIOLOGIST EVAL & MGMT  11/22/2018  . IR RADIOLOGIST EVAL & MGMT  04/10/2019  . NECK SURGERY  1994-1995  . RADIOLOGY WITH ANESTHESIA Left 10/31/2018   Procedure: CT WITH ANESTHESIA RENAL CRYO ABLATION;  Surgeon: Aletta Edouard, MD;  Location: [facility];  Service: Radiology;  Laterality: Left;    REVIEW OF SYSTEMS:   Review of Systems Constitutional:Positive for fatigue and generalized weakness.Negative for appetite change, chills, fever and unexpected weight change.  HENT: Negative for mouth sores, nosebleeds, sore throat and trouble swallowing.  Eyes: Negative for eye problems and icterus.  Respiratory:Positive for chronic cough and shortness of breath.Negative for hemoptysis and wheezing.  Cardiovascular:Positive for bilateral lower extremity swelling.Negative for chest pain. Gastrointestinal:Negative for abdominal pain, constipation, diarrhea, nausea and vomiting.  Genitourinary: Negative for bladder incontinence, difficulty urinating, dysuria, frequency and hematuria.  Musculoskeletal:Positive for chronic neck and back pain secondary to an old injury. Skin:Positive for multiple scabs on his extremities. Neurological: Negative for dizziness, extremity weakness, gait problem, headaches, light-headedness and seizures.  Hematological: Negative for adenopathy. Does not bruise/bleed easily.  Psychiatric/Behavioral: Negative  for confusion, depression and sleep disturbance. The patient is not nervous/anxious.   PHYSICAL EXAMINATION:  Blood pressure (!) 128/59, pulse 71, temperature 98.1 F (36.7 C), temperature source Tympanic, resp. rate 18, SpO2 98 %.  ECOG PERFORMANCE STATUS: 2-3  Physical Exam  Constitutional: Oriented to person, place, and time andappears older than stated age.No acute distress. HENT:  Head: Normocephalic and atraumatic.  Mouth/Throat: Oropharynx is clear and moist. No oropharyngeal exudate.  Eyes: Conjunctivae are normal. Right eye exhibits no discharge. Left eye exhibits no discharge. No scleral icterus.  Neck: Normal range of motion. Neck supple.  Cardiovascular: Normal rate, regular rhythm, normal heart sounds and intact distal pulses.  Pulmonary/Chest: Effort normal.Mild rhonchi which cleared with coughing. No respiratory distress. No rales.  Abdominal: Soft. Bowel sounds are normal. Exhibits no distension and no mass. There is no tenderness.  Musculoskeletal: Bilateral lower extremity edema.Examined in the wheelchair. Lymphadenopathy:  No cervical adenopathy.  Neurological: Alert and oriented to person, place, and time. Exhibits muscle wasting.  Skin: Skin is warm and dry. Telangiectasias on chestChronic venous insufficiency in lower extremities. Several scabs on the patient's extremities. Not diaphoretic. No pallor.  Psychiatric: Mood, memory and judgment normal.  Vitals reviewed.  LABORATORY DATA:  Lab Results  Component Value Date   WBC 7.0 07/01/2020   HGB 13.4 07/01/2020   HCT 40.6 07/01/2020   MCV 83.2 07/01/2020   PLT 242 07/01/2020      Chemistry      Component Value Date/Time   NA 138 07/01/2020 1001   K 4.5 07/01/2020 1001   CL 103 07/01/2020 1001   CO2 25 07/01/2020 1001   BUN 14 07/01/2020 1001   CREATININE 1.11 07/01/2020 1001      Component Value Date/Time   CALCIUM 8.8 (L) 07/01/2020 1001   ALKPHOS 98 07/01/2020 1001   AST 16  07/01/2020 1001   ALT 11 07/01/2020 1001   BILITOT 0.5 07/01/2020 1001       RADIOGRAPHIC STUDIES:  CT Chest W Contrast  Result Date: 06/03/2020 CLINICAL DATA:  Primary Cancer Type: Lung Imaging Indication: Assess response to therapy Interval therapy since last imaging? Yes Initial Cancer Diagnosis Date: 07/2017; Established by: Biopsy-proven Detailed Pathology: Stage Ib non-small cell lung cancer, adenosquamous carcinoma. Primary Tumor location: Left upper lobe. Recurrence? Yes; Date(s) of recurrence: 09/11/2019; Established by: Imaging only; Stage IIIa non-small cell lung cancer. Surgeries: Cholecystectomy. Chemotherapy: Yes; Ongoing? No; Most recent administration: 11/12/2019 Immunotherapy?  Yes; Type: Imfinzi; Ongoing? Yes Radiation therapy? Yes Date Range: 10/01/2019 - 11/13/2019; Target: Left mediastinum Date Range: 06/26/2018 - 07/06/2018; Target: Left suprahilar region Date Range: 08/30/2017 - 09/08/2017; Target: Left upper lobe Other Cancer Therapies: Cryoablation of left renal cell carcinoma 10/31/2018. EXAM: CT CHEST WITH CONTRAST TECHNIQUE: Multidetector CT imaging of the chest was performed during intravenous contrast administration. CONTRAST:  80mL OMNIPAQUE IOHEXOL 300 MG/ML  SOLN COMPARISON:  Most recent CT chest 04/06/2020.  09/17/2019 PET-CT. FINDINGS: Cardiovascular: Normal heart size. No significant pericardial effusion/thickening. Three-vessel coronary atherosclerosis. Atherosclerotic nonaneurysmal thoracic aorta. Dilated main pulmonary artery (3.5 cm diameter), stable. No central pulmonary emboli. Mediastinum/Nodes: Stable heterogeneous hypodense 3.4 cm right thyroid nodule. Unremarkable esophagus. No pathologically enlarged axillary, mediastinal or hilar lymph nodes. Previously treated hypermetabolic left prevascular node measures 0.5 cm short axis diameter (series 2/image 46), decreased from 0.8 cm. Lungs/Pleura: No pneumothorax. No pleural effusion. Moderate centrilobular and  paraseptal emphysema with diffuse bronchial wall thickening. Irregular bandlike patchy sharply marginated left perihilar lung consolidation predominantly involving the left upper lobe with associated volume loss and distortion, not appreciably changed, compatible with radiation fibrosis. No acute consolidative airspace disease or new significant pulmonary nodules. Upper abdomen: Cholecystectomy. Simple 1.2 cm medial upper right renal cyst is unchanged. Stable mild thickening of the bilateral adrenal glands without discrete adrenal nodules. Musculoskeletal: No aggressive appearing focal osseous lesions. Marked thoracic spondylosis. IMPRESSION: 1. Stable radiation fibrosis in the left upper lobe. No evidence of local tumor recurrence. 2. No evidence of recurrent metastatic disease in the chest. Previously treated left prevascular node has decreased in size and is now normal in size. 3. Stable dilated main pulmonary artery, suggesting chronic pulmonary arterial hypertension. 4. Aortic Atherosclerosis (ICD10-I70.0) and Emphysema (ICD10-J43.9). Electronically Signed   By: Ilona Sorrel M.D.   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 Diagnosed in 2019. 2)stage Ia3(T1cN0M0)non-small cell lung cancer. Diagnosed with a left suprahilar region with AP window adenopathy. Status post SBRT 3)renal cell carcinoma status post CT-guided ablation in July 2020. Followed by Dr. [name]. 4)recurrent non-small cell lung cancer in July 2021. Patient presented with new adenopathy in  the AP window.Not accessible to biopsy.  The patientcompletedconcurrent chemoradiation with carboplatin for an AUC of 2 and paclitaxel 45 mg/m2. He is status post6cycles.His last dose of treatment was on 11/12/19.  The patient is currently undergoing consolidation  immunotherapy with Imfinzi 1500 mg IV every 4 weeks. He is status post 7 cycles and has been tolerating it well.    Labs were reviewed. Recommend he proceed with cycle #8 today as scheduled.   We will see him back for a follow up visit in 4 weeks for evaluation before starting cycle #9.   Reassured the patient that the skin changes on his chest were telangiectasias which are not harmful or cancerous.   For his allergies, advised to take mucinex if needed or anti-histamine such as zyrtec or claritin.   The patient was advised to call immediately if he has any concerning symptoms in the interval. The patient voices understanding of current disease status and treatment options and is in agreement with the current care plan. All questions were answered. The patient knows to call the clinic with any problems, questions or concerns. We can certainly see the patient much sooner if necessary       No orders of the defined types were placed in this encounter.    I spent 20-29 minutes in this encounter.   Sehar Sedano L Andrius Andrepont, PA-C 07/01/20

## 2020-07-01 ENCOUNTER — Inpatient Hospital Stay: Payer: Medicare Other | Admitting: General Practice

## 2020-07-01 ENCOUNTER — Inpatient Hospital Stay: Payer: Medicare Other | Attending: Physician Assistant | Admitting: Physician Assistant

## 2020-07-01 ENCOUNTER — Inpatient Hospital Stay: Payer: Medicare Other

## 2020-07-01 ENCOUNTER — Other Ambulatory Visit: Payer: Self-pay

## 2020-07-01 VITALS — BP 128/59 | HR 71 | Temp 98.1°F | Resp 18

## 2020-07-01 DIAGNOSIS — C3492 Malignant neoplasm of unspecified part of left bronchus or lung: Secondary | ICD-10-CM

## 2020-07-01 DIAGNOSIS — C3412 Malignant neoplasm of upper lobe, left bronchus or lung: Secondary | ICD-10-CM | POA: Insufficient documentation

## 2020-07-01 DIAGNOSIS — Z5112 Encounter for antineoplastic immunotherapy: Secondary | ICD-10-CM | POA: Insufficient documentation

## 2020-07-01 DIAGNOSIS — Z79899 Other long term (current) drug therapy: Secondary | ICD-10-CM | POA: Insufficient documentation

## 2020-07-01 LAB — CBC WITH DIFFERENTIAL (CANCER CENTER ONLY)
Abs Immature Granulocytes: 0.02 10*3/uL (ref 0.00–0.07)
Basophils Absolute: 0.1 10*3/uL (ref 0.0–0.1)
Basophils Relative: 1 %
Eosinophils Absolute: 0.2 10*3/uL (ref 0.0–0.5)
Eosinophils Relative: 3 %
HCT: 40.6 % (ref 39.0–52.0)
Hemoglobin: 13.4 g/dL (ref 13.0–17.0)
Immature Granulocytes: 0 %
Lymphocytes Relative: 18 %
Lymphs Abs: 1.3 10*3/uL (ref 0.7–4.0)
MCH: 27.5 pg (ref 26.0–34.0)
MCHC: 33 g/dL (ref 30.0–36.0)
MCV: 83.2 fL (ref 80.0–100.0)
Monocytes Absolute: 0.7 10*3/uL (ref 0.1–1.0)
Monocytes Relative: 10 %
Neutro Abs: 4.8 10*3/uL (ref 1.7–7.7)
Neutrophils Relative %: 68 %
Platelet Count: 242 10*3/uL (ref 150–400)
RBC: 4.88 MIL/uL (ref 4.22–5.81)
RDW: 17 % — ABNORMAL HIGH (ref 11.5–15.5)
WBC Count: 7 10*3/uL (ref 4.0–10.5)
nRBC: 0 % (ref 0.0–0.2)

## 2020-07-01 LAB — CMP (CANCER CENTER ONLY)
ALT: 11 U/L (ref 0–44)
AST: 16 U/L (ref 15–41)
Albumin: 3.4 g/dL — ABNORMAL LOW (ref 3.5–5.0)
Alkaline Phosphatase: 98 U/L (ref 38–126)
Anion gap: 10 (ref 5–15)
BUN: 14 mg/dL (ref 8–23)
CO2: 25 mmol/L (ref 22–32)
Calcium: 8.8 mg/dL — ABNORMAL LOW (ref 8.9–10.3)
Chloride: 103 mmol/L (ref 98–111)
Creatinine: 1.11 mg/dL (ref 0.61–1.24)
GFR, Estimated: >60 mL/min (ref >60)
Glucose, Bld: 105 mg/dL — ABNORMAL HIGH (ref 70–99)
Potassium: 4.5 mmol/L (ref 3.5–5.1)
Sodium: 138 mmol/L (ref 135–145)
Total Bilirubin: 0.5 mg/dL (ref 0.3–1.2)
Total Protein: 7.1 g/dL (ref 6.5–8.1)

## 2020-07-01 LAB — TSH: TSH: 3.569 u[IU]/mL (ref 0.320–4.118)

## 2020-07-01 MED ORDER — SODIUM CHLORIDE 0.9 % IV SOLN
Freq: Once | INTRAVENOUS | Status: AC
  Filled 2020-07-01: qty 250

## 2020-07-01 MED ORDER — SODIUM CHLORIDE 0.9 % IV SOLN
1500.0000 mg | Freq: Once | INTRAVENOUS | Status: AC
  Administered 2020-07-01: 1500 mg via INTRAVENOUS
  Filled 2020-07-01: qty 30

## 2020-07-01 NOTE — Patient Instructions (Signed)
[facility] ONCOLOGY  Discharge Instructions: Thank you for choosing [facility] to provide your oncology and hematology care.   If you have a lab appointment with the [facility], please go directly to the [facility] and check in at the registration area.   Wear comfortable clothing and clothing appropriate for easy access to any Portacath or PICC line.   We strive to give you quality time with your provider. You may need to reschedule your appointment if you arrive late (15 or more minutes).  Arriving late affects you and other patients whose appointments are after yours.  Also, if you miss three or more appointments without notifying the office, you may be dismissed from the clinic at the provider's discretion.      For prescription refill requests, have your pharmacy contact our office and allow 72 hours for refills to be completed.    Today you received the following chemotherapy and/or immunotherapy agents Durvalumab(Imfinzi)     To help prevent nausea and vomiting after your treatment, we encourage you to take your nausea medication as directed.  BELOW ARE SYMPTOMS THAT SHOULD BE REPORTED IMMEDIATELY: . *FEVER GREATER THAN 100.4 F (38 C) OR HIGHER . *CHILLS OR SWEATING . *NAUSEA AND VOMITING THAT IS NOT CONTROLLED WITH YOUR NAUSEA MEDICATION . *UNUSUAL SHORTNESS OF BREATH . *UNUSUAL BRUISING OR BLEEDING . *URINARY PROBLEMS (pain or burning when urinating, or frequent urination) . *BOWEL PROBLEMS (unusual diarrhea, constipation, pain near the anus) . TENDERNESS IN MOUTH AND THROAT WITH OR WITHOUT PRESENCE OF ULCERS (sore throat, sores in mouth, or a toothache) . UNUSUAL RASH, SWELLING OR PAIN  . UNUSUAL VAGINAL DISCHARGE OR ITCHING   Items with * indicate a potential emergency and should be followed up as soon as possible or go to the Emergency Department if any problems should occur.  Please show the CHEMOTHERAPY ALERT CARD or  IMMUNOTHERAPY ALERT CARD at check-in to the Emergency Department and triage nurse.  Should you have questions after your visit or need to cancel or reschedule your appointment, please contact [facility]  Dept: [phone]  and follow the prompts.  Office hours are 8:00 a.m. to 4:30 p.m. Monday - Friday. Please note that voicemails left after 4:00 p.m. may not be returned until the following business day.  We are closed weekends and major holidays. You have access to a nurse at all times for urgent questions. Please call the main number to the clinic Dept: [phone] and follow the prompts.   For any non-urgent questions, you may also contact your provider using MyChart. We now offer e-Visits for anyone 67 and older to request care online for non-urgent symptoms. For details visit [email or web address].   Also download the MyChart app! Go to the app store, search "MyChart", open the app, select [facility], and log in with your MyChart username and password.  Due to Covid, a mask is required upon entering the hospital/clinic. If you do not have a mask, one will be given to you upon arrival. For doctor visits, patients may have 1 support person aged 65 or older with them. For treatment visits, patients cannot have anyone with them due to current Covid guidelines and our immunocompromised population.

## 2020-07-01 NOTE — Progress Notes (Signed)
[facility] CSW Progress Notes  Met w patient in infusion to follow up on needs re utilities and special assistance from [place].  He asks that I call his sister as "she handles all my affairs."  Called sister, "he still has power so the restriction is over."  He has been approved for the [employer].  Sister encouraged to call [place] [facility] to apply for Special Assistance funds to support patient remaining at home.  This will allow assignment of a case worker from [place] as well as additional resources to support patient in the home.  Edwyna Shell, LCSW Clinical Social Worker Phone:  [phone]

## 2020-07-29 ENCOUNTER — Other Ambulatory Visit: Payer: Self-pay

## 2020-07-29 ENCOUNTER — Inpatient Hospital Stay: Payer: Medicare Other

## 2020-07-29 ENCOUNTER — Encounter: Payer: Self-pay | Admitting: Internal Medicine

## 2020-07-29 ENCOUNTER — Inpatient Hospital Stay (HOSPITAL_BASED_OUTPATIENT_CLINIC_OR_DEPARTMENT_OTHER): Payer: Medicare Other | Admitting: Internal Medicine

## 2020-07-29 ENCOUNTER — Inpatient Hospital Stay: Payer: Medicare Other | Attending: Internal Medicine

## 2020-07-29 VITALS — BP 131/72 | HR 82 | Temp 97.9°F | Resp 20 | Ht 74.0 in

## 2020-07-29 DIAGNOSIS — C3490 Malignant neoplasm of unspecified part of unspecified bronchus or lung: Secondary | ICD-10-CM

## 2020-07-29 DIAGNOSIS — Z79899 Other long term (current) drug therapy: Secondary | ICD-10-CM | POA: Insufficient documentation

## 2020-07-29 DIAGNOSIS — Z5112 Encounter for antineoplastic immunotherapy: Secondary | ICD-10-CM | POA: Diagnosis not present

## 2020-07-29 DIAGNOSIS — C3492 Malignant neoplasm of unspecified part of left bronchus or lung: Secondary | ICD-10-CM

## 2020-07-29 DIAGNOSIS — C3412 Malignant neoplasm of upper lobe, left bronchus or lung: Secondary | ICD-10-CM

## 2020-07-29 LAB — CBC WITH DIFFERENTIAL (CANCER CENTER ONLY)
Abs Immature Granulocytes: 0.02 10*3/uL (ref 0.00–0.07)
Basophils Absolute: 0.1 10*3/uL (ref 0.0–0.1)
Basophils Relative: 1 %
Eosinophils Absolute: 0.3 10*3/uL (ref 0.0–0.5)
Eosinophils Relative: 4 %
HCT: 42.2 % (ref 39.0–52.0)
Hemoglobin: 13.5 g/dL (ref 13.0–17.0)
Immature Granulocytes: 0 %
Lymphocytes Relative: 22 %
Lymphs Abs: 1.5 10*3/uL (ref 0.7–4.0)
MCH: 26.7 pg (ref 26.0–34.0)
MCHC: 32 g/dL (ref 30.0–36.0)
MCV: 83.6 fL (ref 80.0–100.0)
Monocytes Absolute: 0.5 10*3/uL (ref 0.1–1.0)
Monocytes Relative: 7 %
Neutro Abs: 4.5 10*3/uL (ref 1.7–7.7)
Neutrophils Relative %: 66 %
Platelet Count: 223 10*3/uL (ref 150–400)
RBC: 5.05 MIL/uL (ref 4.22–5.81)
RDW: 16.9 % — ABNORMAL HIGH (ref 11.5–15.5)
WBC Count: 6.9 10*3/uL (ref 4.0–10.5)
nRBC: 0 % (ref 0.0–0.2)

## 2020-07-29 LAB — CMP (CANCER CENTER ONLY)
ALT: 10 U/L (ref 0–44)
AST: 14 U/L — ABNORMAL LOW (ref 15–41)
Albumin: 3.1 g/dL — ABNORMAL LOW (ref 3.5–5.0)
Alkaline Phosphatase: 95 U/L (ref 38–126)
Anion gap: 11 (ref 5–15)
BUN: 14 mg/dL (ref 8–23)
CO2: 21 mmol/L — ABNORMAL LOW (ref 22–32)
Calcium: 8.9 mg/dL (ref 8.9–10.3)
Chloride: 106 mmol/L (ref 98–111)
Creatinine: 1.12 mg/dL (ref 0.61–1.24)
GFR, Estimated: >60 mL/min (ref >60)
Glucose, Bld: 110 mg/dL — ABNORMAL HIGH (ref 70–99)
Potassium: 4.2 mmol/L (ref 3.5–5.1)
Sodium: 138 mmol/L (ref 135–145)
Total Bilirubin: 0.5 mg/dL (ref 0.3–1.2)
Total Protein: 6.9 g/dL (ref 6.5–8.1)

## 2020-07-29 LAB — TSH: TSH: 4.028 u[IU]/mL (ref 0.320–4.118)

## 2020-07-29 MED ORDER — SODIUM CHLORIDE 0.9 % IV SOLN
1500.0000 mg | Freq: Once | INTRAVENOUS | Status: AC
  Administered 2020-07-29: 1500 mg via INTRAVENOUS
  Filled 2020-07-29: qty 30

## 2020-07-29 MED ORDER — SODIUM CHLORIDE 0.9 % IV SOLN
Freq: Once | INTRAVENOUS | Status: AC
  Filled 2020-07-29: qty 250

## 2020-07-29 NOTE — Patient Instructions (Signed)
[facility] ONCOLOG[facility]  Discharge Instructions: Thank you for choosing [facility] to provide your oncology and hematology care.   If you have a lab appointment with the [facility], please go directly to the [facility] and check in at the registration area.   Wear comfortable clothing and clothing appropriate for easy access to any Portacath or PICC line.   We strive to give you quality time with your provider. You may need to reschedule your appointment if you arrive late (15 or more minutes).  Arriving late affects you and other patients whose appointments are after yours.  Also, if you miss three or more appointments without notifying the office, you may be dismissed from the clinic at the provider's discretion.      For prescription refill requests, have your pharmacy contact our office and allow 72 hours for refills to be completed.    Today you received the following chemotherapy and/or immunotherapy agents Durvalumab(Imfinzi)     To help prevent nausea and vomiting after your treatment, we encourage you to take your nausea medication as directed.  BELOW ARE SYMPTOMS THAT SHOULD BE REPORTED IMMEDIATELY: . *FEVER GREATER THAN 100.4 F (38 C) OR HIGHER . *CHILLS OR SWEATING . *NAUSEA AND VOMITING THAT IS NOT CONTROLLED WITH YOUR NAUSEA MEDICATION . *UNUSUAL SHORTNESS OF BREATH . *UNUSUAL BRUISING OR BLEEDING . *URINARY PROBLEMS (pain or burning when urinating, or frequent urination) . *BOWEL PROBLEMS (unusual diarrhea, constipation, pain near the anus) . TENDERNESS IN MOUTH AND THROAT WITH OR WITHOUT PRESENCE OF ULCERS (sore throat, sores in mouth, or a toothache) . UNUSUAL RASH, SWELLING OR PAIN  . UNUSUAL VAGINAL DISCHARGE OR ITCHING   Items with * indicate a potential emergency and should be followed up as soon as possible or go to the Emergency Department if any problems should occur.  Please show the CHEMOTHERAPY ALERT CARD or  IMMUNOTHERAPY ALERT CARD at check-in to the Emergency Department and triage nurse.  Should you have questions after your visit or need to cancel or reschedule your appointment, please contact [facility]  Dept: [phone]  and follow the prompts.  Office hours are 8:00 a.m. to 4:30 p.m. Monday - Friday. Please note that voicemails left after 4:00 p.m. may not be returned until the following business day.  We are closed weekends and major holidays. You have access to a nurse at all times for urgent questions. Please call the main number to the clinic Dept: [phone] and follow the prompts.   For any non-urgent questions, you may also contact your provider using MyChart. We now offer e-Visits for anyone 48 and older to request care online for non-urgent symptoms. For details visit [email or web address].   Also download the MyChart app! Go to the app store, search "MyChart", open the app, select [facility], and log in with your MyChart username and password.  Due to Covid, a mask is required upon entering the hospital/clinic. If you do not have a mask, one will be given to you upon arrival. For doctor visits, patients may have 1 support person aged 69 or older with them. For treatment visits, patients cannot have anyone with them due to current Covid guidelines and our immunocompromised population.

## 2020-07-29 NOTE — Progress Notes (Signed)
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 Diagnosed in 2019. 2)stage Ia3(T1cN0M0)non-small cell lung cancer. Diagnosed with a left suprahilar region with AP window adenopathy. Status post SBRT 3)renal cell carcinoma status post CT-guided ablation in July 2020. Followed by Dr. [name]. 4)recurrent non-small cell lung cancer in July 2021. Patient presented with new adenopathy in the AP window.Inaccessible to biopsy  PRIOR THERAPY:  1) SBRT to the LUL nodule in 2019 under the care of Dr. Lisbeth Renshaw 2) SBRT to the left suprahilar mass under the care of Dr. Lisbeth Renshaw in March 2020 3) CT guided ablation to the renal cell carcinoma under the care of Dr. Kathlene Cote. 40 Concurrent chemoradiation with carboplatin for an AUC of 2 and paclitaxel 45 mg/m. First dose on 09/30/2019. Status post6cycles.  Last cycle was given on 11/12/2019 with partial response.  CURRENT THERAPY:  Consolidation treatment with immunotherapy with Imfinzi 1500 mg IV every 4 weeks.  First dose December 18, 2019.  Status post 8 cycles.  INTERVAL HISTORY: Reginald Berry 70 y.o. male returns to the clinic today for follow-up visit accompanied by his sister.  The patient is feeling fine today with no concerning complaints except for occasional shortness of breath with exertion.  He denied having any chest pain, cough or hemoptysis.  He denied having any fever or chills.  He has no nausea, vomiting, diarrhea or constipation.  He has no headache or visual changes.  He continues to tolerate his treatment with immunotherapy fairly well.  The patient is here today for evaluation before starting cycle #9.   MEDICAL HISTORY: Past Medical History:  Diagnosis Date  . Aortic atherosclerosis ([place])   .  Arthritis   . Chronic low back pain   . COPD (chronic obstructive pulmonary disease) ([place])   . Coronary artery calcification seen on CT scan    Multivessel  . Essential hypertension   . Gait instability    Previous logging accident  . GERD (gastroesophageal reflux disease)   . Lung cancer ([place])    Poorly differentiated, XRT  . Renal cell carcinoma ([facility])    Left, status post cryoablation August 2020    ALLERGIES:  has No Known Allergies.  MEDICATIONS:  Current Outpatient Medications  Medication Sig Dispense Refill  . ALPRAZolam (XANAX) 0.5 MG tablet Take 0.5 mg by mouth daily as needed.    Marland Kitchen amLODipine (NORVASC) 10 MG tablet Take 10 mg by mouth daily.    Marland Kitchen aspirin 81 MG EC tablet Take 81 mg by mouth daily. Swallow whole.    Marland Kitchen buPROPion (WELLBUTRIN XL) 300 MG 24 hr tablet Take 300 mg by mouth daily.    . busPIRone (BUSPAR) 15 MG tablet Take 15 mg by mouth daily.    . cetirizine (ZYRTEC) 10 MG tablet Take 10 mg by mouth daily as needed for allergies.    . furosemide (LASIX) 40 MG tablet Take 40 mg by mouth daily.     . hydrochlorothiazide (MICROZIDE) 12.5 MG capsule Take 12.5 mg by mouth daily.    Marland Kitchen liothyronine (CYTOMEL) 25 MCG tablet Take 25 mcg by mouth daily.    Marland Kitchen losartan (COZAAR) 100 MG tablet Take 100 mg by mouth daily.    . Multiple Vitamins-Minerals (CENTRUM SILVER 50+MEN) TABS Take 1 tablet by mouth  daily.    . oxyCODONE-acetaminophen (PERCOCET) 10-325 MG tablet Take 1 tablet by mouth every 4 (four) hours as needed for pain.    . pantoprazole (PROTONIX) 40 MG tablet Take 40 mg by mouth every evening.     . Probiotic Product (PROBIOTIC DAILY PO) Take 1 capsule by mouth daily.    . prochlorperazine (COMPAZINE) 10 MG tablet TAKE ONE TABLET EVERY 6 HOURS AS NEEDED 30 tablet 2  . rosuvastatin (CRESTOR) 5 MG tablet Take 5 mg by mouth at bedtime.    . sildenafil (VIAGRA) 100 MG tablet Take 100 mg by mouth daily as needed for erectile dysfunction.     No current  facility-administered medications for this visit.    SURGICAL HISTORY:  Past Surgical History:  Procedure Laterality Date  . CHOLECYSTECTOMY     2019  . FRACTURE SURGERY     bil legs (logging trees)  . IR RADIOLOGIST EVAL & MGMT  09/27/2018  . IR RADIOLOGIST EVAL & MGMT  11/22/2018  . IR RADIOLOGIST EVAL & MGMT  04/10/2019  . NECK SURGERY  1994-1995  . RADIOLOGY WITH ANESTHESIA Left 10/31/2018   Procedure: CT WITH ANESTHESIA RENAL CRYO ABLATION;  Surgeon: Aletta Edouard, MD;  Location: [facility];  Service: Radiology;  Laterality: Left;    REVIEW OF SYSTEMS:  A comprehensive review of systems was negative except for: Respiratory: positive for dyspnea on exertion   PHYSICAL EXAMINATION: General appearance: alert, cooperative and no distress Head: Normocephalic, without obvious abnormality, atraumatic Neck: no adenopathy, no JVD, supple, symmetrical, trachea midline and thyroid not enlarged, symmetric, no tenderness/mass/nodules Lymph nodes: Cervical, supraclavicular, and axillary nodes normal. Resp: clear to auscultation bilaterally Back: symmetric, no curvature. ROM normal. No CVA tenderness. Cardio: regular rate and rhythm, S1, S2 normal, no murmur, click, rub or gallop GI: soft, non-tender; bowel sounds normal; no masses,  no organomegaly Extremities: extremities normal, atraumatic, no cyanosis or edema  ECOG PERFORMANCE STATUS: 1 - Symptomatic but completely ambulatory  Blood pressure 131/72, pulse 82, temperature 97.9 F (36.6 C), temperature source Tympanic, resp. rate 20, height 6\' 2"  (1.88 m), SpO2 98 %.  LABORATORY DATA: Lab Results  Component Value Date   WBC 6.9 07/29/2020   HGB 13.5 07/29/2020   HCT 42.2 07/29/2020   MCV 83.6 07/29/2020   PLT 223 07/29/2020      Chemistry      Component Value Date/Time   NA 138 07/29/2020 0739   K 4.2 07/29/2020 0739   CL 106 07/29/2020 0739   CO2 21 (L) 07/29/2020 0739   BUN 14 07/29/2020 0739   CREATININE 1.12 07/29/2020 0739       Component Value Date/Time   CALCIUM 8.9 07/29/2020 0739   ALKPHOS 95 07/29/2020 0739   AST 14 (L) 07/29/2020 0739   ALT 10 07/29/2020 0739   BILITOT 0.5 07/29/2020 0739       RADIOGRAPHIC STUDIES: No results found.  ASSESSMENT AND PLAN: This is a very pleasant 70 years old white male with recurrent non-small cell lung cancer presenting as a stage IIIa with mediastinal lymphadenopathy in July 2021.  The patient has a history of early stage non-small cell lung cancer, adenosquamous carcinoma as stage Ib of the left upper lobe status post SBRT in 2019.  He also has a stage Ia3 non-small cell lung cancer status post SBRT to the left suprahilar region and AP window adenopathy. He completed a course of concurrent chemoradiation with weekly carboplatin and paclitaxel status post 6 cycles and tolerated his  treatment well. The patient is currently undergoing treatment with immunotherapy with Imfinzi 1500 mg IV every 4 weeks status post 8 cycles.   The patient continues to tolerate his treatment well with no concerning adverse effects. I recommended for him to proceed with cycle #9 today as planned. He will come back for follow-up visit in 4 weeks for evaluation with repeat CT scan of the chest for restaging of his disease. The patient was advised to call immediately if he has any concerning symptoms in the interval. The patient voices understanding of current disease status and treatment options and is in agreement with the current care plan. All questions were answered. The patient knows to call the clinic with any problems, questions or concerns. We can certainly see the patient much sooner if necessary.   Disclaimer: This note was dictated with voice recognition software. Similar sounding words can inadvertently be transcribed and may not be corrected upon review.

## 2020-07-29 NOTE — Patient Instructions (Signed)
Steps to Quit Smoking Smoking tobacco is the leading cause of preventable death. It can affect almost every organ in the body. Smoking puts you and people around you at risk for many serious, long-lasting (chronic) diseases. Quitting smoking can be hard, but it is one of the best things that you can do for your health. It is never too late to quit. How do I get ready to quit? When you decide to quit smoking, make a plan to help you succeed. Before you quit:  Pick a date to quit. Set a date within the next 2 weeks to give you time to prepare.  Write down the reasons why you are quitting. Keep this list in places where you will see it often.  Tell your family, friends, and co-workers that you are quitting. Their support is important.  Talk with your doctor about the choices that may help you quit.  Find out if your health insurance will pay for these treatments.  Know the people, places, things, and activities that make you want to smoke (triggers). Avoid them. What first steps can I take to quit smoking?  Throw away all cigarettes at home, at work, and in your car.  Throw away the things that you use when you smoke, such as ashtrays and lighters.  Clean your car. Make sure to empty the ashtray.  Clean your home, including curtains and carpets. What can I do to help me quit smoking? Talk with your doctor about taking medicines and seeing a counselor at the same time. You are more likely to succeed when you do both.  If you are pregnant or breastfeeding, talk with your doctor about counseling or other ways to quit smoking. Do not take medicine to help you quit smoking unless your doctor tells you to do so. To quit smoking: Quit right away  Quit smoking totally, instead of slowly cutting back on how much you smoke over a period of time.  Go to counseling. You are more likely to quit if you go to counseling sessions regularly. Take medicine You may take medicines to help you quit. Some  medicines need a prescription, and some you can buy over-the-counter. Some medicines may contain a drug called nicotine to replace the nicotine in cigarettes. Medicines may:  Help you to stop having the desire to smoke (cravings).  Help to stop the problems that come when you stop smoking (withdrawal symptoms). Your doctor may ask you to use:  Nicotine patches, gum, or lozenges.  Nicotine inhalers or sprays.  Non-nicotine medicine that is taken by mouth. Find resources Find resources and other ways to help you quit smoking and remain smoke-free after you quit. These resources are most helpful when you use them often. They include:  Online chats with a counselor.  Phone quitlines.  Printed self-help materials.  Support groups or group counseling.  Text messaging programs.  Mobile phone apps. Use apps on your mobile phone or tablet that can help you stick to your quit plan. There are many free apps for mobile phones and tablets as well as websites. Examples include Quit Guide from the CDC and smokefree.gov   What things can I do to make it easier to quit?  Talk to your family and friends. Ask them to support and encourage you.  Call a phone quitline (1-800-QUIT-NOW), reach out to support groups, or work with a counselor.  Ask people who smoke to not smoke around you.  Avoid places that make you want to smoke,   such as: ? Bars. ? Parties. ? Smoke-break areas at work.  Spend time with people who do not smoke.  Lower the stress in your life. Stress can make you want to smoke. Try these things to help your stress: ? Getting regular exercise. ? Doing deep-breathing exercises. ? Doing yoga. ? Meditating. ? Doing a body scan. To do this, close your eyes, focus on one area of your body at a time from head to toe. Notice which parts of your body are tense. Try to relax the muscles in those areas.   How will I feel when I quit smoking? Day 1 to 3 weeks Within the first 24 hours,  you may start to have some problems that come from quitting tobacco. These problems are very bad 2-3 days after you quit, but they do not often last for more than 2-3 weeks. You may get these symptoms:  Mood swings.  Feeling restless, nervous, angry, or annoyed.  Trouble concentrating.  Dizziness.  Strong desire for high-sugar foods and nicotine.  Weight gain.  Trouble pooping (constipation).  Feeling like you may vomit (nausea).  Coughing or a sore throat.  Changes in how the medicines that you take for other issues work in your body.  Depression.  Trouble sleeping (insomnia). Week 3 and afterward After the first 2-3 weeks of quitting, you may start to notice more positive results, such as:  Better sense of smell and taste.  Less coughing and sore throat.  Slower heart rate.  Lower blood pressure.  Clearer skin.  Better breathing.  Fewer sick days. Quitting smoking can be hard. Do not give up if you fail the first time. Some people need to try a few times before they succeed. Do your best to stick to your quit plan, and talk with your doctor if you have any questions or concerns. Summary  Smoking tobacco is the leading cause of preventable death. Quitting smoking can be hard, but it is one of the best things that you can do for your health.  When you decide to quit smoking, make a plan to help you succeed.  Quit smoking right away, not slowly over a period of time.  When you start quitting, seek help from your doctor, family, or friends. This information is not intended to replace advice given to you by your health care provider. Make sure you discuss any questions you have with your health care provider. Document Revised: 11/16/2018 Document Reviewed: 05/12/2018 Elsevier Patient Education  2021 [employer] Inc.  

## 2020-07-31 ENCOUNTER — Telehealth: Payer: Self-pay | Admitting: Internal Medicine

## 2020-07-31 NOTE — Telephone Encounter (Signed)
Scheduled per los. Called and left msg. Mailed printout  °

## 2020-08-03 DIAGNOSIS — M5137 Other intervertebral disc degeneration, lumbosacral region: Secondary | ICD-10-CM | POA: Diagnosis not present

## 2020-08-03 DIAGNOSIS — J449 Chronic obstructive pulmonary disease, unspecified: Secondary | ICD-10-CM | POA: Diagnosis not present

## 2020-08-03 DIAGNOSIS — I10 Essential (primary) hypertension: Secondary | ICD-10-CM | POA: Diagnosis not present

## 2020-08-03 DIAGNOSIS — E7849 Other hyperlipidemia: Secondary | ICD-10-CM | POA: Diagnosis not present

## 2020-08-03 DIAGNOSIS — Z720 Tobacco use: Secondary | ICD-10-CM | POA: Diagnosis not present

## 2020-08-17 ENCOUNTER — Other Ambulatory Visit: Payer: Self-pay

## 2020-08-17 ENCOUNTER — Ambulatory Visit (HOSPITAL_COMMUNITY)
Admission: RE | Admit: 2020-08-17 | Discharge: 2020-08-17 | Disposition: A | Discharge status: Home / Self Care | Payer: Medicare Other | Source: Ambulatory Visit | Attending: Internal Medicine | Admitting: Internal Medicine

## 2020-08-17 DIAGNOSIS — C3490 Malignant neoplasm of unspecified part of unspecified bronchus or lung: Secondary | ICD-10-CM | POA: Diagnosis not present

## 2020-08-17 DIAGNOSIS — C3412 Malignant neoplasm of upper lobe, left bronchus or lung: Secondary | ICD-10-CM | POA: Diagnosis not present

## 2020-08-17 DIAGNOSIS — I2510 Atherosclerotic heart disease of native coronary artery without angina pectoris: Secondary | ICD-10-CM | POA: Diagnosis not present

## 2020-08-17 DIAGNOSIS — I281 Aneurysm of pulmonary artery: Secondary | ICD-10-CM | POA: Diagnosis not present

## 2020-08-17 DIAGNOSIS — Z85528 Personal history of other malignant neoplasm of kidney: Secondary | ICD-10-CM | POA: Diagnosis not present

## 2020-08-17 MED ORDER — SODIUM CHLORIDE (PF) 0.9 % IJ SOLN
INTRAMUSCULAR | Status: AC
  Filled 2020-08-17: qty 50

## 2020-08-17 MED ORDER — IOHEXOL 300 MG/ML  SOLN
75.0000 mL | Freq: Once | INTRAMUSCULAR | Status: AC | PRN
  Administered 2020-08-17: 75 mL via INTRAVENOUS

## 2020-08-24 NOTE — Progress Notes (Signed)
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  Diagnosed in 2019. 2) stage Ia3 (T1cN0M0) non-small cell lung cancer.  Diagnosed with a left suprahilar region with AP window adenopathy.  Status post SBRT 3) renal cell carcinoma status post CT-guided ablation in July 2020.  Followed by Dr. [name]. 4) recurrent non-small cell lung cancer in July 2021.  Patient presented with new adenopathy in the AP window. Inaccessible to biopsy  PRIOR THERAPY: 1) SBRT to the LUL nodule in 2019 under the care of Dr. Lisbeth Renshaw 2) SBRT to the left suprahilar mass under the care of Dr. Lisbeth Renshaw in March 2020 3) CT guided ablation to the renal cell carcinoma under the care of Dr. Kathlene Cote. 4) Concurrent chemoradiation with carboplatin for an AUC of 2 and paclitaxel 45 mg/m.  Last cycle given on 11/12/19. Status post 6 cycles. He had a partial response to treatment  CURRENT THERAPY: Consolidation treatment with immunotherapy with Imfinzi 1500 mg IV every 4 weeks.  First dose December 18, 2019. Status post 9 cycles  INTERVAL HISTORY: Reginald Berry 70 y.o. male returns to the clinic for a follow up visit. The patient is feeling fairly well today without any concerning complaints. The patient continues to tolerate treatment with consolidation immunotherapy with Imfinzi well without any adverse side effects.The patient denies any fever, chills, night sweats, or weight loss. The patient takes lasix for swelling daily prescribed by his PCP. He has an appointment with him next week on 08/31/20. The patient is unsure if he has been gaining weight/fluid weight because he does not weight himself. The patient reports his baseline dyspnea on exertion with minimal exertion and chronic cough which produces clear phlegm. He also reports allergies for which he takes  claritin. The patient denies any chest pain or hemoptysis.  He denies any vomiting, or diarrhea. Denies recent constipation. He has chronic back pain and takes pain medication. He sometimes has nausea due to his pain medication use and is requesting a refill of his anti-emetic. He denies any headache or visual changes.  He denies any rashes or skin changes. He recently had a restaging CT scan of the chest. The patient is here today for evaluation and to review his scan results before starting cycle #10.   MEDICAL HISTORY: Past Medical History:  Diagnosis Date   Aortic atherosclerosis ([facility])    Arthritis    Chronic low back pain    COPD (chronic obstructive pulmonary disease) ([facility])    Coronary artery calcification seen on CT scan    Multivessel   Essential hypertension    Gait instability    Previous logging accident   GERD (gastroesophageal reflux disease)    Lung cancer ([place])    Poorly differentiated, XRT   Renal cell carcinoma ([place])    Left, status post cryoablation August 2020    ALLERGIES:  has No Known Allergies.  MEDICATIONS:  Current Outpatient Medications  Medication Sig Dispense Refill   doxycycline (VIBRA-TABS) 100 MG tablet Take 1 tablet (100 mg total) by mouth 2 (two) times daily. 20 tablet 0   ALPRAZolam (XANAX) 0.5 MG tablet Take 0.5 mg by mouth daily as needed.     amLODipine (NORVASC) 10 MG tablet Take 10 mg by mouth daily.     aspirin 81 MG EC tablet Take 81 mg by mouth  daily. Swallow whole.     buPROPion (WELLBUTRIN XL) 300 MG 24 hr tablet Take 300 mg by mouth daily.     busPIRone (BUSPAR) 15 MG tablet Take 15 mg by mouth daily.     cetirizine (ZYRTEC) 10 MG tablet Take 10 mg by mouth daily as needed for allergies.     furosemide (LASIX) 40 MG tablet Take 40 mg by mouth daily.      hydrochlorothiazide (MICROZIDE) 12.5 MG capsule Take 12.5 mg by mouth daily.     liothyronine (CYTOMEL) 25 MCG tablet Take 25 mcg by mouth daily.     losartan (COZAAR) 100 MG tablet  Take 100 mg by mouth daily.     Multiple Vitamins-Minerals (CENTRUM SILVER 50+MEN) TABS Take 1 tablet by mouth daily.     oxyCODONE-acetaminophen (PERCOCET) 10-325 MG tablet Take 1 tablet by mouth every 4 (four) hours as needed for pain.     pantoprazole (PROTONIX) 40 MG tablet Take 40 mg by mouth every evening.      Probiotic Product (PROBIOTIC DAILY PO) Take 1 capsule by mouth daily.     prochlorperazine (COMPAZINE) 10 MG tablet TAKE ONE TABLET EVERY 6 HOURS AS NEEDED 30 tablet 2   rosuvastatin (CRESTOR) 5 MG tablet Take 5 mg by mouth at bedtime.     sildenafil (VIAGRA) 100 MG tablet Take 100 mg by mouth daily as needed for erectile dysfunction.     No current facility-administered medications for this visit.    SURGICAL HISTORY:  Past Surgical History:  Procedure Laterality Date   CHOLECYSTECTOMY     2019   FRACTURE SURGERY     bil legs (logging trees)   IR RADIOLOGIST EVAL & MGMT  09/27/2018   IR RADIOLOGIST EVAL & MGMT  11/22/2018   IR RADIOLOGIST EVAL & MGMT  04/10/2019   NECK SURGERY  1994-1995   RADIOLOGY WITH ANESTHESIA Left 10/31/2018   Procedure: CT WITH ANESTHESIA RENAL CRYO ABLATION;  Surgeon: Aletta Edouard, MD;  Location: [facility];  Service: Radiology;  Laterality: Left;    REVIEW OF SYSTEMS:   Review of Systems  Constitutional: Positive for fatigue and generalized weakness.  Negative for appetite change, chills, fever and unexpected weight change. HENT: Negative for mouth sores, nosebleeds, sore throat and trouble swallowing.   Eyes: Negative for eye problems and icterus. Respiratory: Positive for chronic cough and shortness of breath.  Negative for hemoptysis and wheezing.   Cardiovascular: Positive for bilateral lower extremity swelling. Negative for chest pain. Gastrointestinal: Positive for nausea with his pain medication. Negative for abdominal pain, constipation, diarrhea, and vomiting. Genitourinary: Negative for bladder incontinence, difficulty urinating, dysuria,  frequency and hematuria.   Musculoskeletal: Positive for chronic neck and back pain secondary to an old injury.   Skin: Positive for multiple scabs on his extremities. Neurological: Negative for dizziness, extremity weakness, gait problem, headaches, light-headedness and seizures. Hematological: Negative for adenopathy. Does not bruise/bleed easily. Psychiatric/Behavioral: Negative for confusion, depression and sleep disturbance. The patient is not nervous/anxious.   PHYSICAL EXAMINATION:  Blood pressure 140/80, pulse 85, temperature 97.9 F (36.6 C), temperature source Tympanic, resp. rate 19, height 6\' 2"  (1.88 m), SpO2 95 %.  ECOG PERFORMANCE STATUS: 3  Physical Exam  Constitutional: Oriented to person, place, and time and appears older than stated age.  No acute distress. HENT: Head: Normocephalic and atraumatic. Mouth/Throat: Oropharynx is clear and moist. No oropharyngeal exudate. Eyes: Conjunctivae are normal. Right eye exhibits no discharge. Left eye exhibits no discharge. No scleral icterus. Neck:  Normal range of motion. Neck supple. Cardiovascular: Normal rate, regular rhythm, normal heart sounds and intact distal pulses.   Pulmonary/Chest: Effort normal. Bilateral rhonchi. No respiratory distress. No rales. Abdominal: Soft. Bowel sounds are normal. Exhibits no distension and no mass. There is no tenderness.  Musculoskeletal:  Bilateral lower extremity edema. Examined in the wheelchair. Lymphadenopathy:    No cervical adenopathy.  Neurological: Alert and oriented to person, place, and time.  Exhibits muscle wasting. Skin: Skin is warm and dry. Telangiectasias on chest  Chronic venous insufficiency in lower extremities.  Several scabs on the patient's extremities.  Not diaphoretic. No pallor.  Psychiatric: Mood, memory and judgment normal. Vitals reviewed.  LABORATORY DATA: Lab Results  Component Value Date   WBC 7.0 08/26/2020   HGB 13.4 08/26/2020   HCT 41.3 08/26/2020    MCV 81.1 08/26/2020   PLT 274 08/26/2020      Chemistry      Component Value Date/Time   NA 136 08/26/2020 0804   K 4.2 08/26/2020 0804   CL 104 08/26/2020 0804   CO2 22 08/26/2020 0804   BUN 21 08/26/2020 0804   CREATININE 1.19 08/26/2020 0804      Component Value Date/Time   CALCIUM 9.0 08/26/2020 0804   ALKPHOS 90 08/26/2020 0804   AST 16 08/26/2020 0804   ALT 13 08/26/2020 0804   BILITOT 0.6 08/26/2020 0804       RADIOGRAPHIC STUDIES:  CT Chest W Contrast  Result Date: 08/17/2020 CLINICAL DATA:  Stage I left upper lobe non-small cell lung cancer status post SBRT in 2019, with separate putative central left upper lobe stage I lung cancer status post SBRT completed 07/06/2018, subsequent left mediastinal nodal recurrence status post radiation therapy completed 11/13/2019. Left renal cell carcinoma status post cryoablation 10/31/2018. Restaging. EXAM: CT CHEST WITH CONTRAST TECHNIQUE: Multidetector CT imaging of the chest was performed during intravenous contrast administration. CONTRAST:  67mL OMNIPAQUE IOHEXOL 300 MG/ML  SOLN COMPARISON:  06/02/2020 chest CT. FINDINGS: Cardiovascular: Normal heart size. No significant pericardial effusion/thickening. Three-vessel coronary atherosclerosis. Atherosclerotic nonaneurysmal thoracic aorta. Stable dilated main pulmonary artery (3.5 cm diameter). No central pulmonary emboli. Mediastinum/Nodes: Stable asymmetric enlargement of the right thyroid lobe without discrete thyroid nodules by CT. Unremarkable esophagus. No pathologically enlarged axillary, mediastinal or hilar lymph nodes. Lungs/Pleura: No pneumothorax. No pleural effusion. Moderate centrilobular and paraseptal emphysema with diffuse bronchial wall thickening. New 3.8 x 2.2 cm irregular peribronchovascular focus of consolidation with surrounding ground-glass opacity in the right lower lobe (series 5/image 102). Thick irregular bandlike consolidation in perihilar and posterior left  upper lobe with associated volume loss and distortion, unchanged, compatible with postradiation change. No significant pulmonary nodules. Upper abdomen: Cholecystectomy. Simple 1.8 cm upper right renal cyst. Left adrenal 2.6 cm nodule with density 14 HU, and right adrenal 2.1 cm nodule with density 6 HU, both stable since 05/21/2018 PET-CT, where they were non hypermetabolic, compatible with bilateral adrenal adenomas. Musculoskeletal: No aggressive appearing focal osseous lesions. Mild bilateral gynecomastia, unchanged. Moderate thoracic spondylosis. IMPRESSION: 1. New 3.8 cm irregular peribronchovascular focus of consolidation with surrounding ground-glass opacity in the right lower lobe. Pneumonia is favored over metastatic disease. Follow-up post treatment chest CT recommended in 3 months. 2. Stable postradiation change in the left upper lobe with no evidence of local tumor recurrence. 3. Otherwise no potential findings of metastatic disease in the chest. No recurrent thoracic adenopathy. 4. Stable dilated main pulmonary artery, suggesting chronic pulmonary arterial hypertension. 5. Three-vessel coronary atherosclerosis. 6. Stable bilateral  adrenal adenomas. 7. Aortic Atherosclerosis (ICD10-I70.0) and Emphysema (ICD10-J43.9). These results will be called to the ordering clinician or representative by the Radiologist Assistant, and communication documented in the PACS or [employer]. Electronically Signed   By: Reginald Berry M.D.   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  Diagnosed in 2019. 2) stage Ia3 (T1cN0M0) non-small cell lung cancer.  Diagnosed with a left suprahilar region with AP window adenopathy.  Status post SBRT 3) renal cell carcinoma status post CT-guided ablation in July 2020.  Followed by Dr. [name]. 4) recurrent  non-small cell lung cancer in July 2021.  Patient presented with new adenopathy in the AP window. Not accessible to biopsy.   The patient completed concurrent chemoradiation with carboplatin for an AUC of 2 and paclitaxel 45 mg/m2. He is status post 6 cycles. His last dose of treatment was on 11/12/19.  The patient is currently undergoing consolidation immunotherapy with Imfinzi 1500 mg IV every 4 weeks.  He is status post 9 cycles and has been tolerating it well.  The patient recently had a restaging CT scan of the chest, abdomen, and pelvis performed.  Dr. Julien Nordmann personally and independently reviewed the scan discussed results with the patient today.  The scan did not show any evidence of disease progression but showed some increased consolidation which is concerning for pneumonia.  Dr. Julien Nordmann recommends that we prescribe doxycycline 100 mg BID to his pharmacy.  I have refilled his compazine today.   Unclear if the patient is gaining fluid weight due to the patient refusing weights. He takes lasix daily. Recommend he continue with his lasix and see his PCP to discuss if he needs to increase his dose due to his swelling. He has an appointment with them on 08/31/20.   He will  proceed with cycle #10 today scheduled.  We will see him back for follow-up visit in 4 weeks before starting cycle #11.  The patient was advised to call immediately if he has any concerning symptoms in the interval. The patient voices understanding of current disease status and treatment options and is in agreement with the current care plan. All questions were answered. The patient knows to call the clinic with any problems, questions or concerns. We can certainly see the patient much sooner if necessary  No orders of the defined types were placed in this encounter.   Jumar Greenstreet L Teagan Heidrick, PA-C 08/26/20  ADDENDUM: Hematology/Oncology Attending: I had a face-to-face encounter with the patient today.  I reviewed  his records, lab and scan and recommended his care plan.  This is a very pleasant 70 years old white male with recurrent non-small cell lung cancer diagnosed in July 2021 status post a course of concurrent chemoradiation with weekly carboplatin and paclitaxel for 6 cycles with partial response.  The patient is currently undergoing consolidation treatment with immunotherapy with Imfinzi 1500 Mg IV every 4 weeks status post 9 cycles.  He has been tolerating this treatment well with no concerning adverse effects.  The patient had repeat CT scan of the chest performed recently.  I personally and independently reviewed the scans and discussed the results with the patient and his sister.  His scan showed no concerning findings for disease progression. I recommended for the patient to continue his current treatment with consolidation Imfinzi and he  will proceed with cycle #10 today. For the swelling of the lower extremities, the patient will continue his current treatment with Lasix and reach out to his primary care physician for adjustment of his doses. The patient will come back for follow-up visit in 4 weeks for evaluation before the next cycle of his treatment. The patient was advised to call immediately if he has any concerning symptoms in the interval. total time spent in the appointment was 30 minutes. Disclaimer: This note was dictated with voice recognition software. Similar sounding words can inadvertently be transcribed and may be missed upon review. Eilleen Kempf, MD 08/26/20

## 2020-08-26 ENCOUNTER — Inpatient Hospital Stay (HOSPITAL_BASED_OUTPATIENT_CLINIC_OR_DEPARTMENT_OTHER): Payer: Medicare Other | Admitting: Physician Assistant

## 2020-08-26 ENCOUNTER — Inpatient Hospital Stay: Payer: Medicare Other

## 2020-08-26 ENCOUNTER — Other Ambulatory Visit: Payer: Self-pay

## 2020-08-26 ENCOUNTER — Inpatient Hospital Stay: Payer: Medicare Other | Attending: Physician Assistant

## 2020-08-26 VITALS — BP 140/80 | HR 85 | Temp 97.9°F | Resp 19 | Ht 74.0 in

## 2020-08-26 DIAGNOSIS — Z5112 Encounter for antineoplastic immunotherapy: Secondary | ICD-10-CM | POA: Insufficient documentation

## 2020-08-26 DIAGNOSIS — Z79899 Other long term (current) drug therapy: Secondary | ICD-10-CM | POA: Insufficient documentation

## 2020-08-26 DIAGNOSIS — C3412 Malignant neoplasm of upper lobe, left bronchus or lung: Secondary | ICD-10-CM | POA: Insufficient documentation

## 2020-08-26 DIAGNOSIS — C3492 Malignant neoplasm of unspecified part of left bronchus or lung: Secondary | ICD-10-CM

## 2020-08-26 DIAGNOSIS — J189 Pneumonia, unspecified organism: Secondary | ICD-10-CM

## 2020-08-26 LAB — CBC WITH DIFFERENTIAL (CANCER CENTER ONLY)
Abs Immature Granulocytes: 0.03 10*3/uL (ref 0.00–0.07)
Basophils Absolute: 0.1 10*3/uL (ref 0.0–0.1)
Basophils Relative: 1 %
Eosinophils Absolute: 0.4 10*3/uL (ref 0.0–0.5)
Eosinophils Relative: 6 %
HCT: 41.3 % (ref 39.0–52.0)
Hemoglobin: 13.4 g/dL (ref 13.0–17.0)
Immature Granulocytes: 0 %
Lymphocytes Relative: 16 %
Lymphs Abs: 1.1 10*3/uL (ref 0.7–4.0)
MCH: 26.3 pg (ref 26.0–34.0)
MCHC: 32.4 g/dL (ref 30.0–36.0)
MCV: 81.1 fL (ref 80.0–100.0)
Monocytes Absolute: 0.5 10*3/uL (ref 0.1–1.0)
Monocytes Relative: 7 %
Neutro Abs: 5 10*3/uL (ref 1.7–7.7)
Neutrophils Relative %: 70 %
Platelet Count: 274 10*3/uL (ref 150–400)
RBC: 5.09 MIL/uL (ref 4.22–5.81)
RDW: 16.9 % — ABNORMAL HIGH (ref 11.5–15.5)
WBC Count: 7 10*3/uL (ref 4.0–10.5)
nRBC: 0 % (ref 0.0–0.2)

## 2020-08-26 LAB — CMP (CANCER CENTER ONLY)
ALT: 13 U/L (ref 0–44)
AST: 16 U/L (ref 15–41)
Albumin: 3.1 g/dL — ABNORMAL LOW (ref 3.5–5.0)
Alkaline Phosphatase: 90 U/L (ref 38–126)
Anion gap: 10 (ref 5–15)
BUN: 21 mg/dL (ref 8–23)
CO2: 22 mmol/L (ref 22–32)
Calcium: 9 mg/dL (ref 8.9–10.3)
Chloride: 104 mmol/L (ref 98–111)
Creatinine: 1.19 mg/dL (ref 0.61–1.24)
GFR, Estimated: >60 mL/min (ref >60)
Glucose, Bld: 126 mg/dL — ABNORMAL HIGH (ref 70–99)
Potassium: 4.2 mmol/L (ref 3.5–5.1)
Sodium: 136 mmol/L (ref 135–145)
Total Bilirubin: 0.6 mg/dL (ref 0.3–1.2)
Total Protein: 7 g/dL (ref 6.5–8.1)

## 2020-08-26 LAB — TSH: TSH: 5.161 u[IU]/mL — ABNORMAL HIGH (ref 0.320–4.118)

## 2020-08-26 MED ORDER — SODIUM CHLORIDE 0.9 % IV SOLN
Freq: Once | INTRAVENOUS | Status: AC
  Filled 2020-08-26: qty 250

## 2020-08-26 MED ORDER — DOXYCYCLINE HYCLATE 100 MG PO TABS: 100.0000 mg | ORAL_TABLET | Freq: Two times a day (BID) | ORAL | 0 refills | Status: DC

## 2020-08-26 MED ORDER — SODIUM CHLORIDE 0.9 % IV SOLN
1500.0000 mg | Freq: Once | INTRAVENOUS | Status: AC
  Administered 2020-08-26: 1500 mg via INTRAVENOUS
  Filled 2020-08-26: qty 30

## 2020-08-26 MED ORDER — PROCHLORPERAZINE MALEATE 10 MG PO TABS: ORAL_TABLET | ORAL | 2 refills | Status: DC

## 2020-08-26 NOTE — Patient Instructions (Signed)
[facility]  Discharge Instructions: °Thank you for choosing [facility] to provide your oncology and hematology care.  ° °If you have a lab appointment with the Cancer Center, please go directly to the Cancer Center and check in at the registration area. °  °Wear comfortable clothing and clothing appropriate for easy access to any Portacath or PICC line.  ° °We strive to give you quality time with your provider. You may need to reschedule your appointment if you arrive late (15 or more minutes).  Arriving late affects you and other patients whose appointments are after yours.  Also, if you miss three or more appointments without notifying the office, you may be dismissed from the clinic at the provider’s discretion.    °  °For prescription refill requests, have your pharmacy contact our office and allow 72 hours for refills to be completed.   ° °Today you received the following chemotherapy and/or immunotherapy agents :  Durvalumab °    °  °To help prevent nausea and vomiting after your treatment, we encourage you to take your nausea medication as directed. ° °BELOW ARE SYMPTOMS THAT SHOULD BE REPORTED IMMEDIATELY: °*FEVER GREATER THAN 100.4 F (38 °C) OR HIGHER °*CHILLS OR SWEATING °*NAUSEA AND VOMITING THAT IS NOT CONTROLLED WITH YOUR NAUSEA MEDICATION °*UNUSUAL SHORTNESS OF BREATH °*UNUSUAL BRUISING OR BLEEDING °*URINARY PROBLEMS (pain or burning when urinating, or frequent urination) °*BOWEL PROBLEMS (unusual diarrhea, constipation, pain near the anus) °TENDERNESS IN MOUTH AND THROAT WITH OR WITHOUT PRESENCE OF ULCERS (sore throat, sores in mouth, or a toothache) °UNUSUAL RASH, SWELLING OR PAIN  °UNUSUAL VAGINAL DISCHARGE OR ITCHING  ° °Items with * indicate a potential emergency and should be followed up as soon as possible or go to the Emergency Department if any problems should occur. ° °Please show the CHEMOTHERAPY ALERT CARD or IMMUNOTHERAPY ALERT CARD at  check-in to the Emergency Department and triage nurse. ° °Should you have questions after your visit or need to cancel or reschedule your appointment, please contact [facility]  Dept: [phone]  and follow the prompts.  Office hours are 8:00 a.m. to 4:30 p.m. Monday - Friday. Please note that voicemails left after 4:00 p.m. may not be returned until the following business day.  We are closed weekends and major holidays. You have access to a nurse at all times for urgent questions. Please call the main number to the clinic Dept: [phone] and follow the prompts. ° ° °For any non-urgent questions, you may also contact your provider using MyChart. We now offer e-Visits for anyone 18 and older to request care online for non-urgent symptoms. For details visit [email or web address]. °  °Also download the MyChart app! Go to the app store, search "MyChart", open the app, select [facility], and log in with your MyChart username and password. ° °Due to Covid, a mask is required upon entering the hospital/clinic. If you do not have a mask, one will be given to you upon arrival. For doctor visits, patients may have 1 support person aged 18 or older with them. For treatment visits, patients cannot have anyone with them due to current Covid guidelines and our immunocompromised population.  ° °

## 2020-08-31 DIAGNOSIS — C649 Malignant neoplasm of unspecified kidney, except renal pelvis: Secondary | ICD-10-CM | POA: Diagnosis not present

## 2020-08-31 DIAGNOSIS — J449 Chronic obstructive pulmonary disease, unspecified: Secondary | ICD-10-CM | POA: Diagnosis not present

## 2020-08-31 DIAGNOSIS — I714 Abdominal aortic aneurysm, without rupture: Secondary | ICD-10-CM | POA: Diagnosis not present

## 2020-08-31 DIAGNOSIS — I10 Essential (primary) hypertension: Secondary | ICD-10-CM | POA: Diagnosis not present

## 2020-08-31 DIAGNOSIS — Z7189 Other specified counseling: Secondary | ICD-10-CM | POA: Diagnosis not present

## 2020-08-31 DIAGNOSIS — F17210 Nicotine dependence, cigarettes, uncomplicated: Secondary | ICD-10-CM | POA: Diagnosis not present

## 2020-08-31 DIAGNOSIS — E7849 Other hyperlipidemia: Secondary | ICD-10-CM | POA: Diagnosis not present

## 2020-09-23 ENCOUNTER — Other Ambulatory Visit: Payer: Self-pay

## 2020-09-23 ENCOUNTER — Inpatient Hospital Stay: Payer: Medicare Other

## 2020-09-23 ENCOUNTER — Inpatient Hospital Stay: Payer: Medicare Other | Attending: Internal Medicine | Admitting: Internal Medicine

## 2020-09-23 ENCOUNTER — Encounter: Payer: Self-pay | Admitting: General Practice

## 2020-09-23 VITALS — BP 105/58 | HR 73 | Temp 97.9°F | Resp 18

## 2020-09-23 DIAGNOSIS — C3492 Malignant neoplasm of unspecified part of left bronchus or lung: Secondary | ICD-10-CM

## 2020-09-23 DIAGNOSIS — Z79899 Other long term (current) drug therapy: Secondary | ICD-10-CM | POA: Diagnosis not present

## 2020-09-23 DIAGNOSIS — Z5112 Encounter for antineoplastic immunotherapy: Secondary | ICD-10-CM | POA: Diagnosis not present

## 2020-09-23 DIAGNOSIS — C3412 Malignant neoplasm of upper lobe, left bronchus or lung: Secondary | ICD-10-CM | POA: Insufficient documentation

## 2020-09-23 LAB — TSH: TSH: 1.348 u[IU]/mL (ref 0.320–4.118)

## 2020-09-23 LAB — CMP (CANCER CENTER ONLY)
ALT: 13 U/L (ref 0–44)
AST: 16 U/L (ref 15–41)
Albumin: 3 g/dL — ABNORMAL LOW (ref 3.5–5.0)
Alkaline Phosphatase: 96 U/L (ref 38–126)
Anion gap: 6 (ref 5–15)
BUN: 15 mg/dL (ref 8–23)
CO2: 23 mmol/L (ref 22–32)
Calcium: 8.9 mg/dL (ref 8.9–10.3)
Chloride: 105 mmol/L (ref 98–111)
Creatinine: 1.18 mg/dL (ref 0.61–1.24)
GFR, Estimated: >60 mL/min (ref >60)
Glucose, Bld: 116 mg/dL — ABNORMAL HIGH (ref 70–99)
Potassium: 3.9 mmol/L (ref 3.5–5.1)
Sodium: 134 mmol/L — ABNORMAL LOW (ref 135–145)
Total Bilirubin: 0.6 mg/dL (ref 0.3–1.2)
Total Protein: 7.1 g/dL (ref 6.5–8.1)

## 2020-09-23 LAB — CBC WITH DIFFERENTIAL (CANCER CENTER ONLY)
Abs Immature Granulocytes: 0.02 10*3/uL (ref 0.00–0.07)
Basophils Absolute: 0 10*3/uL (ref 0.0–0.1)
Basophils Relative: 1 %
Eosinophils Absolute: 0.1 10*3/uL (ref 0.0–0.5)
Eosinophils Relative: 2 %
HCT: 40.8 % (ref 39.0–52.0)
Hemoglobin: 13.4 g/dL (ref 13.0–17.0)
Immature Granulocytes: 0 %
Lymphocytes Relative: 21 %
Lymphs Abs: 1.2 10*3/uL (ref 0.7–4.0)
MCH: 27 pg (ref 26.0–34.0)
MCHC: 32.8 g/dL (ref 30.0–36.0)
MCV: 82.1 fL (ref 80.0–100.0)
Monocytes Absolute: 0.7 10*3/uL (ref 0.1–1.0)
Monocytes Relative: 13 %
Neutro Abs: 3.5 10*3/uL (ref 1.7–7.7)
Neutrophils Relative %: 63 %
Platelet Count: 220 10*3/uL (ref 150–400)
RBC: 4.97 MIL/uL (ref 4.22–5.81)
RDW: 17.6 % — ABNORMAL HIGH (ref 11.5–15.5)
WBC Count: 5.5 10*3/uL (ref 4.0–10.5)
nRBC: 0 % (ref 0.0–0.2)

## 2020-09-23 MED ORDER — DURVALUMAB 500 MG/10ML IV SOLN
1500.0000 mg | Freq: Once | INTRAVENOUS | Status: AC
  Administered 2020-09-23: 1500 mg via INTRAVENOUS
  Filled 2020-09-23: qty 30

## 2020-09-23 MED ORDER — SODIUM CHLORIDE 0.9 % IV SOLN
Freq: Once | INTRAVENOUS | Status: AC
  Filled 2020-09-23: qty 250

## 2020-09-23 NOTE — Progress Notes (Signed)
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  Diagnosed in 2019. 2) stage Ia3 (T1cN0M0) non-small cell lung cancer.  Diagnosed with a left suprahilar region with AP window adenopathy.  Status post SBRT 3) renal cell carcinoma status post CT-guided ablation in July 2020.  Followed by Dr. [name]. 4) recurrent non-small cell lung cancer in July 2021.  Patient presented with new adenopathy in the AP window. Inaccessible to biopsy   PRIOR THERAPY:  1) SBRT to the LUL nodule in 2019 under the care of Dr. Lisbeth Renshaw 2) SBRT to the left suprahilar mass under the care of Dr. Lisbeth Renshaw in March 2020 3) CT guided ablation to the renal cell carcinoma under the care of Dr. Kathlene Cote. 40 Concurrent chemoradiation with carboplatin for an AUC of 2 and paclitaxel 45 mg/m.  First dose on 09/30/2019.  Status post 6 cycles.  Last cycle was given on 11/12/2019 with partial response.   CURRENT THERAPY:  Consolidation treatment with immunotherapy with Imfinzi 1500 mg IV every 4 weeks.  First dose December 18, 2019.  Status post 10 cycles.   INTERVAL HISTORY: Reginald Berry 70 y.o. male returns to the clinic today for follow-up visit accompanied by his sister.  The patient is feeling fine today with no concerning complaints except for fatigue and weight gain from fluid retention.  He is currently on Lasix prescribed by his cardiologist.  He denied having any current chest pain but has shortness of breath with exertion with no cough or hemoptysis.  He denied having any fever or chills.  He has no nausea, vomiting, diarrhea or constipation.  He has no headache or visual changes.  He is here today for evaluation before starting cycle #11.   MEDICAL HISTORY: Past Medical History:  Diagnosis Date   Aortic  atherosclerosis (HCC)    Arthritis    Chronic low back pain    COPD (chronic obstructive pulmonary disease) (HCC)    Coronary artery calcification seen on CT scan    Multivessel   Essential hypertension    Gait instability    Previous logging accident   GERD (gastroesophageal reflux disease)    Lung cancer ([facility])    Poorly differentiated, XRT   Renal cell carcinoma ([facility])    Left, status post cryoablation August 2020    ALLERGIES:  has No Known Allergies.  MEDICATIONS:  Current Outpatient Medications  Medication Sig Dispense Refill   ALPRAZolam (XANAX) 0.5 MG tablet Take 0.5 mg by mouth daily as needed.     amLODipine (NORVASC) 10 MG tablet Take 10 mg by mouth daily.     aspirin 81 MG EC tablet Take 81 mg by mouth daily. Swallow whole.     buPROPion (WELLBUTRIN XL) 300 MG 24 hr tablet Take 300 mg by mouth daily.     busPIRone (BUSPAR) 15 MG tablet Take 15 mg by mouth daily.     cetirizine (ZYRTEC) 10 MG tablet Take 10 mg by mouth daily as needed for allergies.     doxycycline (VIBRA-TABS) 100 MG tablet Take 1 tablet (100 mg total) by mouth 2 (two) times daily. 20 tablet 0   furosemide (LASIX) 40 MG tablet Take 40 mg by mouth daily.      hydrochlorothiazide (MICROZIDE) 12.5 MG capsule Take  12.5 mg by mouth daily.     liothyronine (CYTOMEL) 25 MCG tablet Take 25 mcg by mouth daily.     losartan (COZAAR) 100 MG tablet Take 100 mg by mouth daily.     Multiple Vitamins-Minerals (CENTRUM SILVER 50+MEN) TABS Take 1 tablet by mouth daily.     oxyCODONE-acetaminophen (PERCOCET) 10-325 MG tablet Take 1 tablet by mouth every 4 (four) hours as needed for pain.     pantoprazole (PROTONIX) 40 MG tablet Take 40 mg by mouth every evening.      Probiotic Product (PROBIOTIC DAILY PO) Take 1 capsule by mouth daily.     prochlorperazine (COMPAZINE) 10 MG tablet TAKE ONE TABLET EVERY 6 HOURS AS NEEDED 30 tablet 2   rosuvastatin (CRESTOR) 5 MG tablet Take 5 mg by mouth at bedtime.     sildenafil  (VIAGRA) 100 MG tablet Take 100 mg by mouth daily as needed for erectile dysfunction.     No current facility-administered medications for this visit.    SURGICAL HISTORY:  Past Surgical History:  Procedure Laterality Date   CHOLECYSTECTOMY     2019   FRACTURE SURGERY     bil legs (logging trees)   IR RADIOLOGIST EVAL & MGMT  09/27/2018   IR RADIOLOGIST EVAL & MGMT  11/22/2018   IR RADIOLOGIST EVAL & MGMT  04/10/2019   NECK SURGERY  1994-1995   RADIOLOGY WITH ANESTHESIA Left 10/31/2018   Procedure: CT WITH ANESTHESIA RENAL CRYO ABLATION;  Surgeon: Aletta Edouard, MD;  Location: [facility];  Service: Radiology;  Laterality: Left;    REVIEW OF SYSTEMS:  A comprehensive review of systems was negative except for: Constitutional: positive for fatigue Respiratory: positive for dyspnea on exertion   PHYSICAL EXAMINATION: General appearance: alert, cooperative, fatigued, and no distress Head: Normocephalic, without obvious abnormality, atraumatic Neck: no adenopathy, no JVD, supple, symmetrical, trachea midline, and thyroid not enlarged, symmetric, no tenderness/mass/nodules Lymph nodes: Cervical, supraclavicular, and axillary nodes normal. Resp: clear to auscultation bilaterally Back: symmetric, no curvature. ROM normal. No CVA tenderness. Cardio: regular rate and rhythm, S1, S2 normal, no murmur, click, rub or gallop GI: soft, non-tender; bowel sounds normal; no masses,  no organomegaly Extremities: extremities normal, atraumatic, no cyanosis or edema  ECOG PERFORMANCE STATUS: 1 - Symptomatic but completely ambulatory  Blood pressure (!) 105/58, pulse 73, temperature 97.9 F (36.6 C), temperature source Oral, resp. rate 18, SpO2 97 %.  LABORATORY DATA: Lab Results  Component Value Date   WBC 5.5 09/23/2020   HGB 13.4 09/23/2020   HCT 40.8 09/23/2020   MCV 82.1 09/23/2020   PLT 220 09/23/2020      Chemistry      Component Value Date/Time   NA 136 08/26/2020 0804   K 4.2  08/26/2020 0804   CL 104 08/26/2020 0804   CO2 22 08/26/2020 0804   BUN 21 08/26/2020 0804   CREATININE 1.19 08/26/2020 0804      Component Value Date/Time   CALCIUM 9.0 08/26/2020 0804   ALKPHOS 90 08/26/2020 0804   AST 16 08/26/2020 0804   ALT 13 08/26/2020 0804   BILITOT 0.6 08/26/2020 0804       RADIOGRAPHIC STUDIES: No results found.  ASSESSMENT AND PLAN: This is a very pleasant 70 years old white male with recurrent non-small cell lung cancer presenting as a stage IIIa with mediastinal lymphadenopathy in July 2021.  The patient has a history of early stage non-small cell lung cancer, adenosquamous carcinoma as stage Ib of the left  upper lobe status post SBRT in 2019.  He also has a stage Ia3 non-small cell lung cancer status post SBRT to the left suprahilar region and AP window adenopathy. He completed a course of concurrent chemoradiation with weekly carboplatin and paclitaxel status post 6 cycles and tolerated his treatment well. The patient is currently undergoing treatment with immunotherapy with Imfinzi 1500 mg IV every 4 weeks status post 10 cycles.   The patient continues to tolerate his treatment well with no concerning adverse effect except for fatigue and shortness of breath secondary to fluid retention from congestive heart failure.  He is currently on Lasix. I recommended for him to proceed with cycle #11 today as planned. I will see him back for follow-up visit in 4 weeks for evaluation before the next cycle of his treatment. The patient was advised to call immediately if he has any concerning symptoms in the interval. The patient voices understanding of current disease status and treatment options and is in agreement with the current care plan. All questions were answered. The patient knows to call the clinic with any problems, questions or concerns. We can certainly see the patient much sooner if necessary.   Disclaimer: This note was dictated with voice recognition  software. Similar sounding words can inadvertently be transcribed and may not be corrected upon review.

## 2020-09-23 NOTE — Progress Notes (Signed)
[place] [facility] CSW Progress Notes  Spoke w sister, she requested gas card but he is out of [employer].  Will send in [place] ([employer]) gas card application.  They are covered under his Medicaid for transportation reimbursement, but per sister, the Medicaid allowable reimbursement does not cover the cost of transport to/from appointments.  CSW will send his application to [employer].  He was also referred to [facility] [employer] for their special assistance program which provides extra support for patients to remain in their homes.  CSW will speak w his caseworker, Sandrea Matte, [phone]). Left VM and asked that she call me back.  Edwyna Shell, LCSW Clinical Social Worker Phone:  [phone]

## 2020-09-23 NOTE — Patient Instructions (Signed)
[facility]  Discharge Instructions: Thank you for choosing [facility] to provide your oncology and hematology care.   If you have a lab appointment with the Cancer Center, please go directly to the Cancer Center and check in at the registration area.   Wear comfortable clothing and clothing appropriate for easy access to any Portacath or PICC line.   We strive to give you quality time with your provider. You may need to reschedule your appointment if you arrive late (15 or more minutes).  Arriving late affects you and other patients whose appointments are after yours.  Also, if you miss three or more appointments without notifying the office, you may be dismissed from the clinic at the provider's discretion.      For prescription refill requests, have your pharmacy contact our office and allow 72 hours for refills to be completed.    Today you received the following chemotherapy and/or immunotherapy agents imfinzi       To help prevent nausea and vomiting after your treatment, we encourage you to take your nausea medication as directed.  BELOW ARE SYMPTOMS THAT SHOULD BE REPORTED IMMEDIATELY: *FEVER GREATER THAN 100.4 F (38 C) OR HIGHER *CHILLS OR SWEATING *NAUSEA AND VOMITING THAT IS NOT CONTROLLED WITH YOUR NAUSEA MEDICATION *UNUSUAL SHORTNESS OF BREATH *UNUSUAL BRUISING OR BLEEDING *URINARY PROBLEMS (pain or burning when urinating, or frequent urination) *BOWEL PROBLEMS (unusual diarrhea, constipation, pain near the anus) TENDERNESS IN MOUTH AND THROAT WITH OR WITHOUT PRESENCE OF ULCERS (sore throat, sores in mouth, or a toothache) UNUSUAL RASH, SWELLING OR PAIN  UNUSUAL VAGINAL DISCHARGE OR ITCHING   Items with * indicate a potential emergency and should be followed up as soon as possible or go to the Emergency Department if any problems should occur.  Please show the CHEMOTHERAPY ALERT CARD or IMMUNOTHERAPY ALERT CARD at check-in to  the Emergency Department and triage nurse.  Should you have questions after your visit or need to cancel or reschedule your appointment, please contact [facility]  Dept: [phone]  and follow the prompts.  Office hours are 8:00 a.m. to 4:30 p.m. Monday - Friday. Please note that voicemails left after 4:00 p.m. may not be returned until the following business day.  We are closed weekends and major holidays. You have access to a nurse at all times for urgent questions. Please call the main number to the clinic Dept: [phone] and follow the prompts.   For any non-urgent questions, you may also contact your provider using MyChart. We now offer e-Visits for anyone 18 and older to request care online for non-urgent symptoms. For details visit [email or web address].   Also download the MyChart app! Go to the app store, search "MyChart", open the app, select [facility], and log in with your MyChart username and password.  Due to Covid, a mask is required upon entering the hospital/clinic. If you do not have a mask, one will be given to you upon arrival. For doctor visits, patients may have 1 support person aged 18 or older with them. For treatment visits, patients cannot have anyone with them due to current Covid guidelines and our immunocompromised population.   

## 2020-09-24 ENCOUNTER — Encounter: Payer: Self-pay | Admitting: General Practice

## 2020-09-24 NOTE — Progress Notes (Signed)
[facility] [facility] CSW Progress Notes  Call from [place], [place] [phone]) is SW managing his in home case.  Confirms he is getting a small amount of additional funds for in home care.  These are to be used to provide extra items that might make it possible for patient to continue to live at home - amount is predetermined, not based on specific needs.  Says sister is managing his funds and has not contacted him w any concerns.  [place] is making a home visit next week and will provide an update.   Edwyna Shell, LCSW Clinical Social Worker Phone:  [phone]

## 2020-09-29 ENCOUNTER — Telehealth: Payer: Self-pay | Admitting: Internal Medicine

## 2020-09-29 NOTE — Telephone Encounter (Signed)
Scheduled per los. Called and left msg. Mailed printout  °

## 2020-10-08 ENCOUNTER — Encounter (HOSPITAL_COMMUNITY): Payer: Self-pay

## 2020-10-08 ENCOUNTER — Ambulatory Visit (HOSPITAL_COMMUNITY)
Admission: RE | Admit: 2020-10-08 | Discharge: 2020-10-08 | Disposition: A | Discharge status: Home / Self Care | Payer: Medicare Other | Source: Ambulatory Visit | Attending: Interventional Radiology | Admitting: Interventional Radiology

## 2020-10-08 ENCOUNTER — Other Ambulatory Visit: Payer: Self-pay

## 2020-10-08 DIAGNOSIS — Z85528 Personal history of other malignant neoplasm of kidney: Secondary | ICD-10-CM | POA: Diagnosis not present

## 2020-10-08 DIAGNOSIS — I700 Atherosclerosis of aorta: Secondary | ICD-10-CM | POA: Diagnosis not present

## 2020-10-08 DIAGNOSIS — N2889 Other specified disorders of kidney and ureter: Secondary | ICD-10-CM | POA: Insufficient documentation

## 2020-10-08 DIAGNOSIS — Z85118 Personal history of other malignant neoplasm of bronchus and lung: Secondary | ICD-10-CM | POA: Diagnosis not present

## 2020-10-08 DIAGNOSIS — N281 Cyst of kidney, acquired: Secondary | ICD-10-CM | POA: Diagnosis not present

## 2020-10-08 MED ORDER — IOHEXOL 350 MG/ML SOLN
100.0000 mL | Freq: Once | INTRAVENOUS | Status: AC | PRN
  Administered 2020-10-08: 80 mL via INTRAVENOUS

## 2020-10-11 DIAGNOSIS — H5213 Myopia, bilateral: Secondary | ICD-10-CM | POA: Diagnosis not present

## 2020-10-12 ENCOUNTER — Encounter: Payer: Self-pay | Admitting: *Deleted

## 2020-10-12 NOTE — Progress Notes (Signed)
[facility] Clinical Social Work  Web designer to [employer] Access to [place], MSW, LCSW, OSW-C Clinical Social Worker [place] [phone]

## 2020-10-13 ENCOUNTER — Ambulatory Visit
Admission: RE | Admit: 2020-10-13 | Discharge: 2020-10-13 | Disposition: A | Discharge status: Home / Self Care | Payer: Medicare Other | Source: Ambulatory Visit | Attending: Interventional Radiology | Admitting: Interventional Radiology

## 2020-10-13 ENCOUNTER — Other Ambulatory Visit: Payer: Self-pay

## 2020-10-13 ENCOUNTER — Encounter: Payer: Self-pay | Admitting: *Deleted

## 2020-10-13 DIAGNOSIS — N2889 Other specified disorders of kidney and ureter: Secondary | ICD-10-CM

## 2020-10-13 DIAGNOSIS — Z9889 Other specified postprocedural states: Secondary | ICD-10-CM | POA: Diagnosis not present

## 2020-10-13 HISTORY — PX: IR RADIOLOGIST EVAL & MGMT: IMG5224

## 2020-10-14 ENCOUNTER — Telehealth: Payer: Self-pay | Admitting: General Practice

## 2020-10-14 NOTE — Telephone Encounter (Signed)
[place] CSW Progress Notes  Call from sister Clint Biello - she is concerned about gas money to bring him to upcoming [facility] appointment.  CSW resubmitted his [employer] gas card application last week - it can only be submitted every 4 months.  They will receive card in the mail.  Per record, he has no funds left on [employer].  CSW can provide Benay Spice fund if patient has not used up this grant.  Edwyna Shell, LCSW Clinical Social Worker Phone:  [phone]

## 2020-10-21 ENCOUNTER — Encounter: Payer: Self-pay | Admitting: Internal Medicine

## 2020-10-21 ENCOUNTER — Inpatient Hospital Stay: Payer: Medicare Other | Attending: Internal Medicine

## 2020-10-21 ENCOUNTER — Encounter: Payer: Self-pay | Admitting: General Practice

## 2020-10-21 ENCOUNTER — Inpatient Hospital Stay (HOSPITAL_BASED_OUTPATIENT_CLINIC_OR_DEPARTMENT_OTHER): Payer: Medicare Other | Admitting: Internal Medicine

## 2020-10-21 ENCOUNTER — Inpatient Hospital Stay: Payer: Medicare Other

## 2020-10-21 ENCOUNTER — Inpatient Hospital Stay: Payer: Medicare Other | Admitting: *Deleted

## 2020-10-21 ENCOUNTER — Other Ambulatory Visit: Payer: Self-pay

## 2020-10-21 VITALS — BP 119/64 | HR 82 | Temp 97.4°F | Resp 20 | Ht 74.0 in

## 2020-10-21 DIAGNOSIS — Z5112 Encounter for antineoplastic immunotherapy: Secondary | ICD-10-CM | POA: Insufficient documentation

## 2020-10-21 DIAGNOSIS — C3492 Malignant neoplasm of unspecified part of left bronchus or lung: Secondary | ICD-10-CM

## 2020-10-21 DIAGNOSIS — C3412 Malignant neoplasm of upper lobe, left bronchus or lung: Secondary | ICD-10-CM | POA: Diagnosis not present

## 2020-10-21 DIAGNOSIS — Z79899 Other long term (current) drug therapy: Secondary | ICD-10-CM | POA: Insufficient documentation

## 2020-10-21 LAB — CBC WITH DIFFERENTIAL (CANCER CENTER ONLY)
Abs Immature Granulocytes: 0.04 10*3/uL (ref 0.00–0.07)
Basophils Absolute: 0.1 10*3/uL (ref 0.0–0.1)
Basophils Relative: 1 %
Eosinophils Absolute: 0.2 10*3/uL (ref 0.0–0.5)
Eosinophils Relative: 3 %
HCT: 41.9 % (ref 39.0–52.0)
Hemoglobin: 13.8 g/dL (ref 13.0–17.0)
Immature Granulocytes: 1 %
Lymphocytes Relative: 17 %
Lymphs Abs: 1.3 10*3/uL (ref 0.7–4.0)
MCH: 26.7 pg (ref 26.0–34.0)
MCHC: 32.9 g/dL (ref 30.0–36.0)
MCV: 81.2 fL (ref 80.0–100.0)
Monocytes Absolute: 0.6 10*3/uL (ref 0.1–1.0)
Monocytes Relative: 8 %
Neutro Abs: 5.5 10*3/uL (ref 1.7–7.7)
Neutrophils Relative %: 70 %
Platelet Count: 265 10*3/uL (ref 150–400)
RBC: 5.16 MIL/uL (ref 4.22–5.81)
RDW: 17.3 % — ABNORMAL HIGH (ref 11.5–15.5)
WBC Count: 7.8 10*3/uL (ref 4.0–10.5)
nRBC: 0 % (ref 0.0–0.2)

## 2020-10-21 LAB — CMP (CANCER CENTER ONLY)
ALT: 15 U/L (ref 0–44)
AST: 14 U/L — ABNORMAL LOW (ref 15–41)
Albumin: 3.3 g/dL — ABNORMAL LOW (ref 3.5–5.0)
Alkaline Phosphatase: 92 U/L (ref 38–126)
Anion gap: 11 (ref 5–15)
BUN: 17 mg/dL (ref 8–23)
CO2: 23 mmol/L (ref 22–32)
Calcium: 9 mg/dL (ref 8.9–10.3)
Chloride: 103 mmol/L (ref 98–111)
Creatinine: 1.35 mg/dL — ABNORMAL HIGH (ref 0.61–1.24)
GFR, Estimated: 57 mL/min — ABNORMAL LOW (ref >60)
Glucose, Bld: 115 mg/dL — ABNORMAL HIGH (ref 70–99)
Potassium: 4.1 mmol/L (ref 3.5–5.1)
Sodium: 137 mmol/L (ref 135–145)
Total Bilirubin: 0.7 mg/dL (ref 0.3–1.2)
Total Protein: 7.2 g/dL (ref 6.5–8.1)

## 2020-10-21 LAB — TSH: TSH: 1.948 u[IU]/mL (ref 0.320–4.118)

## 2020-10-21 MED ORDER — SODIUM CHLORIDE 0.9 % IV SOLN
1500.0000 mg | Freq: Once | INTRAVENOUS | Status: AC
  Administered 2020-10-21: 1500 mg via INTRAVENOUS
  Filled 2020-10-21: qty 30

## 2020-10-21 MED ORDER — SODIUM CHLORIDE 0.9 % IV SOLN: Freq: Once | INTRAVENOUS | Status: AC

## 2020-10-21 NOTE — Patient Instructions (Signed)
[facility]  Discharge Instructions: Thank you for choosing [facility] to provide your oncology and hematology care.   If you have a lab appointment with the Cancer Center, please go directly to the Cancer Center and check in at the registration area.   Wear comfortable clothing and clothing appropriate for easy access to any Portacath or PICC line.   We strive to give you quality time with your provider. You may need to reschedule your appointment if you arrive late (15 or more minutes).  Arriving late affects you and other patients whose appointments are after yours.  Also, if you miss three or more appointments without notifying the office, you may be dismissed from the clinic at the provider's discretion.      For prescription refill requests, have your pharmacy contact our office and allow 72 hours for refills to be completed.    Today you received the following chemotherapy and/or immunotherapy agents Imfinzi      To help prevent nausea and vomiting after your treatment, we encourage you to take your nausea medication as directed.  BELOW ARE SYMPTOMS THAT SHOULD BE REPORTED IMMEDIATELY: *FEVER GREATER THAN 100.4 F (38 C) OR HIGHER *CHILLS OR SWEATING *NAUSEA AND VOMITING THAT IS NOT CONTROLLED WITH YOUR NAUSEA MEDICATION *UNUSUAL SHORTNESS OF BREATH *UNUSUAL BRUISING OR BLEEDING *URINARY PROBLEMS (pain or burning when urinating, or frequent urination) *BOWEL PROBLEMS (unusual diarrhea, constipation, pain near the anus) TENDERNESS IN MOUTH AND THROAT WITH OR WITHOUT PRESENCE OF ULCERS (sore throat, sores in mouth, or a toothache) UNUSUAL RASH, SWELLING OR PAIN  UNUSUAL VAGINAL DISCHARGE OR ITCHING   Items with * indicate a potential emergency and should be followed up as soon as possible or go to the Emergency Department if any problems should occur.  Please show the CHEMOTHERAPY ALERT CARD or IMMUNOTHERAPY ALERT CARD at check-in to the  Emergency Department and triage nurse.  Should you have questions after your visit or need to cancel or reschedule your appointment, please contact [facility]  Dept: [phone]  and follow the prompts.  Office hours are 8:00 a.m. to 4:30 p.m. Monday - Friday. Please note that voicemails left after 4:00 p.m. may not be returned until the following business day.  We are closed weekends and major holidays. You have access to a nurse at all times for urgent questions. Please call the main number to the clinic Dept: [phone] and follow the prompts.   For any non-urgent questions, you may also contact your provider using MyChart. We now offer e-Visits for anyone 18 and older to request care online for non-urgent symptoms. For details visit [email or web address].   Also download the MyChart app! Go to the app store, search "MyChart", open the app, select [facility], and log in with your MyChart username and password.  Due to Covid, a mask is required upon entering the hospital/clinic. If you do not have a mask, one will be given to you upon arrival. For doctor visits, patients may have 1 support person aged 18 or older with them. For treatment visits, patients cannot have anyone with them due to current Covid guidelines and our immunocompromised population.   

## 2020-10-21 NOTE — Progress Notes (Signed)
[facility] [phone]   [phone]  OFFICE PROGRESS NOTE  [name], MD [address] 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  Diagnosed in 2019. 2) stage Ia3 (T1cN0M0) non-small cell lung cancer.  Diagnosed with a left suprahilar region with AP window adenopathy.  Status post SBRT 3) renal cell carcinoma status post CT-guided ablation in July 2020.  Followed by Dr. [name]. 4) recurrent non-small cell lung cancer in July 2021.  Patient presented with new adenopathy in the AP window. Inaccessible to biopsy   PRIOR THERAPY:  1) SBRT to the LUL nodule in 2019 under the care of Dr. Lisbeth Renshaw 2) SBRT to the left suprahilar mass under the care of Dr. Lisbeth Renshaw in March 2020 3) CT guided ablation to the renal cell carcinoma under the care of Dr. Kathlene Cote. 40 Concurrent chemoradiation with carboplatin for an AUC of 2 and paclitaxel 45 mg/m.  First dose on 09/30/2019.  Status post 6 cycles.  Last cycle was given on 11/12/2019 with partial response.   CURRENT THERAPY:  Consolidation treatment with immunotherapy with Imfinzi 1500 mg IV every 4 weeks.  First dose December 18, 2019.  Status post 11 cycles.   INTERVAL HISTORY: Reginald Berry 70 y.o. male returns to the clinic today for follow-up visit accompanied by his sister.  The patient is feeling fine today with no concerning complaints.  He denied having any chest pain but has shortness of breath at baseline increased with exertion.  He has mild cough with no hemoptysis.  He has no fever or chills.  He has no nausea, vomiting, diarrhea or constipation.  He has no headache or visual changes.  He continues to tolerate his treatment with Imfinzi fairly well.  The patient is here today for evaluation before starting cycle #12.   MEDICAL HISTORY: Past Medical History:  Diagnosis Date   Aortic atherosclerosis (HCC)     Arthritis    Chronic low back pain    COPD (chronic obstructive pulmonary disease) (HCC)    Coronary artery calcification seen on CT scan    Multivessel   Essential hypertension    Gait instability    Previous logging accident   GERD (gastroesophageal reflux disease)    Lung cancer ([place])    Poorly differentiated, XRT   Renal cell carcinoma ([place])    Left, status post cryoablation August 2020    ALLERGIES:  has No Known Allergies.  MEDICATIONS:  Current Outpatient Medications  Medication Sig Dispense Refill   ALPRAZolam (XANAX) 0.5 MG tablet Take 0.5 mg by mouth daily as needed.     amLODipine (NORVASC) 10 MG tablet Take 10 mg by mouth daily.     aspirin 81 MG EC tablet Take 81 mg by mouth daily. Swallow whole.     buPROPion (WELLBUTRIN XL) 300 MG 24 hr tablet Take 300 mg by mouth daily.     busPIRone (BUSPAR) 15 MG tablet Take 15 mg by mouth daily.     cetirizine (ZYRTEC) 10 MG tablet Take 10 mg by mouth daily as needed for allergies.     furosemide (LASIX) 40 MG tablet Take 40 mg by mouth daily.      hydrochlorothiazide (MICROZIDE) 12.5 MG capsule Take 12.5 mg by mouth daily.     liothyronine (CYTOMEL) 25 MCG tablet Take 25 mcg by mouth daily.     losartan (  COZAAR) 100 MG tablet Take 100 mg by mouth daily.     Multiple Vitamins-Minerals (CENTRUM SILVER 50+MEN) TABS Take 1 tablet by mouth daily.     oxyCODONE-acetaminophen (PERCOCET) 10-325 MG tablet Take 1 tablet by mouth every 4 (four) hours as needed for pain.     pantoprazole (PROTONIX) 40 MG tablet Take 40 mg by mouth every evening.      Probiotic Product (PROBIOTIC DAILY PO) Take 1 capsule by mouth daily.     prochlorperazine (COMPAZINE) 10 MG tablet TAKE ONE TABLET EVERY 6 HOURS AS NEEDED 30 tablet 2   rosuvastatin (CRESTOR) 5 MG tablet Take 5 mg by mouth at bedtime.     sildenafil (VIAGRA) 100 MG tablet Take 100 mg by mouth daily as needed for erectile dysfunction.     No current facility-administered medications for  this visit.    SURGICAL HISTORY:  Past Surgical History:  Procedure Laterality Date   CHOLECYSTECTOMY     2019   FRACTURE SURGERY     bil legs (logging trees)   IR RADIOLOGIST EVAL & MGMT  09/27/2018   IR RADIOLOGIST EVAL & MGMT  11/22/2018   IR RADIOLOGIST EVAL & MGMT  04/10/2019   IR RADIOLOGIST EVAL & MGMT  10/13/2020   NECK SURGERY  1994-1995   RADIOLOGY WITH ANESTHESIA Left 10/31/2018   Procedure: CT WITH ANESTHESIA RENAL CRYO ABLATION;  Surgeon: Aletta Edouard, MD;  Location: [facility];  Service: Radiology;  Laterality: Left;    REVIEW OF SYSTEMS:  A comprehensive review of systems was negative except for: Constitutional: positive for fatigue Respiratory: positive for cough and dyspnea on exertion   PHYSICAL EXAMINATION: General appearance: alert, cooperative, fatigued, and no distress Head: Normocephalic, without obvious abnormality, atraumatic Neck: no adenopathy, no JVD, supple, symmetrical, trachea midline, and thyroid not enlarged, symmetric, no tenderness/mass/nodules Lymph nodes: Cervical, supraclavicular, and axillary nodes normal. Resp: clear to auscultation bilaterally Back: symmetric, no curvature. ROM normal. No CVA tenderness. Cardio: regular rate and rhythm, S1, S2 normal, no murmur, click, rub or gallop GI: soft, non-tender; bowel sounds normal; no masses,  no organomegaly Extremities: extremities normal, atraumatic, no cyanosis or edema  ECOG PERFORMANCE STATUS: 1 - Symptomatic but completely ambulatory  Blood pressure 119/64, pulse 82, temperature (!) 97.4 F (36.3 C), temperature source Tympanic, resp. rate 20, height 6\' 2"  (1.88 m), SpO2 100 %.  LABORATORY DATA: Lab Results  Component Value Date   WBC 7.8 10/21/2020   HGB 13.8 10/21/2020   HCT 41.9 10/21/2020   MCV 81.2 10/21/2020   PLT 265 10/21/2020      Chemistry      Component Value Date/Time   NA 137 10/21/2020 0805   K 4.1 10/21/2020 0805   CL 103 10/21/2020 0805   CO2 23 10/21/2020 0805    BUN 17 10/21/2020 0805   CREATININE 1.35 (H) 10/21/2020 0805      Component Value Date/Time   CALCIUM 9.0 10/21/2020 0805   ALKPHOS 92 10/21/2020 0805   AST 14 (L) 10/21/2020 0805   ALT 15 10/21/2020 0805   BILITOT 0.7 10/21/2020 0805       RADIOGRAPHIC STUDIES: CT ABDOMEN W WO CONTRAST  Result Date: 10/09/2020 CLINICAL DATA:  History of lung cancer, post renal cryoablation on the LEFT in 2020. EXAM: CT ABDOMEN WITHOUT AND WITH CONTRAST TECHNIQUE: Multidetector CT imaging of the abdomen was performed following the standard protocol before and following the bolus administration of intravenous contrast. CONTRAST:  47mL OMNIPAQUE IOHEXOL 350 MG/ML SOLN COMPARISON:  April 04, 2019. FINDINGS: Lower chest: Incidental imaging of the lung bases without effusion or sign of consolidative change. Hepatobiliary: Reflux into hepatic veins during arterial phase injection. Lobular hepatic contours, mildly lobular with mild fissural widening. Post cholecystectomy without biliary duct dilation. No focal, suspicious hepatic lesion. Pancreas: Normal, without mass, inflammation or ductal dilatation. Spleen: Normal spleen. Adrenals/Urinary Tract: Adrenal glands with thickening of the bilateral adrenal glands greatest on the LEFT in the LEFT adrenal body the showing attenuation characteristics less than 10 Hounsfield units compatible with benign process. This area measures 15 mm greatest thickness. Post LEFT renal cryoablation arising from posterior interpolar LEFT kidney, no signs of nodular peripheral enhancement to suggest disease recurrence with expected rim of post treatment change. RIGHT renal cysts showing similar appearance. Including a posterior hilar lip hypodense lesion measuring approximately 1.8 cm that is nearly isodense on pre contrast images, 26 Hounsfield units on arterial phase and 30 Hounsfield units on postcontrast nephrographic phase images. No hydronephrosis. No perinephric stranding. Stomach/Bowel:  Visualized gastrointestinal tract without acute process. Vascular/Lymphatic: 3 cm infrarenal abdominal aorta, normal caliber above this area 2.3 cm less than 1.5 cm dimension of the normal segment. Mild ectasia without need for dedicated follow-up at this time. There is no gastrohepatic or hepatoduodenal ligament lymphadenopathy. No retroperitoneal or mesenteric lymphadenopathy. Smooth contour of the IVC. Other: No ascites.  No free air. Musculoskeletal: No acute bone finding or destructive bone process. IMPRESSION: 1. Post LEFT renal cryoablation with expected rim of post treatment change. No evidence of disease recurrence. 2. Cysts some with hemorrhagic or proteinaceous features in the RIGHT kidney, no sign of suspicious enhancing lesion. 3. Reflux into hepatic veins during arterial phase, may relate to Valsalva or contrast rate injection can sometimes be seen in the setting of cardiac dysfunction however. Correlate with any history of cardiac dysfunction. Visualized heart is normal size. 4. Lobular hepatic contours with mild fissural widening, correlate with any clinical or laboratory evidence of liver disease. 5. Aortic atherosclerosis. Aortic Atherosclerosis (ICD10-I70.0). Electronically Signed   By: Zetta Bills M.D.   On: 10/09/2020 09:43   IR Radiologist Eval & Mgmt  Result Date: 10/13/2020 Please refer to notes tab for details about interventional procedure. (Op Note)   ASSESSMENT AND PLAN: This is a very pleasant 70 years old white male with recurrent non-small cell lung cancer presenting as a stage IIIa with mediastinal lymphadenopathy in July 2021.  The patient has a history of early stage non-small cell lung cancer, adenosquamous carcinoma as stage Ib of the left upper lobe status post SBRT in 2019.  He also has a stage Ia3 non-small cell lung cancer status post SBRT to the left suprahilar region and AP window adenopathy. He completed a course of concurrent chemoradiation with weekly carboplatin  and paclitaxel status post 6 cycles and tolerated his treatment well. The patient is currently undergoing treatment with immunotherapy with Imfinzi 1500 mg IV every 4 weeks status post 11 cycles.   The patient continues to tolerate this treatment well with no concerning adverse effects. I recommended for him to proceed with cycle #12 today as planned. He had repeat CT of the abdomen and pelvis for evaluation of the renal lesion and there was no evidence for disease recurrence status post cryoablation. I will see the patient back for follow-up visit in 4 weeks for evaluation before the next cycle of his treatment.  He will have repeat CT scan of the chest after the last cycle of his treatment. He was advised  to call immediately if he has any other concerning symptoms in the interval. The patient voices understanding of current disease status and treatment options and is in agreement with the current care plan. All questions were answered. The patient knows to call the clinic with any problems, questions or concerns. We can certainly see the patient much sooner if necessary.   Disclaimer: This note was dictated with voice recognition software. Similar sounding words can inadvertently be transcribed and may not be corrected upon review.

## 2020-10-21 NOTE — Progress Notes (Signed)
[facility] Work  CSW provided [employer] to patient in the infusion room.  Encouraged patient to call with needs or concerns.  Johnnye Lana, MSW, LCSW, OSW-C Clinical Social Worker [facility] [phone]

## 2020-10-21 NOTE — Progress Notes (Signed)
[facility] CSW Progress Notes  Met w patient in infusion, provided second disbursement from [employer] for food and/or gas.  Edwyna Shell, LCSW Clinical Social Worker Phone:  [phone]

## 2020-10-27 NOTE — Progress Notes (Signed)
Chief Complaint: Patient was consulted remotely today (TeleHealth) for for follow up after cryoablation of a left renal carcinoma.  History of Present Illness: Reginald Berry is a 70 y.o. male status post cryoablation of a 2.6 cm posterior interpolar biopsy-proven left renal clear cell carcinoma on 10/31/2018. He has no pain or urinary symptoms. He is being treated with immunotherapy by Dr. Julien Nordmann for his left lung adenosquamous carcinoma after previously completing radiation therapy and chemotherapy.  Past Medical History:  Diagnosis Date   Aortic atherosclerosis (HCC)    Arthritis    Chronic low back pain    COPD (chronic obstructive pulmonary disease) (HCC)    Coronary artery calcification seen on CT scan    Multivessel   Essential hypertension    Gait instability    Previous logging accident   GERD (gastroesophageal reflux disease)    Lung cancer ([place])    Poorly differentiated, XRT   Renal cell carcinoma ([place])    Left, status post cryoablation August 2020    Past Surgical History:  Procedure Laterality Date   CHOLECYSTECTOMY     2019   FRACTURE SURGERY     bil legs (logging trees)   IR RADIOLOGIST EVAL & MGMT  09/27/2018   IR RADIOLOGIST EVAL & MGMT  11/22/2018   IR RADIOLOGIST EVAL & MGMT  04/10/2019   IR RADIOLOGIST EVAL & MGMT  10/13/2020   NECK SURGERY  1994-1995   RADIOLOGY WITH ANESTHESIA Left 10/31/2018   Procedure: CT WITH ANESTHESIA RENAL CRYO ABLATION;  Surgeon: Aletta Edouard, MD;  Location: [facility];  Service: Radiology;  Laterality: Left;    Allergies: Patient has no known allergies.  Medications: Prior to Admission medications   Medication Sig Start Date End Date Taking? Authorizing Provider  ALPRAZolam Duanne Moron) 0.5 MG tablet Take 0.5 mg by mouth daily as needed. 05/14/20   [provider]  amLODipine (NORVASC) 10 MG tablet Take 10 mg by mouth daily.    [provider]  aspirin 81 MG EC tablet Take 81 mg by mouth daily. Swallow  whole.    [provider]  buPROPion (WELLBUTRIN XL) 300 MG 24 hr tablet Take 300 mg by mouth daily. 07/26/18   [provider]  busPIRone (BUSPAR) 15 MG tablet Take 15 mg by mouth daily. 07/26/18   [provider]  cetirizine (ZYRTEC) 10 MG tablet Take 10 mg by mouth daily as needed for allergies.    [provider]  furosemide (LASIX) 40 MG tablet Take 40 mg by mouth daily.     [provider]  hydrochlorothiazide (MICROZIDE) 12.5 MG capsule Take 12.5 mg by mouth daily.    [provider]  liothyronine (CYTOMEL) 25 MCG tablet Take 25 mcg by mouth daily. 07/26/18   [provider]  losartan (COZAAR) 100 MG tablet Take 100 mg by mouth daily. 07/26/18   [provider]  Multiple Vitamins-Minerals (CENTRUM SILVER 50+MEN) TABS Take 1 tablet by mouth daily.    [provider]  oxyCODONE-acetaminophen (PERCOCET) 10-325 MG tablet Take 1 tablet by mouth every 4 (four) hours as needed for pain.    [provider]  pantoprazole (PROTONIX) 40 MG tablet Take 40 mg by mouth every evening.     [provider]  Probiotic Product (PROBIOTIC DAILY PO) Take 1 capsule by mouth daily.    [provider]  prochlorperazine (COMPAZINE) 10 MG tablet TAKE ONE TABLET EVERY 6 HOURS AS NEEDED 08/26/20   Heilingoetter, Cassandra L, PA-C  rosuvastatin (CRESTOR) 5 MG tablet Take 5 mg by mouth at bedtime.    [provider]  sildenafil (VIAGRA) 100 MG tablet Take 100 mg by mouth daily as needed for erectile dysfunction.    [provider]     Family History  Problem Relation Age of Onset   Heart attack Mother    Renal Disease Mother    Heart failure Father    Heart attack Father    Diabetes Sister    Heart disease Brother     Social History   Socioeconomic History   Marital status: Single    Spouse name: Not on file   Number of children: Not on file   Years of education: Not on file   Highest  education level: Not on file  Occupational History   Not on file  Tobacco Use   Smoking status: Every Day    Packs/day: 0.50    Types: Cigarettes   Smokeless tobacco: Never   Tobacco comments:    10 a day  Vaping Use   Vaping Use: Never used  Substance and Sexual Activity   Alcohol use: Never    Comment: BEER ONCE IN A WHILE    Drug use: Never   Sexual activity: Not Currently  Other Topics Concern   Not on file  Social History Narrative   10-23-17 Unable to ask abuse questions wife and other family with him today.   Social Determinants of Health   Financial Resource Strain: Not on file  Food Insecurity: Not on file  Transportation Needs: Not on file  Physical Activity: Not on file  Stress: Not on file  Social Connections: Not on file    ECOG Status: 0 - Asymptomatic  Review of Systems  Constitutional: Negative.   Respiratory: Negative.    Cardiovascular: Negative.   Genitourinary: Negative.   Musculoskeletal: Negative.   Neurological: Negative.    Review of Systems: A 12 point ROS discussed and pertinent positives are indicated in the HPI above.  All other systems are negative.  Physical Exam No direct physical exam was performed (except for noted visual exam findings with Video Visits).   Vital Signs: There were no vitals taken for this visit.  Imaging: CT ABDOMEN W WO CONTRAST  Result Date: 10/09/2020 CLINICAL DATA:  History of lung cancer, post renal cryoablation on the LEFT in 2020. EXAM: CT ABDOMEN WITHOUT AND WITH CONTRAST TECHNIQUE: Multidetector CT imaging of the abdomen was performed following the standard protocol before and following the bolus administration of intravenous contrast. CONTRAST:  34mL OMNIPAQUE IOHEXOL 350 MG/ML SOLN COMPARISON:  April 04, 2019. FINDINGS: Lower chest: Incidental imaging of the lung bases without effusion or sign of consolidative change. Hepatobiliary: Reflux into hepatic veins during arterial phase injection. Lobular  hepatic contours, mildly lobular with mild fissural widening. Post cholecystectomy without biliary duct dilation. No focal, suspicious hepatic lesion. Pancreas: Normal, without mass, inflammation or ductal dilatation. Spleen: Normal spleen. Adrenals/Urinary Tract: Adrenal glands with thickening of the bilateral adrenal glands greatest on the LEFT in the LEFT adrenal body the showing attenuation characteristics less than 10 Hounsfield units compatible with benign process. This area measures 15 mm greatest thickness. Post LEFT renal cryoablation arising from posterior interpolar LEFT kidney, no signs of nodular peripheral enhancement to suggest disease recurrence with expected rim of post treatment change. RIGHT renal cysts showing similar appearance. Including a posterior hilar lip hypodense lesion measuring approximately 1.8 cm that is nearly isodense on pre contrast images, 26 Hounsfield units  on arterial phase and 30 Hounsfield units on postcontrast nephrographic phase images. No hydronephrosis. No perinephric stranding. Stomach/Bowel: Visualized gastrointestinal tract without acute process. Vascular/Lymphatic: 3 cm infrarenal abdominal aorta, normal caliber above this area 2.3 cm less than 1.5 cm dimension of the normal segment. Mild ectasia without need for dedicated follow-up at this time. There is no gastrohepatic or hepatoduodenal ligament lymphadenopathy. No retroperitoneal or mesenteric lymphadenopathy. Smooth contour of the IVC. Other: No ascites.  No free air. Musculoskeletal: No acute bone finding or destructive bone process. IMPRESSION: 1. Post LEFT renal cryoablation with expected rim of post treatment change. No evidence of disease recurrence. 2. Cysts some with hemorrhagic or proteinaceous features in the RIGHT kidney, no sign of suspicious enhancing lesion. 3. Reflux into hepatic veins during arterial phase, may relate to Valsalva or contrast rate injection can sometimes be seen in the setting of  cardiac dysfunction however. Correlate with any history of cardiac dysfunction. Visualized heart is normal size. 4. Lobular hepatic contours with mild fissural widening, correlate with any clinical or laboratory evidence of liver disease. 5. Aortic atherosclerosis. Aortic Atherosclerosis (ICD10-I70.0). Electronically Signed   By: Zetta Bills M.D.   On: 10/09/2020 09:43   IR Radiologist Eval & Mgmt  Result Date: 10/13/2020 Please refer to notes tab for details about interventional procedure. (Op Note)   Labs:  CBC: Recent Labs    07/29/20 0739 08/26/20 0804 09/23/20 0837 10/21/20 0805  WBC 6.9 7.0 5.5 7.8  HGB 13.5 13.4 13.4 13.8  HCT 42.2 41.3 40.8 41.9  PLT 223 274 220 265    COAGS: No results for input(s): INR, APTT in the last 8760 hours.  BMP: Recent Labs    11/04/19 1203 11/12/19 0812 12/10/19 1023 07/29/20 0739 08/26/20 0804 09/23/20 0837 10/21/20 0805  NA 135 135   < > 138 136 134* 137  K 4.2 4.2   < > 4.2 4.2 3.9 4.1  CL 106 105   < > 106 104 105 103  CO2 23 22   < > 21* 22 23 23   GLUCOSE 128* 121*   < > 110* 126* 116* 115*  BUN 25* 20   < > 14 21 15 17   CALCIUM 9.5 8.9   < > 8.9 9.0 8.9 9.0  CREATININE 1.12 1.12   < > 1.12 1.19 1.18 1.35*  GFRNONAA >60 >60   < > >60 >60 >60 57*  GFRAA >60 >60  --   --   --   --   --    < > = values in this interval not displayed.    LIVER FUNCTION TESTS: Recent Labs    07/29/20 0739 08/26/20 0804 09/23/20 0837 10/21/20 0805  BILITOT 0.5 0.6 0.6 0.7  AST 14* 16 16 14*  ALT 10 13 13 15   ALKPHOS 95 90 96 92  PROT 6.9 7.0 7.1 7.2  ALBUMIN 3.1* 3.1* 3.0* 3.3*    TUMOR MARKERS: No results for input(s): AFPTM, CEA, CA199, CHROMGRNA in the last 8760 hours.  Assessment and Plan:  I spoke with Mr. Reginald Berry over the phone.  A follow-up CT on 10/08/2020, 2 years after ablation, demonstrates decrease in size of post ablation scar tissue since the prior study dated 04/04/2019 and no evidence of enhancement to suggest  recurrent carcinoma.  No new concerning renal lesions are identified with stable cysts of the right kidney.  Renal function in July was stable. I recommended a follow-up CT in 1 year.   Electronically Signed: [place]ia Night  Kathlene Cote 10/27/2020, 9:38 AM    I spent a total of  10 Minutes in remote  clinical consultation, greater than 50% of which was counseling/coordinating care post cryoablation of a left renal carcinoma.    Visit type: Audio only (telephone). Audio (no video) only due to patient's lack of internet/smartphone capability. Alternative for in-person consultation at [facility], [address] [place], [place], [place]. This visit type was conducted due to national recommendations for restrictions regarding the COVID-19 Pandemic (e.g. social distancing).  This format is felt to be most appropriate for this patient at this time.  All issues noted in this document were discussed and addressed.

## 2020-11-18 ENCOUNTER — Inpatient Hospital Stay: Payer: Medicare Other

## 2020-11-18 ENCOUNTER — Other Ambulatory Visit: Payer: Self-pay

## 2020-11-18 ENCOUNTER — Inpatient Hospital Stay: Payer: Medicare Other | Attending: Internal Medicine | Admitting: Internal Medicine

## 2020-11-18 ENCOUNTER — Encounter: Payer: Self-pay | Admitting: Internal Medicine

## 2020-11-18 VITALS — BP 123/60 | HR 86 | Temp 97.4°F | Resp 19 | Ht 74.0 in

## 2020-11-18 DIAGNOSIS — Z79899 Other long term (current) drug therapy: Secondary | ICD-10-CM | POA: Insufficient documentation

## 2020-11-18 DIAGNOSIS — C3490 Malignant neoplasm of unspecified part of unspecified bronchus or lung: Secondary | ICD-10-CM

## 2020-11-18 DIAGNOSIS — C3412 Malignant neoplasm of upper lobe, left bronchus or lung: Secondary | ICD-10-CM | POA: Diagnosis not present

## 2020-11-18 DIAGNOSIS — C3492 Malignant neoplasm of unspecified part of left bronchus or lung: Secondary | ICD-10-CM

## 2020-11-18 DIAGNOSIS — Z5112 Encounter for antineoplastic immunotherapy: Secondary | ICD-10-CM | POA: Diagnosis not present

## 2020-11-18 LAB — CBC WITH DIFFERENTIAL (CANCER CENTER ONLY)
Abs Immature Granulocytes: 0.03 10*3/uL (ref 0.00–0.07)
Basophils Absolute: 0.1 10*3/uL (ref 0.0–0.1)
Basophils Relative: 2 %
Eosinophils Absolute: 0.7 10*3/uL — ABNORMAL HIGH (ref 0.0–0.5)
Eosinophils Relative: 9 %
HCT: 39.7 % (ref 39.0–52.0)
Hemoglobin: 13 g/dL (ref 13.0–17.0)
Immature Granulocytes: 0 %
Lymphocytes Relative: 18 %
Lymphs Abs: 1.3 10*3/uL (ref 0.7–4.0)
MCH: 27.3 pg (ref 26.0–34.0)
MCHC: 32.7 g/dL (ref 30.0–36.0)
MCV: 83.2 fL (ref 80.0–100.0)
Monocytes Absolute: 0.6 10*3/uL (ref 0.1–1.0)
Monocytes Relative: 7 %
Neutro Abs: 4.9 10*3/uL (ref 1.7–7.7)
Neutrophils Relative %: 64 %
Platelet Count: 314 10*3/uL (ref 150–400)
RBC: 4.77 MIL/uL (ref 4.22–5.81)
RDW: 18.6 % — ABNORMAL HIGH (ref 11.5–15.5)
WBC Count: 7.6 10*3/uL (ref 4.0–10.5)
nRBC: 0 % (ref 0.0–0.2)

## 2020-11-18 LAB — CMP (CANCER CENTER ONLY)
ALT: 11 U/L (ref 0–44)
AST: 12 U/L — ABNORMAL LOW (ref 15–41)
Albumin: 3.2 g/dL — ABNORMAL LOW (ref 3.5–5.0)
Alkaline Phosphatase: 94 U/L (ref 38–126)
Anion gap: 10 (ref 5–15)
BUN: 15 mg/dL (ref 8–23)
CO2: 23 mmol/L (ref 22–32)
Calcium: 9.4 mg/dL (ref 8.9–10.3)
Chloride: 102 mmol/L (ref 98–111)
Creatinine: 1.07 mg/dL (ref 0.61–1.24)
GFR, Estimated: >60 mL/min
Glucose, Bld: 123 mg/dL — ABNORMAL HIGH (ref 70–99)
Potassium: 4.2 mmol/L (ref 3.5–5.1)
Sodium: 135 mmol/L (ref 135–145)
Total Bilirubin: 0.7 mg/dL (ref 0.3–1.2)
Total Protein: 7.7 g/dL (ref 6.5–8.1)

## 2020-11-18 LAB — TSH: TSH: 1.478 u[IU]/mL (ref 0.320–4.118)

## 2020-11-18 MED ORDER — SODIUM CHLORIDE 0.9 % IV SOLN
1500.0000 mg | Freq: Once | INTRAVENOUS | Status: AC
  Administered 2020-11-18: 1500 mg via INTRAVENOUS
  Filled 2020-11-18: qty 30

## 2020-11-18 MED ORDER — SODIUM CHLORIDE 0.9 % IV SOLN: Freq: Once | INTRAVENOUS | Status: AC

## 2020-11-18 NOTE — Patient Instructions (Signed)
Steps to Quit Smoking Smoking tobacco is the leading cause of preventable death. It can affect almost every organ in the body. Smoking puts you and people around you at risk for many serious, long-lasting (chronic) diseases. Quitting smoking can be hard, but it is one of the best things that you can do for your health. It is never too late to quit. How do I get ready to quit? When you decide to quit smoking, make a plan to help you succeed. Before you quit: Pick a date to quit. Set a date within the next 2 weeks to give you time to prepare. Write down the reasons why you are quitting. Keep this list in places where you will see it often. Tell your family, friends, and co-workers that you are quitting. Their support is important. Talk with your doctor about the choices that may help you quit. Find out if your health insurance will pay for these treatments. Know the people, places, things, and activities that make you want to smoke (triggers). Avoid them. What first steps can I take to quit smoking? Throw away all cigarettes at home, at work, and in your car. Throw away the things that you use when you smoke, such as ashtrays and lighters. Clean your car. Make sure to empty the ashtray. Clean your home, including curtains and carpets. What can I do to help me quit smoking? Talk with your doctor about taking medicines and seeing a counselor at the same time. You are more likely to succeed when you do both. If you are pregnant or breastfeeding, talk with your doctor about counseling or other ways to quit smoking. Do not take medicine to help you quit smoking unless your doctor tells you to do so. To quit smoking: Quit right away Quit smoking totally, instead of slowly cutting back on how much you smoke over a period of time. Go to counseling. You are more likely to quit if you go to counseling sessions regularly. Take medicine You may take medicines to help you quit. Some medicines need a  prescription, and some you can buy over-the-counter. Some medicines may contain a drug called nicotine to replace the nicotine in cigarettes. Medicines may: Help you to stop having the desire to smoke (cravings). Help to stop the problems that come when you stop smoking (withdrawal symptoms). Your doctor may ask you to use: Nicotine patches, gum, or lozenges. Nicotine inhalers or sprays. Non-nicotine medicine that is taken by mouth. Find resources Find resources and other ways to help you quit smoking and remain smoke-free after you quit. These resources are most helpful when you use them often. They include: Online chats with a counselor. Phone quitlines. Printed self-help materials. Support groups or group counseling. Text messaging programs. Mobile phone apps. Use apps on your mobile phone or tablet that can help you stick to your quit plan. There are many free apps for mobile phones and tablets as well as websites. Examples include Quit Guide from the CDC and smokefree.gov  What things can I do to make it easier to quit?  Talk to your family and friends. Ask them to support and encourage you. Call a phone quitline (1-800-QUIT-NOW), reach out to support groups, or work with a counselor. Ask people who smoke to not smoke around you. Avoid places that make you want to smoke, such as: Bars. Parties. Smoke-break areas at work. Spend time with people who do not smoke. Lower the stress in your life. Stress can make you want to   smoke. Try these things to help your stress: Getting regular exercise. Doing deep-breathing exercises. Doing yoga. Meditating. Doing a body scan. To do this, close your eyes, focus on one area of your body at a time from head to toe. Notice which parts of your body are tense. Try to relax the muscles in those areas. How will I feel when I quit smoking? Day 1 to 3 weeks Within the first 24 hours, you may start to have some problems that come from quitting tobacco.  These problems are very bad 2-3 days after you quit, but they do not often last for more than 2-3 weeks. You may get these symptoms: Mood swings. Feeling restless, nervous, angry, or annoyed. Trouble concentrating. Dizziness. Strong desire for high-sugar foods and nicotine. Weight gain. Trouble pooping (constipation). Feeling like you may vomit (nausea). Coughing or a sore throat. Changes in how the medicines that you take for other issues work in your body. Depression. Trouble sleeping (insomnia). Week 3 and afterward After the first 2-3 weeks of quitting, you may start to notice more positive results, such as: Better sense of smell and taste. Less coughing and sore throat. Slower heart rate. Lower blood pressure. Clearer skin. Better breathing. Fewer sick days. Quitting smoking can be hard. Do not give up if you fail the first time. Some people need to try a few times before they succeed. Do your best to stick to your quit plan, and talk with your doctor if you have any questions or concerns. Summary Smoking tobacco is the leading cause of preventable death. Quitting smoking can be hard, but it is one of the best things that you can do for your health. When you decide to quit smoking, make a plan to help you succeed. Quit smoking right away, not slowly over a period of time. When you start quitting, seek help from your doctor, family, or friends. This information is not intended to replace advice given to you by your health care provider. Make sure you discuss any questions you have with your health care provider. Document Revised: 11/16/2018 Document Reviewed: 05/12/2018 Elsevier Patient Education  2022 Elsevier Inc.  

## 2020-11-18 NOTE — Progress Notes (Signed)
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  Diagnosed in 2019. 2) stage Ia3 (T1cN0M0) non-small cell lung cancer.  Diagnosed with a left suprahilar region with AP window adenopathy.  Status post SBRT 3) renal cell carcinoma status post CT-guided ablation in July 2020.  Followed by Dr. [name]. 4) recurrent non-small cell lung cancer in July 2021.  Patient presented with new adenopathy in the AP window. Inaccessible to biopsy   PRIOR THERAPY:  1) SBRT to the LUL nodule in 2019 under the care of Dr. Lisbeth Renshaw 2) SBRT to the left suprahilar mass under the care of Dr. Lisbeth Renshaw in March 2020 3) CT guided ablation to the renal cell carcinoma under the care of Dr. Kathlene Cote. 40 Concurrent chemoradiation with carboplatin for an AUC of 2 and paclitaxel 45 mg/m.  First dose on 09/30/2019.  Status post 6 cycles.  Last cycle was given on 11/12/2019 with partial response.   CURRENT THERAPY:  Consolidation treatment with immunotherapy with Imfinzi 1500 mg IV every 4 weeks.  First dose December 18, 2019.  Status post 12 cycles.   INTERVAL HISTORY: Reginald Berry 70 y.o. male returns to the clinic today for follow-up visit accompanied by his sister.  The patient is feeling fine today with no concerning complaints.  He he is recovering from a recent viral gastroenteritis with a lot of diarrhea but that significantly improved.  He denied having any current nausea, vomiting, diarrhea or constipation.  He has no chest pain but has shortness of breath with exertion with no cough or hemoptysis.  He denied having any headache or visual changes.  He has no significant weight loss or night sweats.  The patient is here today for evaluation before the last dose of his consolidation treatment with  Imfinzi.    MEDICAL HISTORY: Past Medical History:  Diagnosis Date   Aortic atherosclerosis (HCC)    Arthritis    Chronic low back pain    COPD (chronic obstructive pulmonary disease) (HCC)    Coronary artery calcification seen on CT scan    Multivessel   Essential hypertension    Gait instability    Previous logging accident   GERD (gastroesophageal reflux disease)    Lung cancer ([facility])    Poorly differentiated, XRT   Renal cell carcinoma ([place])    Left, status post cryoablation August 2020    ALLERGIES:  has No Known Allergies.  MEDICATIONS:  Current Outpatient Medications  Medication Sig Dispense Refill   ALPRAZolam (XANAX) 0.5 MG tablet Take 0.5 mg by mouth daily as needed.     amLODipine (NORVASC) 10 MG tablet Take 10 mg by mouth daily.     aspirin 81 MG EC tablet Take 81 mg by mouth daily. Swallow whole.     buPROPion (WELLBUTRIN XL) 300 MG 24 hr tablet Take 300 mg by mouth daily.     busPIRone (BUSPAR) 15 MG tablet Take 15 mg by mouth daily.     cetirizine (ZYRTEC) 10 MG tablet Take 10 mg by mouth daily as needed for allergies.     furosemide (LASIX) 40 MG tablet Take 40 mg by mouth daily.      hydrochlorothiazide (MICROZIDE) 12.5 MG capsule Take 12.5 mg by mouth daily.     liothyronine (CYTOMEL)  25 MCG tablet Take 25 mcg by mouth daily.     losartan (COZAAR) 100 MG tablet Take 100 mg by mouth daily.     Multiple Vitamins-Minerals (CENTRUM SILVER 50+MEN) TABS Take 1 tablet by mouth daily.     oxyCODONE-acetaminophen (PERCOCET) 10-325 MG tablet Take 1 tablet by mouth every 4 (four) hours as needed for pain.     pantoprazole (PROTONIX) 40 MG tablet Take 40 mg by mouth every evening.      Probiotic Product (PROBIOTIC DAILY PO) Take 1 capsule by mouth daily.     prochlorperazine (COMPAZINE) 10 MG tablet TAKE ONE TABLET EVERY 6 HOURS AS NEEDED 30 tablet 2   rosuvastatin (CRESTOR) 5 MG tablet Take 5 mg by mouth at bedtime.     sildenafil (VIAGRA) 100 MG tablet Take 100 mg  by mouth daily as needed for erectile dysfunction.     No current facility-administered medications for this visit.    SURGICAL HISTORY:  Past Surgical History:  Procedure Laterality Date   CHOLECYSTECTOMY     2019   FRACTURE SURGERY     bil legs (logging trees)   IR RADIOLOGIST EVAL & MGMT  09/27/2018   IR RADIOLOGIST EVAL & MGMT  11/22/2018   IR RADIOLOGIST EVAL & MGMT  04/10/2019   IR RADIOLOGIST EVAL & MGMT  10/13/2020   NECK SURGERY  1994-1995   RADIOLOGY WITH ANESTHESIA Left 10/31/2018   Procedure: CT WITH ANESTHESIA RENAL CRYO ABLATION;  Surgeon: Aletta Edouard, MD;  Location: [facility];  Service: Radiology;  Laterality: Left;    REVIEW OF SYSTEMS:  A comprehensive review of systems was negative except for: Constitutional: positive for fatigue Respiratory: positive for dyspnea on exertion   PHYSICAL EXAMINATION: General appearance: alert, cooperative, fatigued, and no distress Head: Normocephalic, without obvious abnormality, atraumatic Neck: no adenopathy, no JVD, supple, symmetrical, trachea midline, and thyroid not enlarged, symmetric, no tenderness/mass/nodules Lymph nodes: Cervical, supraclavicular, and axillary nodes normal. Resp: clear to auscultation bilaterally Back: symmetric, no curvature. ROM normal. No CVA tenderness. Cardio: regular rate and rhythm, S1, S2 normal, no murmur, click, rub or gallop GI: soft, non-tender; bowel sounds normal; no masses,  no organomegaly Extremities: extremities normal, atraumatic, no cyanosis or edema  ECOG PERFORMANCE STATUS: 1 - Symptomatic but completely ambulatory  Blood pressure 123/60, pulse 86, temperature (!) 97.4 F (36.3 C), temperature source Tympanic, resp. rate 19, height 6\' 2"  (1.88 m), SpO2 100 %.  LABORATORY DATA: Lab Results  Component Value Date   WBC 7.6 11/18/2020   HGB 13.0 11/18/2020   HCT 39.7 11/18/2020   MCV 83.2 11/18/2020   PLT 314 11/18/2020      Chemistry      Component Value Date/Time   NA 137  10/21/2020 0805   K 4.1 10/21/2020 0805   CL 103 10/21/2020 0805   CO2 23 10/21/2020 0805   BUN 17 10/21/2020 0805   CREATININE 1.35 (H) 10/21/2020 0805      Component Value Date/Time   CALCIUM 9.0 10/21/2020 0805   ALKPHOS 92 10/21/2020 0805   AST 14 (L) 10/21/2020 0805   ALT 15 10/21/2020 0805   BILITOT 0.7 10/21/2020 0805       RADIOGRAPHIC STUDIES: No results found.  ASSESSMENT AND PLAN: This is a very pleasant 70 years old white male with recurrent non-small cell lung cancer presenting as a stage IIIa with mediastinal lymphadenopathy in July 2021.  The patient has a history of early stage non-small cell lung cancer, adenosquamous carcinoma as  stage Ib of the left upper lobe status post SBRT in 2019.  He also has a stage Ia3 non-small cell lung cancer status post SBRT to the left suprahilar region and AP window adenopathy. He completed a course of concurrent chemoradiation with weekly carboplatin and paclitaxel status post 6 cycles and tolerated his treatment well. The patient is currently undergoing treatment with immunotherapy with Imfinzi 1500 mg IV every 4 weeks status post 12 cycles.   He continues to tolerate his treatment well with no concerning adverse effects. I recommended for him to complete the last dose of his consolidation immunotherapy with Imfinzi today as planned. I will see the patient back for follow-up visit in 1 months for evaluation with repeat CT scan of the chest for restaging of his disease. The patient was advised to call immediately if he has any other concerning symptoms in the interval. The patient voices understanding of current disease status and treatment options and is in agreement with the current care plan. All questions were answered. The patient knows to call the clinic with any problems, questions or concerns. We can certainly see the patient much sooner if necessary.   Disclaimer: This note was dictated with voice recognition software. Similar  sounding words can inadvertently be transcribed and may not be corrected upon review.

## 2020-11-18 NOTE — Patient Instructions (Signed)
[facility]  Discharge Instructions: Thank you for choosing [facility] to provide your oncology and hematology care.   If you have a lab appointment with the Cancer Center, please go directly to the Cancer Center and check in at the registration area.   Wear comfortable clothing and clothing appropriate for easy access to any Portacath or PICC line.   We strive to give you quality time with your provider. You may need to reschedule your appointment if you arrive late (15 or more minutes).  Arriving late affects you and other patients whose appointments are after yours.  Also, if you miss three or more appointments without notifying the office, you may be dismissed from the clinic at the provider's discretion.      For prescription refill requests, have your pharmacy contact our office and allow 72 hours for refills to be completed.    Today you received the following chemotherapy and/or immunotherapy agents Imfinzi      To help prevent nausea and vomiting after your treatment, we encourage you to take your nausea medication as directed.  BELOW ARE SYMPTOMS THAT SHOULD BE REPORTED IMMEDIATELY: *FEVER GREATER THAN 100.4 F (38 C) OR HIGHER *CHILLS OR SWEATING *NAUSEA AND VOMITING THAT IS NOT CONTROLLED WITH YOUR NAUSEA MEDICATION *UNUSUAL SHORTNESS OF BREATH *UNUSUAL BRUISING OR BLEEDING *URINARY PROBLEMS (pain or burning when urinating, or frequent urination) *BOWEL PROBLEMS (unusual diarrhea, constipation, pain near the anus) TENDERNESS IN MOUTH AND THROAT WITH OR WITHOUT PRESENCE OF ULCERS (sore throat, sores in mouth, or a toothache) UNUSUAL RASH, SWELLING OR PAIN  UNUSUAL VAGINAL DISCHARGE OR ITCHING   Items with * indicate a potential emergency and should be followed up as soon as possible or go to the Emergency Department if any problems should occur.  Please show the CHEMOTHERAPY ALERT CARD or IMMUNOTHERAPY ALERT CARD at check-in to the  Emergency Department and triage nurse.  Should you have questions after your visit or need to cancel or reschedule your appointment, please contact [facility]  Dept: [phone]  and follow the prompts.  Office hours are 8:00 a.m. to 4:30 p.m. Monday - Friday. Please note that voicemails left after 4:00 p.m. may not be returned until the following business day.  We are closed weekends and major holidays. You have access to a nurse at all times for urgent questions. Please call the main number to the clinic Dept: [phone] and follow the prompts.   For any non-urgent questions, you may also contact your provider using MyChart. We now offer e-Visits for anyone 18 and older to request care online for non-urgent symptoms. For details visit [email or web address].   Also download the MyChart app! Go to the app store, search "MyChart", open the app, select [facility], and log in with your MyChart username and password.  Due to Covid, a mask is required upon entering the hospital/clinic. If you do not have a mask, one will be given to you upon arrival. For doctor visits, patients may have 1 support person aged 18 or older with them. For treatment visits, patients cannot have anyone with them due to current Covid guidelines and our immunocompromised population.   

## 2020-11-19 ENCOUNTER — Telehealth: Payer: Self-pay | Admitting: Internal Medicine

## 2020-11-19 NOTE — Telephone Encounter (Signed)
Scheduled appt per 9/14 los - mailed letter and spoke to patients sister to confirm appts.

## 2020-11-27 DIAGNOSIS — Z131 Encounter for screening for diabetes mellitus: Secondary | ICD-10-CM | POA: Diagnosis not present

## 2020-11-27 DIAGNOSIS — R739 Hyperglycemia, unspecified: Secondary | ICD-10-CM | POA: Diagnosis not present

## 2020-11-27 DIAGNOSIS — E7849 Other hyperlipidemia: Secondary | ICD-10-CM | POA: Diagnosis not present

## 2020-11-27 DIAGNOSIS — K219 Gastro-esophageal reflux disease without esophagitis: Secondary | ICD-10-CM | POA: Diagnosis not present

## 2020-11-27 DIAGNOSIS — I10 Essential (primary) hypertension: Secondary | ICD-10-CM | POA: Diagnosis not present

## 2020-11-27 DIAGNOSIS — E782 Mixed hyperlipidemia: Secondary | ICD-10-CM | POA: Diagnosis not present

## 2020-11-30 DIAGNOSIS — I714 Abdominal aortic aneurysm, without rupture: Secondary | ICD-10-CM | POA: Diagnosis not present

## 2020-11-30 DIAGNOSIS — R4582 Worries: Secondary | ICD-10-CM | POA: Diagnosis not present

## 2020-11-30 DIAGNOSIS — I10 Essential (primary) hypertension: Secondary | ICD-10-CM | POA: Diagnosis not present

## 2020-11-30 DIAGNOSIS — Z23 Encounter for immunization: Secondary | ICD-10-CM | POA: Diagnosis not present

## 2020-11-30 DIAGNOSIS — J449 Chronic obstructive pulmonary disease, unspecified: Secondary | ICD-10-CM | POA: Diagnosis not present

## 2020-11-30 DIAGNOSIS — C3490 Malignant neoplasm of unspecified part of unspecified bronchus or lung: Secondary | ICD-10-CM | POA: Diagnosis not present

## 2020-11-30 DIAGNOSIS — Z0001 Encounter for general adult medical examination with abnormal findings: Secondary | ICD-10-CM | POA: Diagnosis not present

## 2020-12-09 ENCOUNTER — Telehealth: Payer: Self-pay | Admitting: General Practice

## 2020-12-09 NOTE — Telephone Encounter (Signed)
[place] CSW Progress Notes  Call from sister, she is requesting Benay Spice fund gas cards to help him get to/from appts at [facility].  Cards will be left in [place] for pick up on Monday as undersigned CSW is not at work on that date.  Edwyna Shell, LCSW Clinical Social Worker Phone:  [phone]

## 2020-12-14 ENCOUNTER — Other Ambulatory Visit: Payer: Self-pay

## 2020-12-14 ENCOUNTER — Inpatient Hospital Stay: Payer: Medicare Other | Admitting: *Deleted

## 2020-12-14 ENCOUNTER — Inpatient Hospital Stay: Payer: Medicare Other | Attending: Internal Medicine

## 2020-12-14 DIAGNOSIS — C3490 Malignant neoplasm of unspecified part of unspecified bronchus or lung: Secondary | ICD-10-CM

## 2020-12-14 DIAGNOSIS — C3492 Malignant neoplasm of unspecified part of left bronchus or lung: Secondary | ICD-10-CM

## 2020-12-14 DIAGNOSIS — C3412 Malignant neoplasm of upper lobe, left bronchus or lung: Secondary | ICD-10-CM | POA: Diagnosis not present

## 2020-12-14 LAB — CBC WITH DIFFERENTIAL (CANCER CENTER ONLY)
Abs Immature Granulocytes: 0.01 10*3/uL (ref 0.00–0.07)
Basophils Absolute: 0.1 10*3/uL (ref 0.0–0.1)
Basophils Relative: 1 %
Eosinophils Absolute: 0.2 10*3/uL (ref 0.0–0.5)
Eosinophils Relative: 4 %
HCT: 39.7 % (ref 39.0–52.0)
Hemoglobin: 13 g/dL (ref 13.0–17.0)
Immature Granulocytes: 0 %
Lymphocytes Relative: 25 %
Lymphs Abs: 1.5 10*3/uL (ref 0.7–4.0)
MCH: 27.1 pg (ref 26.0–34.0)
MCHC: 32.7 g/dL (ref 30.0–36.0)
MCV: 82.9 fL (ref 80.0–100.0)
Monocytes Absolute: 0.6 10*3/uL (ref 0.1–1.0)
Monocytes Relative: 10 %
Neutro Abs: 3.6 10*3/uL (ref 1.7–7.7)
Neutrophils Relative %: 60 %
Platelet Count: 245 10*3/uL (ref 150–400)
RBC: 4.79 MIL/uL (ref 4.22–5.81)
RDW: 18.4 % — ABNORMAL HIGH (ref 11.5–15.5)
WBC Count: 6 10*3/uL (ref 4.0–10.5)
nRBC: 0 % (ref 0.0–0.2)

## 2020-12-14 LAB — CMP (CANCER CENTER ONLY)
ALT: 14 U/L (ref 0–44)
AST: 17 U/L (ref 15–41)
Albumin: 3.2 g/dL — ABNORMAL LOW (ref 3.5–5.0)
Alkaline Phosphatase: 88 U/L (ref 38–126)
Anion gap: 8 (ref 5–15)
BUN: 23 mg/dL (ref 8–23)
CO2: 23 mmol/L (ref 22–32)
Calcium: 9 mg/dL (ref 8.9–10.3)
Chloride: 107 mmol/L (ref 98–111)
Creatinine: 1.11 mg/dL (ref 0.61–1.24)
GFR, Estimated: >60 mL/min (ref >60)
Glucose, Bld: 103 mg/dL — ABNORMAL HIGH (ref 70–99)
Potassium: 4.1 mmol/L (ref 3.5–5.1)
Sodium: 138 mmol/L (ref 135–145)
Total Bilirubin: 0.5 mg/dL (ref 0.3–1.2)
Total Protein: 7.4 g/dL (ref 6.5–8.1)

## 2020-12-14 NOTE — Progress Notes (Signed)
[place] cards provided to patient.   Johnnye Lana, MSW, LCSW, OSW-C Clinical Social Worker [facility] [phone]

## 2020-12-15 ENCOUNTER — Telehealth: Payer: Self-pay | Admitting: *Deleted

## 2020-12-15 ENCOUNTER — Inpatient Hospital Stay (HOSPITAL_BASED_OUTPATIENT_CLINIC_OR_DEPARTMENT_OTHER): Payer: Medicare Other | Admitting: Internal Medicine

## 2020-12-15 ENCOUNTER — Telehealth: Payer: Self-pay | Admitting: Internal Medicine

## 2020-12-15 VITALS — BP 168/71 | HR 70 | Temp 97.1°F | Resp 20 | Ht 74.0 in

## 2020-12-15 DIAGNOSIS — C3412 Malignant neoplasm of upper lobe, left bronchus or lung: Secondary | ICD-10-CM

## 2020-12-15 DIAGNOSIS — C3490 Malignant neoplasm of unspecified part of unspecified bronchus or lung: Secondary | ICD-10-CM

## 2020-12-15 NOTE — Progress Notes (Signed)
[facility] [phone]   [phone]  OFFICE PROGRESS NOTE  [name], MD [address] 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  Diagnosed in 2019. 2) stage Ia3 (T1cN0M0) non-small cell lung cancer.  Diagnosed with a left suprahilar region with AP window adenopathy.  Status post SBRT 3) renal cell carcinoma status post CT-guided ablation in July 2020.  Followed by Dr. [name]. 4) recurrent non-small cell lung cancer in July 2021.  Patient presented with new adenopathy in the AP window. Inaccessible to biopsy   PRIOR THERAPY:  1) SBRT to the LUL nodule in 2019 under the care of Dr. Lisbeth Renshaw 2) SBRT to the left suprahilar mass under the care of Dr. Lisbeth Renshaw in March 2020 3) CT guided ablation to the renal cell carcinoma under the care of Dr. Kathlene Cote. 40 Concurrent chemoradiation with carboplatin for an AUC of 2 and paclitaxel 45 mg/m.  First dose on 09/30/2019.  Status post 6 cycles.  Last cycle was given on 11/12/2019 with partial response. 4) Consolidation treatment with immunotherapy with Imfinzi 1500 mg IV every 4 weeks.  First dose December 18, 2019.  Status post 13 cycles.     CURRENT THERAPY: Observation.  INTERVAL HISTORY: Reginald Berry 70 y.o. male returns to the clinic today for follow-up visit accompanied by his sister.  The patient is feeling fine today with no concerning complaints except for mild fatigue.  He denied having any current chest pain, shortness of breath, cough or hemoptysis.  He denied having any fever or chills.  He has no nausea, vomiting, diarrhea or constipation.  He denied having any headache or visual changes.  He was supposed to have repeat CT scan of the chest before this visit but unfortunately it was not scheduled as ordered.    MEDICAL HISTORY: Past Medical History:  Diagnosis Date   Aortic atherosclerosis (HCC)     Arthritis    Chronic low back pain    COPD (chronic obstructive pulmonary disease) (HCC)    Coronary artery calcification seen on CT scan    Multivessel   Essential hypertension    Gait instability    Previous logging accident   GERD (gastroesophageal reflux disease)    Lung cancer ([facility])    Poorly differentiated, XRT   Renal cell carcinoma ([facility])    Left, status post cryoablation August 2020    ALLERGIES:  has No Known Allergies.  MEDICATIONS:  Current Outpatient Medications  Medication Sig Dispense Refill   ALPRAZolam (XANAX) 0.5 MG tablet Take 0.5 mg by mouth daily as needed.     amLODipine (NORVASC) 10 MG tablet Take 10 mg by mouth daily.     aspirin 81 MG EC tablet Take 81 mg by mouth daily. Swallow whole.     buPROPion (WELLBUTRIN XL) 300 MG 24 hr tablet Take 300 mg by mouth daily.     busPIRone (BUSPAR) 15 MG tablet Take 15 mg by mouth daily.     cetirizine (ZYRTEC) 10 MG tablet Take 10 mg by mouth daily as needed for allergies.     furosemide (LASIX) 40 MG tablet Take 40 mg by mouth daily.      hydrochlorothiazide (MICROZIDE) 12.5 MG capsule Take 12.5 mg by mouth daily.     liothyronine (CYTOMEL) 25 MCG tablet Take 25 mcg by mouth daily.     losartan (COZAAR) 100  MG tablet Take 100 mg by mouth daily.     Multiple Vitamins-Minerals (CENTRUM SILVER 50+MEN) TABS Take 1 tablet by mouth daily.     oxyCODONE-acetaminophen (PERCOCET) 10-325 MG tablet Take 1 tablet by mouth every 4 (four) hours as needed for pain.     pantoprazole (PROTONIX) 40 MG tablet Take 40 mg by mouth every evening.      Probiotic Product (PROBIOTIC DAILY PO) Take 1 capsule by mouth daily.     prochlorperazine (COMPAZINE) 10 MG tablet TAKE ONE TABLET EVERY 6 HOURS AS NEEDED 30 tablet 2   rosuvastatin (CRESTOR) 5 MG tablet Take 5 mg by mouth at bedtime.     sildenafil (VIAGRA) 100 MG tablet Take 100 mg by mouth daily as needed for erectile dysfunction.     No current facility-administered medications for  this visit.    SURGICAL HISTORY:  Past Surgical History:  Procedure Laterality Date   CHOLECYSTECTOMY     2019   FRACTURE SURGERY     bil legs (logging trees)   IR RADIOLOGIST EVAL & MGMT  09/27/2018   IR RADIOLOGIST EVAL & MGMT  11/22/2018   IR RADIOLOGIST EVAL & MGMT  04/10/2019   IR RADIOLOGIST EVAL & MGMT  10/13/2020   NECK SURGERY  1994-1995   RADIOLOGY WITH ANESTHESIA Left 10/31/2018   Procedure: CT WITH ANESTHESIA RENAL CRYO ABLATION;  Surgeon: Aletta Edouard, MD;  Location: [facility];  Service: Radiology;  Laterality: Left;    REVIEW OF SYSTEMS:  A comprehensive review of systems was negative except for: Constitutional: positive for fatigue Respiratory: positive for dyspnea on exertion   PHYSICAL EXAMINATION: General appearance: alert, cooperative, fatigued, and no distress Head: Normocephalic, without obvious abnormality, atraumatic Neck: no adenopathy, no JVD, supple, symmetrical, trachea midline, and thyroid not enlarged, symmetric, no tenderness/mass/nodules Lymph nodes: Cervical, supraclavicular, and axillary nodes normal. Resp: clear to auscultation bilaterally Back: symmetric, no curvature. ROM normal. No CVA tenderness. Cardio: regular rate and rhythm, S1, S2 normal, no murmur, click, rub or gallop GI: soft, non-tender; bowel sounds normal; no masses,  no organomegaly Extremities: extremities normal, atraumatic, no cyanosis or edema  ECOG PERFORMANCE STATUS: 1 - Symptomatic but completely ambulatory  Blood pressure (!) 168/71, pulse 70, temperature (!) 97.1 F (36.2 C), temperature source Tympanic, resp. rate 20, height 6\' 2"  (1.88 m), SpO2 100 %.  LABORATORY DATA: Lab Results  Component Value Date   WBC 6.0 12/14/2020   HGB 13.0 12/14/2020   HCT 39.7 12/14/2020   MCV 82.9 12/14/2020   PLT 245 12/14/2020      Chemistry      Component Value Date/Time   NA 138 12/14/2020 0811   K 4.1 12/14/2020 0811   CL 107 12/14/2020 0811   CO2 23 12/14/2020 0811   BUN 23  12/14/2020 0811   CREATININE 1.11 12/14/2020 0811      Component Value Date/Time   CALCIUM 9.0 12/14/2020 0811   ALKPHOS 88 12/14/2020 0811   AST 17 12/14/2020 0811   ALT 14 12/14/2020 0811   BILITOT 0.5 12/14/2020 0811       RADIOGRAPHIC STUDIES: No results found.  ASSESSMENT AND PLAN: This is a very pleasant 70 years old white male with recurrent non-small cell lung cancer presenting as a stage IIIa with mediastinal lymphadenopathy in July 2021.  The patient has a history of early stage non-small cell lung cancer, adenosquamous carcinoma as stage Ib of the left upper lobe status post SBRT in 2019.  He also has a  stage Ia3 non-small cell lung cancer status post SBRT to the left suprahilar region and AP window adenopathy. He completed a course of concurrent chemoradiation with weekly carboplatin and paclitaxel status post 6 cycles and tolerated his treatment well. The patient completed treatment with immunotherapy with Imfinzi 1500 mg IV every 4 weeks status post 13 cycles.   The patient has been tolerating this treatment well with no concerning adverse effect except for mild fatigue. He was supposed to have repeat CT scan of the chest before this visit for restaging of his disease after completion of the immunotherapy but unfortunately his scan was not scheduled as ordered. I will try to get his scan done in the next few days. If the scan showed no concerning findings for disease progression, I will see him back for follow-up visit in 3 months with repeat CT scan of the chest but if there is any concerning findings, I will see him sooner for further recommendation. The patient was advised to call immediately if he has any other concerning symptoms in the interval.  The patient voices understanding of current disease status and treatment options and is in agreement with the current care plan. All questions were answered. The patient knows to call the clinic with any problems, questions or  concerns. We can certainly see the patient much sooner if necessary.   Disclaimer: This note was dictated with voice recognition software. Similar sounding words can inadvertently be transcribed and may not be corrected upon review.

## 2020-12-15 NOTE — Telephone Encounter (Signed)
Per Dr.Mohamed, pt was scheduled for CT scan for 10/13 at [place] at [facility]. Pt was given contrast and instructions. Pt and family member verbalized understanding.

## 2020-12-15 NOTE — Telephone Encounter (Signed)
Scheduled follow-up appointments per 10/11 los. Patient's sister is aware. Mailed calendar.

## 2020-12-17 ENCOUNTER — Other Ambulatory Visit: Payer: Self-pay

## 2020-12-17 ENCOUNTER — Ambulatory Visit (HOSPITAL_COMMUNITY)
Admission: RE | Admit: 2020-12-17 | Discharge: 2020-12-17 | Disposition: A | Discharge status: Home / Self Care | Payer: Medicare Other | Source: Ambulatory Visit | Attending: Internal Medicine | Admitting: Internal Medicine

## 2020-12-17 ENCOUNTER — Encounter (HOSPITAL_COMMUNITY): Payer: Self-pay

## 2020-12-17 DIAGNOSIS — C3490 Malignant neoplasm of unspecified part of unspecified bronchus or lung: Secondary | ICD-10-CM | POA: Diagnosis not present

## 2020-12-17 DIAGNOSIS — R911 Solitary pulmonary nodule: Secondary | ICD-10-CM | POA: Diagnosis not present

## 2020-12-17 DIAGNOSIS — I700 Atherosclerosis of aorta: Secondary | ICD-10-CM | POA: Diagnosis not present

## 2020-12-17 DIAGNOSIS — J439 Emphysema, unspecified: Secondary | ICD-10-CM | POA: Diagnosis not present

## 2020-12-17 MED ORDER — IOHEXOL 350 MG/ML SOLN
60.0000 mL | Freq: Once | INTRAVENOUS | Status: AC | PRN
  Administered 2020-12-17: 60 mL via INTRAVENOUS

## 2020-12-21 ENCOUNTER — Other Ambulatory Visit: Payer: Self-pay | Admitting: Internal Medicine

## 2020-12-21 ENCOUNTER — Telehealth: Payer: Self-pay | Admitting: Internal Medicine

## 2020-12-21 ENCOUNTER — Telehealth: Payer: Self-pay | Admitting: Medical Oncology

## 2020-12-21 DIAGNOSIS — C3490 Malignant neoplasm of unspecified part of unspecified bronchus or lung: Secondary | ICD-10-CM

## 2020-12-21 NOTE — Telephone Encounter (Signed)
Pt notified of Dr Worthy Flank note.  "Why can't I just get that lung taken out? I can live with one lung"   Schedule message sent for f/u a few days after PET scan.

## 2020-12-21 NOTE — Telephone Encounter (Signed)
-----   Message from Curt Bears, MD sent at 12/21/2020  8:40 AM EDT ----- Please let the patient know that the scan showed a suspicious spot in the lung and we need to evaluate it with a PET scan.  I ordered the PET scan and he will need a follow-up appointment with me in 2 weeks to discuss results. ----- Message ----- From: Interface, Rad Results In Sent: 12/18/2020   1:07 PM EDT To: Curt Bears, MD

## 2020-12-21 NOTE — Telephone Encounter (Signed)
Scheduled per sch msg. Called and spoke with patients sister. Confirmed appt. Mailed printout per request

## 2020-12-22 ENCOUNTER — Encounter: Payer: Self-pay | Admitting: Internal Medicine

## 2020-12-28 DIAGNOSIS — Z9181 History of falling: Secondary | ICD-10-CM | POA: Diagnosis not present

## 2021-01-04 ENCOUNTER — Other Ambulatory Visit: Payer: Self-pay

## 2021-01-04 ENCOUNTER — Ambulatory Visit (HOSPITAL_COMMUNITY)
Admission: RE | Admit: 2021-01-04 | Discharge: 2021-01-04 | Disposition: A | Discharge status: Home / Self Care | Payer: Medicare Other | Source: Ambulatory Visit | Attending: Internal Medicine | Admitting: Internal Medicine

## 2021-01-04 DIAGNOSIS — C3490 Malignant neoplasm of unspecified part of unspecified bronchus or lung: Secondary | ICD-10-CM | POA: Insufficient documentation

## 2021-01-04 LAB — GLUCOSE, CAPILLARY: Glucose-Capillary: 116 mg/dL — ABNORMAL HIGH (ref 70–99)

## 2021-01-04 MED ORDER — FLUDEOXYGLUCOSE F - 18 (FDG) INJECTION
15.8400 | Freq: Once | INTRAVENOUS | Status: AC | PRN
  Administered 2021-01-04: 15.84 via INTRAVENOUS

## 2021-01-05 DIAGNOSIS — M25512 Pain in left shoulder: Secondary | ICD-10-CM | POA: Diagnosis not present

## 2021-01-05 DIAGNOSIS — M6281 Muscle weakness (generalized): Secondary | ICD-10-CM | POA: Diagnosis not present

## 2021-01-05 DIAGNOSIS — R2681 Unsteadiness on feet: Secondary | ICD-10-CM | POA: Diagnosis not present

## 2021-01-07 ENCOUNTER — Encounter: Payer: Self-pay | Admitting: Internal Medicine

## 2021-01-07 ENCOUNTER — Other Ambulatory Visit: Payer: Self-pay

## 2021-01-07 ENCOUNTER — Inpatient Hospital Stay: Payer: Medicare Other | Attending: Internal Medicine | Admitting: Internal Medicine

## 2021-01-07 VITALS — BP 147/47 | HR 72 | Temp 98.4°F | Resp 18 | Ht 74.0 in

## 2021-01-07 DIAGNOSIS — C3412 Malignant neoplasm of upper lobe, left bronchus or lung: Secondary | ICD-10-CM | POA: Diagnosis not present

## 2021-01-07 DIAGNOSIS — C3492 Malignant neoplasm of unspecified part of left bronchus or lung: Secondary | ICD-10-CM

## 2021-01-07 NOTE — Progress Notes (Signed)
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  Diagnosed in 2019. 2) stage Ia3 (T1cN0M0) non-small cell lung cancer.  Diagnosed with a left suprahilar region with AP window adenopathy.  Status post SBRT 3) renal cell carcinoma status post CT-guided ablation in July 2020.  Followed by Dr. [name]. 4) recurrent non-small cell lung cancer in July 2021.  Patient presented with new adenopathy in the AP window. Inaccessible to biopsy   PRIOR THERAPY:  1) SBRT to the LUL nodule in 2019 under the care of Dr. Lisbeth Renshaw 2) SBRT to the left suprahilar mass under the care of Dr. Lisbeth Renshaw in March 2020 3) CT guided ablation to the renal cell carcinoma under the care of Dr. Kathlene Cote. 40 Concurrent chemoradiation with carboplatin for an AUC of 2 and paclitaxel 45 mg/m.  First dose on 09/30/2019.  Status post 6 cycles.  Last cycle was given on 11/12/2019 with partial response. 4) Consolidation treatment with immunotherapy with Imfinzi 1500 mg IV every 4 weeks.  First dose December 18, 2019.  Status post 13 cycles.     CURRENT THERAPY: Observation.  INTERVAL HISTORY: AKSHAJ BESANCON 70 y.o. male returns to the clinic today for follow-up visit accompanied by his sister.  The patient is feeling fine today with no concerning complaints except for shortness of breath with exertion and mild fatigue.  He denied having any current chest pain, cough or hemoptysis.  He denied having any recent weight loss or night sweats.  He has no nausea, vomiting, diarrhea or constipation.  He denied having any headache or visual changes.  He had repeat CT scan of the chest performed recently that showed suspicious disease recurrence.  I ordered a PET scan which was performed few days ago and the patient is here today  for evaluation and discussion of his PET scan results and treatment options.   MEDICAL HISTORY: Past Medical History:  Diagnosis Date   Aortic atherosclerosis (HCC)    Arthritis    Chronic low back pain    COPD (chronic obstructive pulmonary disease) (HCC)    Coronary artery calcification seen on CT scan    Multivessel   Essential hypertension    Gait instability    Previous logging accident   GERD (gastroesophageal reflux disease)    Lung cancer ([facility])    Poorly differentiated, XRT   Renal cell carcinoma ([facility])    Left, status post cryoablation August 2020    ALLERGIES:  has No Known Allergies.  MEDICATIONS:  Current Outpatient Medications  Medication Sig Dispense Refill   ALPRAZolam (XANAX) 0.5 MG tablet Take 0.5 mg by mouth daily as needed.     amLODipine (NORVASC) 10 MG tablet Take 10 mg by mouth daily.     aspirin 81 MG EC tablet Take 81 mg by mouth daily. Swallow whole.     buPROPion (WELLBUTRIN XL) 300 MG 24 hr tablet Take 300 mg by mouth daily.     busPIRone (BUSPAR) 15 MG tablet Take 15 mg by mouth daily.     cetirizine (ZYRTEC) 10 MG tablet Take 10 mg by mouth daily as needed for allergies.     furosemide (LASIX) 40 MG tablet Take 40 mg by mouth daily.      hydrochlorothiazide (MICROZIDE) 12.5 MG capsule  Take 12.5 mg by mouth daily.     liothyronine (CYTOMEL) 25 MCG tablet Take 25 mcg by mouth daily.     losartan (COZAAR) 100 MG tablet Take 100 mg by mouth daily.     Multiple Vitamins-Minerals (CENTRUM SILVER 50+MEN) TABS Take 1 tablet by mouth daily.     oxyCODONE-acetaminophen (PERCOCET) 10-325 MG tablet Take 1 tablet by mouth every 4 (four) hours as needed for pain.     pantoprazole (PROTONIX) 40 MG tablet Take 40 mg by mouth every evening.      Probiotic Product (PROBIOTIC DAILY PO) Take 1 capsule by mouth daily.     prochlorperazine (COMPAZINE) 10 MG tablet TAKE ONE TABLET EVERY 6 HOURS AS NEEDED 30 tablet 2   rosuvastatin (CRESTOR) 5 MG tablet Take 5 mg by  mouth at bedtime.     sildenafil (VIAGRA) 100 MG tablet Take 100 mg by mouth daily as needed for erectile dysfunction.     No current facility-administered medications for this visit.    SURGICAL HISTORY:  Past Surgical History:  Procedure Laterality Date   CHOLECYSTECTOMY     2019   FRACTURE SURGERY     bil legs (logging trees)   IR RADIOLOGIST EVAL & MGMT  09/27/2018   IR RADIOLOGIST EVAL & MGMT  11/22/2018   IR RADIOLOGIST EVAL & MGMT  04/10/2019   IR RADIOLOGIST EVAL & MGMT  10/13/2020   NECK SURGERY  1994-1995   RADIOLOGY WITH ANESTHESIA Left 10/31/2018   Procedure: CT WITH ANESTHESIA RENAL CRYO ABLATION;  Surgeon: Aletta Edouard, MD;  Location: [facility];  Service: Radiology;  Laterality: Left;    REVIEW OF SYSTEMS:  Constitutional: positive for fatigue Eyes: negative Ears, nose, mouth, throat, and face: negative Respiratory: positive for dyspnea on exertion Cardiovascular: negative Gastrointestinal: negative Genitourinary:negative Integument/breast: negative Hematologic/lymphatic: negative Musculoskeletal:negative Neurological: negative Behavioral/Psych: negative Endocrine: negative Allergic/Immunologic: negative   PHYSICAL EXAMINATION: General appearance: alert, cooperative, fatigued, and no distress Head: Normocephalic, without obvious abnormality, atraumatic Neck: no adenopathy, no JVD, supple, symmetrical, trachea midline, and thyroid not enlarged, symmetric, no tenderness/mass/nodules Lymph nodes: Cervical, supraclavicular, and axillary nodes normal. Resp: clear to auscultation bilaterally Back: symmetric, no curvature. ROM normal. No CVA tenderness. Cardio: regular rate and rhythm, S1, S2 normal, no murmur, click, rub or gallop GI: soft, non-tender; bowel sounds normal; no masses,  no organomegaly Extremities: extremities normal, atraumatic, no cyanosis or edema Neurologic: Alert and oriented X 3, normal strength and tone. Normal symmetric reflexes. Normal  coordination and gait  ECOG PERFORMANCE STATUS: 1 - Symptomatic but completely ambulatory  Blood pressure (!) 147/47, pulse 72, temperature 98.4 F (36.9 C), temperature source Temporal, resp. rate 18, height 6\' 2"  (1.88 m), SpO2 100 %.  LABORATORY DATA: Lab Results  Component Value Date   WBC 6.0 12/14/2020   HGB 13.0 12/14/2020   HCT 39.7 12/14/2020   MCV 82.9 12/14/2020   PLT 245 12/14/2020      Chemistry      Component Value Date/Time   NA 138 12/14/2020 0811   K 4.1 12/14/2020 0811   CL 107 12/14/2020 0811   CO2 23 12/14/2020 0811   BUN 23 12/14/2020 0811   CREATININE 1.11 12/14/2020 0811      Component Value Date/Time   CALCIUM 9.0 12/14/2020 0811   ALKPHOS 88 12/14/2020 0811   AST 17 12/14/2020 0811   ALT 14 12/14/2020 0811   BILITOT 0.5 12/14/2020 0811       RADIOGRAPHIC STUDIES: CT Chest W Contrast  Result Date: 12/18/2020 CLINICAL DATA:  69 year old male with history of non-small cell lung cancer, status post SBRT in 2019, followed by repeat SBRT in May 2020 for separate left upper lobe lesion. Radiation therapy to the mediastinum completed in September 2021. Restaging examination. EXAM: CT CHEST WITH CONTRAST TECHNIQUE: Multidetector CT imaging of the chest was performed during intravenous contrast administration. CONTRAST:  55mL OMNIPAQUE IOHEXOL 350 MG/ML SOLN COMPARISON:  Chest CT 08/17/2020. FINDINGS: Cardiovascular: Heart size is normal. There is no significant pericardial fluid, thickening or pericardial calcification. There is aortic atherosclerosis, as well as atherosclerosis of the great vessels of the mediastinum and the coronary arteries, including calcified atherosclerotic plaque in the left main, left anterior descending, left circumflex and right coronary arteries. Mediastinum/Nodes: No definite pathologically enlarged mediastinal or hilar lymph nodes. Esophagus is unremarkable in appearance. No axillary lymphadenopathy. Lungs/Pleura: There continues  to be extensive mass-like architectural distortion in the central aspect of the left upper lobe, similar to prior examinations, compatible with chronic postradiation mass-like fibrosis. However, today's study now clearly demonstrates a large heterogeneously enhancing mass in the periphery of the left upper lobe (axial image 39 of series 2 and coronal image 118 of series 5), measuring 6.3 x 3.8 x 2.6 cm concerning for recurrent neoplasm. No other definite suspicious appearing pulmonary nodule or mass are noted. Diffuse bronchial wall thickening with mild centrilobular and paraseptal emphysema. No acute consolidative airspace disease. Trace amount of chronic pleural thickening and fluid in the posterior aspect of the upper left hemithorax, unchanged. No right pleural effusion. Upper Abdomen: 1.8 x 1.3 cm low-attenuation right adrenal nodule and 2.3 x 1.6 cm low-attenuation left adrenal nodule, compatible with adrenal adenomas, similar to prior studies. Status post cholecystectomy. Aortic atherosclerosis. Musculoskeletal: Old minimally displaced fractures of the posterolateral aspect of the left third and fourth ribs, similar to the prior study, likely pathologic related to chronic radiation osteitis. There are no aggressive appearing lytic or blastic lesions noted in the visualized portions of the skeleton. IMPRESSION: 1. Interval development of a 6.3 x 3.8 x 2.6 cm mass in the periphery of the left upper lobe, highly concerning for recurrent neoplasm. Further evaluation with PET-CT is strongly recommended in the near future to better evaluate this finding. 2. Aortic atherosclerosis, in addition to left main and 3 vessel coronary artery disease. 3. Diffuse bronchial wall thickening with mild centrilobular and paraseptal emphysema; imaging findings suggestive of underlying COPD. 4. Additional incidental findings, similar to prior studies, as above. These results will be called to the ordering clinician or  representative by the Radiologist Assistant, and communication documented in the PACS or [employer]. Aortic Atherosclerosis (ICD10-I70.0) and Emphysema (ICD10-J43.9). Electronically Signed   By: Vinnie Langton M.D.   On: 12/18/2020 13:05   NM PET Image Restage (PS) Skull Base to Thigh (F-18 FDG)  Result Date: 01/04/2021 CLINICAL DATA:  Subsequent treatment strategy for non-small cell lung cancer. EXAM: NUCLEAR MEDICINE PET SKULL BASE TO THIGH TECHNIQUE: 15.8 mCi F-18 FDG was injected intravenously. Full-ring PET imaging was performed from the skull base to thigh after the radiotracer. CT data was obtained and used for attenuation correction and anatomic localization. Fasting blood glucose: 116 mg/dl COMPARISON:  CT chest dated 12/17/2020.  PET-CT dated 09/17/2019. FINDINGS: Mediastinal blood pool activity: SUV max 3.6 Liver activity: SUV max NA NECK: Bilateral parotid lesions with mild hypermetabolism, chronic. 8 mm short axis node in the left neck inferior to the left parotid gland (series 4/image 31), max SUV 15.5, previously 8 mm  with max SUV 9.1. Incidental CT findings: none CHEST: 6.8 cm posterior left upper lobe mass extending to the left perihilar region (series 8/image 19), max SUV 19.6, compatible with primary bronchogenic neoplasm. Mild mediastinal lymphadenopathy, including: --10 mm short axis left superior mediastinal node (series 4/image 87), max SUV 6.0 --9 mm short axis subcarinal node (series 4/image 82), max SUV 4.2 Incidental CT findings: Mild atherosclerotic calcifications of the arch. Mild three-coronary atherosclerosis. ABDOMEN/PELVIS: No abnormal hypermetabolism in the liver, spleen, pancreas, or adrenal glands. No hypermetabolic abdominopelvic lymphadenopathy. Incidental CT findings: Status post cholecystectomy. Atherosclerotic calcifications abdominal aorta and branch vessels. Right renal cysts. Tiny bilateral inguinal hernias. SKELETON: Involvement of the left posterolateral 3rd  rib (series 4/image 62). Involvement of the left posterolateral 4th rib with pathologic fracture (series 4/image 59). Incidental CT findings: Degenerative changes of the visualized thoracolumbar spine. IMPRESSION: 6.8 cm posterior left upper lobe mass, compatible with primary bronchogenic neoplasm, extending to the left perihilar region. Overlying involvement of the left posterolateral 3rd and 4th ribs with associated pathologic fracture. Mild mediastinal nodal metastases, as above. Electronically Signed   By: Julian Hy M.D.   On: 01/04/2021 20:13    ASSESSMENT AND PLAN: This is a very pleasant 70 years old white male with recurrent non-small cell lung cancer presenting as a stage IIIa with mediastinal lymphadenopathy in July 2021.  The patient has a history of early stage non-small cell lung cancer, adenosquamous carcinoma as stage Ib of the left upper lobe status post SBRT in 2019.  He also has a stage Ia3 non-small cell lung cancer status post SBRT to the left suprahilar region and AP window adenopathy. He completed a course of concurrent chemoradiation with weekly carboplatin and paclitaxel status post 6 cycles and tolerated his treatment well. The patient completed treatment with immunotherapy with Imfinzi 1500 mg IV every 4 weeks status post 13 cycles.   The patient has been tolerating this treatment well with no concerning adverse effect except for mild fatigue. The patient had CT scan of the chest on December 18, 2020 that showed interval development of 6.3 x 3.8 x 2.6 cm mass in the periphery of the left upper lobe concerning for recurrent neoplasm. I ordered a PET scan which was performed on January 04, 2021 and that showed the 6.8 cm posterior left upper lobe mass was hypermetabolic and compatible with primary bronchogenic neoplasm and extending to the left perihilar region with some involvement of of the left posterior-lateral third and fourth ribs associated with pathologic fracture. I  personally and independently reviewed the scan images and discussed the results with the patient and his sister. I recommended for him to see pulmonary medicine for consideration of repeat bronchoscopy with biopsy of the new mass for confirmation of his tissue diagnosis as well as the histology. I will see him back for follow-up visit in around 2-3 weeks for evaluation and discussion of his treatment options based on the final pathology. The patient and his sister agreed to the current plan. He was advised to call immediately if he has any other concerning symptoms in the interval.  The patient voices understanding of current disease status and treatment options and is in agreement with the current care plan. All questions were answered. The patient knows to call the clinic with any problems, questions or concerns. We can certainly see the patient much sooner if necessary.   Disclaimer: This note was dictated with voice recognition software. Similar sounding words can inadvertently be transcribed and may not  be corrected upon review.

## 2021-01-08 ENCOUNTER — Encounter: Payer: Self-pay | Admitting: Medical Oncology

## 2021-01-08 ENCOUNTER — Encounter: Payer: Self-pay | Admitting: Internal Medicine

## 2021-01-08 ENCOUNTER — Telehealth: Payer: Self-pay | Admitting: Medical Oncology

## 2021-01-08 NOTE — Telephone Encounter (Signed)
Requests letter sent to confirm pt was seen yesterday . Please fax to [place]. Done.

## 2021-01-09 DIAGNOSIS — M6281 Muscle weakness (generalized): Secondary | ICD-10-CM | POA: Diagnosis not present

## 2021-01-09 DIAGNOSIS — M25512 Pain in left shoulder: Secondary | ICD-10-CM | POA: Diagnosis not present

## 2021-01-09 DIAGNOSIS — R2681 Unsteadiness on feet: Secondary | ICD-10-CM | POA: Diagnosis not present

## 2021-01-11 ENCOUNTER — Telehealth: Payer: Self-pay | Admitting: Internal Medicine

## 2021-01-11 ENCOUNTER — Telehealth: Payer: Self-pay | Admitting: Medical Oncology

## 2021-01-11 NOTE — Telephone Encounter (Signed)
Scheduled follow-up appointment per 11/3 los. Patient's sister is aware.

## 2021-01-11 NOTE — Telephone Encounter (Signed)
Pt has not heard from pulmonary appt for biopsy. I told wife it may be later this week before he gets a appt.

## 2021-01-15 DIAGNOSIS — H905 Unspecified sensorineural hearing loss: Secondary | ICD-10-CM | POA: Diagnosis not present

## 2021-01-20 ENCOUNTER — Telehealth: Payer: Self-pay | Admitting: Pulmonary Disease

## 2021-01-20 ENCOUNTER — Ambulatory Visit (INDEPENDENT_AMBULATORY_CARE_PROVIDER_SITE_OTHER): Payer: Medicare Other | Admitting: Pulmonary Disease

## 2021-01-20 ENCOUNTER — Encounter: Payer: Self-pay | Admitting: Pulmonary Disease

## 2021-01-20 ENCOUNTER — Other Ambulatory Visit: Payer: Self-pay

## 2021-01-20 VITALS — BP 142/70 | HR 81 | Temp 97.8°F | Ht 76.0 in | Wt 350.0 lb

## 2021-01-20 DIAGNOSIS — C3492 Malignant neoplasm of unspecified part of left bronchus or lung: Secondary | ICD-10-CM

## 2021-01-20 DIAGNOSIS — R942 Abnormal results of pulmonary function studies: Secondary | ICD-10-CM

## 2021-01-20 DIAGNOSIS — R918 Other nonspecific abnormal finding of lung field: Secondary | ICD-10-CM

## 2021-01-20 DIAGNOSIS — C3412 Malignant neoplasm of upper lobe, left bronchus or lung: Secondary | ICD-10-CM

## 2021-01-20 NOTE — Telephone Encounter (Signed)
I have called the pts sister and she is aware of the information that [facility] left for them.  She did write down all of the testing site information and the time and where he needed to be on 11/23 for the bronch.  Nothing further is needed.

## 2021-01-20 NOTE — Progress Notes (Signed)
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  Also had renal cell carcinoma CT-guided ablation in July 2020.  Now has concern for recurrent non-small cell disease with a new AP window node in July 2021 followed by Dr. Earlie Server.  Started on immunotherapy with Imfinzi in October 2021 status post 13 cycles.  Now had a repeat PET scan of the chest which was in January 04, 2021 by Dr. Earlie Server.  Patient was referred for consideration of tissue biopsy for a newly defined 6.8 cm posterior left upper lobe mass concerning for malignancy with associated left perihilar lesion.  Of note his initial diagnosis for malignancy was completed at [facility] per patient's sister who was present today in the office.  OV 01/20/2021: Here today to discuss biopsy of lung mass found on recent CT imaging.   Oncology History  Non-small cell carcinoma of left lung, stage 3 ([place])  09/25/2019 Initial Diagnosis   Non-small cell carcinoma of left lung, stage 3 ([place])   09/30/2019 - 11/12/2019 Chemotherapy   The patient had palonosetron (ALOXI) injection 0.25 mg, 0.25 mg, Intravenous,  Once, 6 of 7 cycles Administration: 0.25 mg (09/30/2019), 0.25 mg (11/04/2019), 0.25 mg (11/12/2019), 0.25 mg (10/07/2019), 0.25 mg (10/14/2019), 0.25 mg (10/30/2019) CARBOplatin (PARAPLATIN) 300 mg in sodium chloride 0.9 % 250 mL chemo infusion, 300 mg (100 % of original dose 300 mg), Intravenous,  Once, 6 of 7 cycles Dose  modification: 300 mg (original dose 300 mg, Cycle 1) Administration: 300 mg (09/30/2019), 300 mg (11/04/2019), 300 mg (11/12/2019), 300 mg (10/07/2019), 300 mg (10/14/2019), 300 mg (10/30/2019) PACLitaxel (TAXOL) 120 mg in sodium chloride 0.9 % 250 mL chemo infusion (</= 80mg /m2), 45 mg/m2 = 120 mg, Intravenous,  Once, 6 of 7 cycles Administration: 120 mg (09/30/2019), 120 mg (11/04/2019), 120 mg (11/12/2019), 120 mg (10/07/2019), 120 mg (10/14/2019), 120 mg (10/30/2019)   for chemotherapy treatment.     12/18/2019 -  Chemotherapy    Patient is on Treatment Plan: LUNG NSCLC DURVALUMAB Q28D          Past Medical History:  Diagnosis Date   Aortic atherosclerosis (HCC)    Arthritis    Chronic low back pain    COPD (chronic obstructive pulmonary disease) (HCC)    Coronary artery calcification seen on CT scan    Multivessel   Essential hypertension    Gait instability    Previous logging accident   GERD (gastroesophageal reflux disease)    Lung cancer (HCC)    Poorly differentiated, XRT   Renal cell carcinoma ([place])    Left, status post cryoablation August 2020     Family History  Problem Relation Age of Onset   Heart attack Mother    Renal Disease Mother    Heart failure Father    Heart attack Father    Diabetes Sister    Heart disease Brother      Past Surgical History:  Procedure Laterality Date  CHOLECYSTECTOMY     2019   FRACTURE SURGERY     bil legs (logging trees)   IR RADIOLOGIST EVAL & MGMT  09/27/2018   IR RADIOLOGIST EVAL & MGMT  11/22/2018   IR RADIOLOGIST EVAL & MGMT  04/10/2019   IR RADIOLOGIST EVAL & MGMT  10/13/2020   NECK SURGERY  1994-1995   RADIOLOGY WITH ANESTHESIA Left 10/31/2018   Procedure: CT WITH ANESTHESIA RENAL CRYO ABLATION;  Surgeon: Aletta Edouard, MD;  Location: [facility];  Service: Radiology;  Laterality: Left;    Social History   Socioeconomic History   Marital status: Single    Spouse name: Not on file   Number of children: Not on file   Years of  education: Not on file   Highest education level: Not on file  Occupational History   Not on file  Tobacco Use   Smoking status: Every Day    Packs/day: 0.50    Types: Cigarettes   Smokeless tobacco: Never   Tobacco comments:    10 a day  Vaping Use   Vaping Use: Never used  Substance and Sexual Activity   Alcohol use: Never    Comment: BEER ONCE IN A WHILE    Drug use: Never   Sexual activity: Not Currently  Other Topics Concern   Not on file  Social History Narrative   10-23-17 Unable to ask abuse questions wife and other family with him today.   Social Determinants of Health   Financial Resource Strain: Not on file  Food Insecurity: Not on file  Transportation Needs: Not on file  Physical Activity: Not on file  Stress: Not on file  Social Connections: Not on file  Intimate Partner Violence: Not on file     No Known Allergies   Outpatient Medications Prior to Visit  Medication Sig Dispense Refill   ALPRAZolam (XANAX) 0.5 MG tablet Take 0.5 mg by mouth daily as needed.     amLODipine (NORVASC) 10 MG tablet Take 10 mg by mouth daily.     aspirin 81 MG EC tablet Take 81 mg by mouth daily. Swallow whole.     buPROPion (WELLBUTRIN XL) 300 MG 24 hr tablet Take 300 mg by mouth daily.     busPIRone (BUSPAR) 15 MG tablet Take 15 mg by mouth daily.     cetirizine (ZYRTEC) 10 MG tablet Take 10 mg by mouth daily as needed for allergies.     furosemide (LASIX) 40 MG tablet Take 40 mg by mouth daily.      hydrochlorothiazide (MICROZIDE) 12.5 MG capsule Take 12.5 mg by mouth daily.     liothyronine (CYTOMEL) 25 MCG tablet Take 25 mcg by mouth daily.     losartan (COZAAR) 100 MG tablet Take 100 mg by mouth daily.     Multiple Vitamins-Minerals (CENTRUM SILVER 50+MEN) TABS Take 1 tablet by mouth daily.     oxyCODONE-acetaminophen (PERCOCET) 10-325 MG tablet Take 1 tablet by mouth every 4 (four) hours as needed for pain.     pantoprazole (PROTONIX) 40 MG tablet Take 40 mg by mouth  every evening.      Probiotic Product (PROBIOTIC DAILY PO) Take 1 capsule by mouth daily.     prochlorperazine (COMPAZINE) 10 MG tablet TAKE ONE TABLET EVERY 6 HOURS AS NEEDED 30 tablet 2   rosuvastatin (CRESTOR) 5 MG tablet Take 5 mg by mouth at bedtime.     sildenafil (VIAGRA) 100 MG tablet Take 100 mg by mouth daily as needed for  erectile dysfunction.     No facility-administered medications prior to visit.    Review of Systems  Constitutional:  Negative for chills, fever, malaise/fatigue and weight loss.  HENT:  Negative for hearing loss, sore throat and tinnitus.   Eyes:  Negative for blurred vision and double vision.  Respiratory:  Positive for shortness of breath. Negative for cough, hemoptysis, sputum production, wheezing and stridor.   Cardiovascular:  Negative for chest pain, palpitations, orthopnea, leg swelling and PND.  Gastrointestinal:  Negative for abdominal pain, constipation, diarrhea, heartburn, nausea and vomiting.  Genitourinary:  Negative for dysuria, hematuria and urgency.  Musculoskeletal:  Negative for joint pain and myalgias.  Skin:  Negative for itching and rash.  Neurological:  Negative for dizziness, tingling, weakness and headaches.  Endo/Heme/Allergies:  Negative for environmental allergies. Does not bruise/bleed easily.  Psychiatric/Behavioral:  Negative for depression. The patient is not nervous/anxious and does not have insomnia.   All other systems reviewed and are negative.   Objective:  Physical Exam Vitals reviewed.  Constitutional:      General: He is not in acute distress.    Appearance: He is well-developed. He is obese.  HENT:     Head: Normocephalic and atraumatic.  Eyes:     General: No scleral icterus.    Conjunctiva/sclera: Conjunctivae normal.     Pupils: Pupils are equal, round, and reactive to light.  Neck:     Vascular: No JVD.     Trachea: No tracheal deviation.  Cardiovascular:     Rate and Rhythm: Normal rate and regular  rhythm.     Heart sounds: Normal heart sounds. No murmur heard. Pulmonary:     Effort: Pulmonary effort is normal. No tachypnea, accessory muscle usage or respiratory distress.     Breath sounds: No stridor. No wheezing, rhonchi or rales.     Comments: Diminished breath sounds bilaterally, no crackles Abdominal:     General: Bowel sounds are normal. There is no distension.     Palpations: Abdomen is soft.     Tenderness: There is no abdominal tenderness.  Musculoskeletal:        General: No tenderness.     Cervical back: Neck supple.  Lymphadenopathy:     Cervical: No cervical adenopathy.  Skin:    General: Skin is warm and dry.     Capillary Refill: Capillary refill takes less than 2 seconds.     Findings: No rash.  Neurological:     Mental Status: He is alert and oriented to person, place, and time.  Psychiatric:        Behavior: Behavior normal.     Vitals:   01/20/21 1340  BP: (!) 142/70  Pulse: 81  Temp: 97.8 F (36.6 C)  TempSrc: Oral  SpO2: 98%  Weight: (!) 350 lb (158.8 kg)  Height: 6\' 4"  (1.93 m)   98% on RA BMI Readings from Last 3 Encounters:  01/20/21 42.60 kg/m  01/07/21 45.39 kg/m  12/15/20 45.39 kg/m   Wt Readings from Last 3 Encounters:  01/20/21 (!) 350 lb (158.8 kg)  05/06/20 (!) 353 lb 8 oz (160.3 kg)  12/10/19 (!) 346 lb (156.9 kg)     CBC    Component Value Date/Time   WBC 6.0 12/14/2020 0811   WBC 12.3 (H) 11/01/2018 0321   RBC 4.79 12/14/2020 0811   HGB 13.0 12/14/2020 0811   HCT 39.7 12/14/2020 0811   PLT 245 12/14/2020 0811   MCV 82.9 12/14/2020 0811   MCH 27.1  12/14/2020 0811   MCHC 32.7 12/14/2020 0811   RDW 18.4 (H) 12/14/2020 0811   LYMPHSABS 1.5 12/14/2020 0811   MONOABS 0.6 12/14/2020 0811   EOSABS 0.2 12/14/2020 0811   BASOSABS 0.1 12/14/2020 0811     Chest Imaging: October CT chest: Enlarging mass in the left upper lobe associated fibrotic changes status post radiation therapy. The patient's images have been  independently reviewed by me.    January 04, 2021 nuclear medicine PET scan: 6.8 cm posterior left upper lobe mass concerning for primary malignancy extending into the left hilum.  Possible nodal metastasis.  Overall imaging is concerning for potential recurrence of disease. The patient's images have been independently reviewed by me.    Pulmonary Functions Testing Results: PFT Results Latest Ref Rng & Units 08/03/2017  FVC-Pre L 4.62  FVC-Predicted Pre % 87  FVC-Post L 4.42  FVC-Predicted Post % 83  Pre FEV1/FVC % % 73  Post FEV1/FCV % % 76  FEV1-Pre L 3.35  FEV1-Predicted Pre % 84  FEV1-Post L 3.38  DLCO uncorrected ml/min/mmHg 32.50  DLCO UNC% % 86  DLVA Predicted % 86  TLC L 8.61  TLC % Predicted % 110  RV % Predicted % 154    FeNO:   Pathology:  Please see oncology history as stated above in HPI with history of lung cancer.  Echocardiogram:   Heart Catheterization:     Assessment & Plan:     ICD-10-CM   1. Lung mass  R91.8 Ambulatory referral to Pulmonology    Procedural/ Surgical Case Request: VIDEO BRONCHOSCOPY WITH ENDOBRONCHIAL NAVIGATION, VIDEO BRONCHOSCOPY WITH ENDOBRONCHIAL ULTRASOUND    2. Non-small cell carcinoma of left lung, stage 3 (HCC)  C34.92     3. Malignant neoplasm of upper lobe of left lung (HCC)  C34.12     4. Abnormal PET of left lung  R94.2       Discussion: This is a 70 year old gentleman a history of stage I lung cancer status post SBRT, followed by additional stage III non-small cell lung cancer treated with chemo and radiation and immune therapy.  Subsequent CT imaging shows enlarging left mass, PET scan with hypermetabolic uptake concerning for either a new malignancy or recurrence of disease.  Patient was referred for medical oncology for repeat biopsy of enlarging lung mass.  Plan: Today in the office we discussed risk benefits alternatives of proceeding with bronchoscopy. We discussed the utility of tissue sampling with the  setting of possible recurrence of disease. We discussed the risk of bleeding and pneumothorax. Patient is not on any blood thinners or anticoagulants. He is agreeable to proceed. We will try to get this done as soon as possible. Tentatively scheduled for 01/27/2021. We appreciate endoscopy staff willing to work with [place] on getting additional patients in.  Return to clinic in approximately 1 week after case to see me or APP to discuss bronchoscopy results.   Current Outpatient Medications:    ALPRAZolam (XANAX) 0.5 MG tablet, Take 0.5 mg by mouth daily as needed., Disp: , Rfl:    amLODipine (NORVASC) 10 MG tablet, Take 10 mg by mouth daily., Disp: , Rfl:    aspirin 81 MG EC tablet, Take 81 mg by mouth daily. Swallow whole., Disp: , Rfl:    buPROPion (WELLBUTRIN XL) 300 MG 24 hr tablet, Take 300 mg by mouth daily., Disp: , Rfl:    busPIRone (BUSPAR) 15 MG tablet, Take 15 mg by mouth daily., Disp: , Rfl:    cetirizine (ZYRTEC)  10 MG tablet, Take 10 mg by mouth daily as needed for allergies., Disp: , Rfl:    furosemide (LASIX) 40 MG tablet, Take 40 mg by mouth daily. , Disp: , Rfl:    hydrochlorothiazide (MICROZIDE) 12.5 MG capsule, Take 12.5 mg by mouth daily., Disp: , Rfl:    liothyronine (CYTOMEL) 25 MCG tablet, Take 25 mcg by mouth daily., Disp: , Rfl:    losartan (COZAAR) 100 MG tablet, Take 100 mg by mouth daily., Disp: , Rfl:    Multiple Vitamins-Minerals (CENTRUM SILVER 50+MEN) TABS, Take 1 tablet by mouth daily., Disp: , Rfl:    oxyCODONE-acetaminophen (PERCOCET) 10-325 MG tablet, Take 1 tablet by mouth every 4 (four) hours as needed for pain., Disp: , Rfl:    pantoprazole (PROTONIX) 40 MG tablet, Take 40 mg by mouth every evening. , Disp: , Rfl:    Probiotic Product (PROBIOTIC DAILY PO), Take 1 capsule by mouth daily., Disp: , Rfl:    prochlorperazine (COMPAZINE) 10 MG tablet, TAKE ONE TABLET EVERY 6 HOURS AS NEEDED, Disp: 30 tablet, Rfl: 2   rosuvastatin (CRESTOR) 5 MG tablet, Take 5  mg by mouth at bedtime., Disp: , Rfl:    sildenafil (VIAGRA) 100 MG tablet, Take 100 mg by mouth daily as needed for erectile dysfunction., Disp: , Rfl:   I spent 62 minutes dedicated to the care of this patient on the date of this encounter to include pre-visit review of records, face-to-face time with the patient discussing conditions above, post visit ordering of testing, clinical documentation with the electronic health record, making appropriate referrals as documented, and communicating necessary findings to members of the patients care team.   Garner Nash, DO [place] [facility] 01/20/2021 1:46 PM

## 2021-01-20 NOTE — H&P (View-Only) (Signed)
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  Also had renal cell carcinoma CT-guided ablation in July 2020.  Now has concern for recurrent non-small cell disease with a new AP window node in July 2021 followed by Dr. Earlie Server.  Started on immunotherapy with Imfinzi in October 2021 status post 13 cycles.  Now had a repeat PET scan of the chest which was in January 04, 2021 by Dr. Earlie Server.  Patient was referred for consideration of tissue biopsy for a newly defined 6.8 cm posterior left upper lobe mass concerning for malignancy with associated left perihilar lesion.  Of note his initial diagnosis for malignancy was completed at [facility] per patient's sister who was present today in the office.  OV 01/20/2021: Here today to discuss biopsy of lung mass found on recent CT imaging.   Oncology History  Non-small cell carcinoma of left lung, stage 3 ([place])  09/25/2019 Initial Diagnosis   Non-small cell carcinoma of left lung, stage 3 ([place])   09/30/2019 - 11/12/2019 Chemotherapy   The patient had palonosetron (ALOXI) injection 0.25 mg, 0.25 mg, Intravenous,  Once, 6 of 7 cycles Administration: 0.25 mg (09/30/2019), 0.25 mg (11/04/2019), 0.25 mg (11/12/2019), 0.25 mg (10/07/2019), 0.25 mg (10/14/2019), 0.25 mg (10/30/2019) CARBOplatin (PARAPLATIN) 300 mg in sodium chloride 0.9 % 250 mL chemo infusion, 300 mg (100 % of original dose 300 mg), Intravenous,  Once, 6 of 7 cycles Dose  modification: 300 mg (original dose 300 mg, Cycle 1) Administration: 300 mg (09/30/2019), 300 mg (11/04/2019), 300 mg (11/12/2019), 300 mg (10/07/2019), 300 mg (10/14/2019), 300 mg (10/30/2019) PACLitaxel (TAXOL) 120 mg in sodium chloride 0.9 % 250 mL chemo infusion (</= 80mg /m2), 45 mg/m2 = 120 mg, Intravenous,  Once, 6 of 7 cycles Administration: 120 mg (09/30/2019), 120 mg (11/04/2019), 120 mg (11/12/2019), 120 mg (10/07/2019), 120 mg (10/14/2019), 120 mg (10/30/2019)   for chemotherapy treatment.     12/18/2019 -  Chemotherapy    Patient is on Treatment Plan: LUNG NSCLC DURVALUMAB Q28D          Past Medical History:  Diagnosis Date   Aortic atherosclerosis (HCC)    Arthritis    Chronic low back pain    COPD (chronic obstructive pulmonary disease) (HCC)    Coronary artery calcification seen on CT scan    Multivessel   Essential hypertension    Gait instability    Previous logging accident   GERD (gastroesophageal reflux disease)    Lung cancer (HCC)    Poorly differentiated, XRT   Renal cell carcinoma ([place])    Left, status post cryoablation August 2020     Family History  Problem Relation Age of Onset   Heart attack Mother    Renal Disease Mother    Heart failure Father    Heart attack Father    Diabetes Sister    Heart disease Brother      Past Surgical History:  Procedure Laterality Date  CHOLECYSTECTOMY     2019   FRACTURE SURGERY     bil legs (logging trees)   IR RADIOLOGIST EVAL & MGMT  09/27/2018   IR RADIOLOGIST EVAL & MGMT  11/22/2018   IR RADIOLOGIST EVAL & MGMT  04/10/2019   IR RADIOLOGIST EVAL & MGMT  10/13/2020   NECK SURGERY  1994-1995   RADIOLOGY WITH ANESTHESIA Left 10/31/2018   Procedure: CT WITH ANESTHESIA RENAL CRYO ABLATION;  Surgeon: Aletta Edouard, MD;  Location: [facility];  Service: Radiology;  Laterality: Left;    Social History   Socioeconomic History   Marital status: Single    Spouse name: Not on file   Number of children: Not on file   Years of  education: Not on file   Highest education level: Not on file  Occupational History   Not on file  Tobacco Use   Smoking status: Every Day    Packs/day: 0.50    Types: Cigarettes   Smokeless tobacco: Never   Tobacco comments:    10 a day  Vaping Use   Vaping Use: Never used  Substance and Sexual Activity   Alcohol use: Never    Comment: BEER ONCE IN A WHILE    Drug use: Never   Sexual activity: Not Currently  Other Topics Concern   Not on file  Social History Narrative   10-23-17 Unable to ask abuse questions wife and other family with him today.   Social Determinants of Health   Financial Resource Strain: Not on file  Food Insecurity: Not on file  Transportation Needs: Not on file  Physical Activity: Not on file  Stress: Not on file  Social Connections: Not on file  Intimate Partner Violence: Not on file     No Known Allergies   Outpatient Medications Prior to Visit  Medication Sig Dispense Refill   ALPRAZolam (XANAX) 0.5 MG tablet Take 0.5 mg by mouth daily as needed.     amLODipine (NORVASC) 10 MG tablet Take 10 mg by mouth daily.     aspirin 81 MG EC tablet Take 81 mg by mouth daily. Swallow whole.     buPROPion (WELLBUTRIN XL) 300 MG 24 hr tablet Take 300 mg by mouth daily.     busPIRone (BUSPAR) 15 MG tablet Take 15 mg by mouth daily.     cetirizine (ZYRTEC) 10 MG tablet Take 10 mg by mouth daily as needed for allergies.     furosemide (LASIX) 40 MG tablet Take 40 mg by mouth daily.      hydrochlorothiazide (MICROZIDE) 12.5 MG capsule Take 12.5 mg by mouth daily.     liothyronine (CYTOMEL) 25 MCG tablet Take 25 mcg by mouth daily.     losartan (COZAAR) 100 MG tablet Take 100 mg by mouth daily.     Multiple Vitamins-Minerals (CENTRUM SILVER 50+MEN) TABS Take 1 tablet by mouth daily.     oxyCODONE-acetaminophen (PERCOCET) 10-325 MG tablet Take 1 tablet by mouth every 4 (four) hours as needed for pain.     pantoprazole (PROTONIX) 40 MG tablet Take 40 mg by mouth  every evening.      Probiotic Product (PROBIOTIC DAILY PO) Take 1 capsule by mouth daily.     prochlorperazine (COMPAZINE) 10 MG tablet TAKE ONE TABLET EVERY 6 HOURS AS NEEDED 30 tablet 2   rosuvastatin (CRESTOR) 5 MG tablet Take 5 mg by mouth at bedtime.     sildenafil (VIAGRA) 100 MG tablet Take 100 mg by mouth daily as needed for  erectile dysfunction.     No facility-administered medications prior to visit.    Review of Systems  Constitutional:  Negative for chills, fever, malaise/fatigue and weight loss.  HENT:  Negative for hearing loss, sore throat and tinnitus.   Eyes:  Negative for blurred vision and double vision.  Respiratory:  Positive for shortness of breath. Negative for cough, hemoptysis, sputum production, wheezing and stridor.   Cardiovascular:  Negative for chest pain, palpitations, orthopnea, leg swelling and PND.  Gastrointestinal:  Negative for abdominal pain, constipation, diarrhea, heartburn, nausea and vomiting.  Genitourinary:  Negative for dysuria, hematuria and urgency.  Musculoskeletal:  Negative for joint pain and myalgias.  Skin:  Negative for itching and rash.  Neurological:  Negative for dizziness, tingling, weakness and headaches.  Endo/Heme/Allergies:  Negative for environmental allergies. Does not bruise/bleed easily.  Psychiatric/Behavioral:  Negative for depression. The patient is not nervous/anxious and does not have insomnia.   All other systems reviewed and are negative.   Objective:  Physical Exam Vitals reviewed.  Constitutional:      General: He is not in acute distress.    Appearance: He is well-developed. He is obese.  HENT:     Head: Normocephalic and atraumatic.  Eyes:     General: No scleral icterus.    Conjunctiva/sclera: Conjunctivae normal.     Pupils: Pupils are equal, round, and reactive to light.  Neck:     Vascular: No JVD.     Trachea: No tracheal deviation.  Cardiovascular:     Rate and Rhythm: Normal rate and regular  rhythm.     Heart sounds: Normal heart sounds. No murmur heard. Pulmonary:     Effort: Pulmonary effort is normal. No tachypnea, accessory muscle usage or respiratory distress.     Breath sounds: No stridor. No wheezing, rhonchi or rales.     Comments: Diminished breath sounds bilaterally, no crackles Abdominal:     General: Bowel sounds are normal. There is no distension.     Palpations: Abdomen is soft.     Tenderness: There is no abdominal tenderness.  Musculoskeletal:        General: No tenderness.     Cervical back: Neck supple.  Lymphadenopathy:     Cervical: No cervical adenopathy.  Skin:    General: Skin is warm and dry.     Capillary Refill: Capillary refill takes less than 2 seconds.     Findings: No rash.  Neurological:     Mental Status: He is alert and oriented to person, place, and time.  Psychiatric:        Behavior: Behavior normal.     Vitals:   01/20/21 1340  BP: (!) 142/70  Pulse: 81  Temp: 97.8 F (36.6 C)  TempSrc: Oral  SpO2: 98%  Weight: (!) 350 lb (158.8 kg)  Height: 6\' 4"  (1.93 m)   98% on RA BMI Readings from Last 3 Encounters:  01/20/21 42.60 kg/m  01/07/21 45.39 kg/m  12/15/20 45.39 kg/m   Wt Readings from Last 3 Encounters:  01/20/21 (!) 350 lb (158.8 kg)  05/06/20 (!) 353 lb 8 oz (160.3 kg)  12/10/19 (!) 346 lb (156.9 kg)     CBC    Component Value Date/Time   WBC 6.0 12/14/2020 0811   WBC 12.3 (H) 11/01/2018 0321   RBC 4.79 12/14/2020 0811   HGB 13.0 12/14/2020 0811   HCT 39.7 12/14/2020 0811   PLT 245 12/14/2020 0811   MCV 82.9 12/14/2020 0811   MCH 27.1  12/14/2020 0811   MCHC 32.7 12/14/2020 0811   RDW 18.4 (H) 12/14/2020 0811   LYMPHSABS 1.5 12/14/2020 0811   MONOABS 0.6 12/14/2020 0811   EOSABS 0.2 12/14/2020 0811   BASOSABS 0.1 12/14/2020 0811     Chest Imaging: October CT chest: Enlarging mass in the left upper lobe associated fibrotic changes status post radiation therapy. The patient's images have been  independently reviewed by me.    January 04, 2021 nuclear medicine PET scan: 6.8 cm posterior left upper lobe mass concerning for primary malignancy extending into the left hilum.  Possible nodal metastasis.  Overall imaging is concerning for potential recurrence of disease. The patient's images have been independently reviewed by me.    Pulmonary Functions Testing Results: PFT Results Latest Ref Rng & Units 08/03/2017  FVC-Pre L 4.62  FVC-Predicted Pre % 87  FVC-Post L 4.42  FVC-Predicted Post % 83  Pre FEV1/FVC % % 73  Post FEV1/FCV % % 76  FEV1-Pre L 3.35  FEV1-Predicted Pre % 84  FEV1-Post L 3.38  DLCO uncorrected ml/min/mmHg 32.50  DLCO UNC% % 86  DLVA Predicted % 86  TLC L 8.61  TLC % Predicted % 110  RV % Predicted % 154    FeNO:   Pathology:  Please see oncology history as stated above in HPI with history of lung cancer.  Echocardiogram:   Heart Catheterization:     Assessment & Plan:     ICD-10-CM   1. Lung mass  R91.8 Ambulatory referral to Pulmonology    Procedural/ Surgical Case Request: VIDEO BRONCHOSCOPY WITH ENDOBRONCHIAL NAVIGATION, VIDEO BRONCHOSCOPY WITH ENDOBRONCHIAL ULTRASOUND    2. Non-small cell carcinoma of left lung, stage 3 (HCC)  C34.92     3. Malignant neoplasm of upper lobe of left lung (HCC)  C34.12     4. Abnormal PET of left lung  R94.2       Discussion: This is a 70 year old gentleman a history of stage I lung cancer status post SBRT, followed by additional stage III non-small cell lung cancer treated with chemo and radiation and immune therapy.  Subsequent CT imaging shows enlarging left mass, PET scan with hypermetabolic uptake concerning for either a new malignancy or recurrence of disease.  Patient was referred for medical oncology for repeat biopsy of enlarging lung mass.  Plan: Today in the office we discussed risk benefits alternatives of proceeding with bronchoscopy. We discussed the utility of tissue sampling with the  setting of possible recurrence of disease. We discussed the risk of bleeding and pneumothorax. Patient is not on any blood thinners or anticoagulants. He is agreeable to proceed. We will try to get this done as soon as possible. Tentatively scheduled for 01/27/2021. We appreciate endoscopy staff willing to work with [place] on getting additional patients in.  Return to clinic in approximately 1 week after case to see me or APP to discuss bronchoscopy results.   Current Outpatient Medications:    ALPRAZolam (XANAX) 0.5 MG tablet, Take 0.5 mg by mouth daily as needed., Disp: , Rfl:    amLODipine (NORVASC) 10 MG tablet, Take 10 mg by mouth daily., Disp: , Rfl:    aspirin 81 MG EC tablet, Take 81 mg by mouth daily. Swallow whole., Disp: , Rfl:    buPROPion (WELLBUTRIN XL) 300 MG 24 hr tablet, Take 300 mg by mouth daily., Disp: , Rfl:    busPIRone (BUSPAR) 15 MG tablet, Take 15 mg by mouth daily., Disp: , Rfl:    cetirizine (ZYRTEC)  10 MG tablet, Take 10 mg by mouth daily as needed for allergies., Disp: , Rfl:    furosemide (LASIX) 40 MG tablet, Take 40 mg by mouth daily. , Disp: , Rfl:    hydrochlorothiazide (MICROZIDE) 12.5 MG capsule, Take 12.5 mg by mouth daily., Disp: , Rfl:    liothyronine (CYTOMEL) 25 MCG tablet, Take 25 mcg by mouth daily., Disp: , Rfl:    losartan (COZAAR) 100 MG tablet, Take 100 mg by mouth daily., Disp: , Rfl:    Multiple Vitamins-Minerals (CENTRUM SILVER 50+MEN) TABS, Take 1 tablet by mouth daily., Disp: , Rfl:    oxyCODONE-acetaminophen (PERCOCET) 10-325 MG tablet, Take 1 tablet by mouth every 4 (four) hours as needed for pain., Disp: , Rfl:    pantoprazole (PROTONIX) 40 MG tablet, Take 40 mg by mouth every evening. , Disp: , Rfl:    Probiotic Product (PROBIOTIC DAILY PO), Take 1 capsule by mouth daily., Disp: , Rfl:    prochlorperazine (COMPAZINE) 10 MG tablet, TAKE ONE TABLET EVERY 6 HOURS AS NEEDED, Disp: 30 tablet, Rfl: 2   rosuvastatin (CRESTOR) 5 MG tablet, Take 5  mg by mouth at bedtime., Disp: , Rfl:    sildenafil (VIAGRA) 100 MG tablet, Take 100 mg by mouth daily as needed for erectile dysfunction., Disp: , Rfl:   I spent 62 minutes dedicated to the care of this patient on the date of this encounter to include pre-visit review of records, face-to-face time with the patient discussing conditions above, post visit ordering of testing, clinical documentation with the electronic health record, making appropriate referrals as documented, and communicating necessary findings to members of the patients care team.   Garner Nash, DO [facility] 01/20/2021 1:46 PM

## 2021-01-20 NOTE — Patient Instructions (Signed)
Thank you for visiting Dr. Valeta Harms at [facility]. Today we recommend the following:  Orders Placed This Encounter  Procedures   Procedural/ Surgical Case Request: VIDEO BRONCHOSCOPY WITH ENDOBRONCHIAL NAVIGATION, VIDEO BRONCHOSCOPY WITH ENDOBRONCHIAL ULTRASOUND   Ambulatory referral to Pulmonology   Tentative bronchoscopy date on 01/27/2021  Return in about 2 weeks (around 02/03/2021) for with APP. Or me to review bronch results     Please do your part to reduce the spread of COVID-19.

## 2021-01-22 DIAGNOSIS — I10 Essential (primary) hypertension: Secondary | ICD-10-CM | POA: Diagnosis not present

## 2021-01-22 DIAGNOSIS — M5137 Other intervertebral disc degeneration, lumbosacral region: Secondary | ICD-10-CM | POA: Diagnosis not present

## 2021-01-22 DIAGNOSIS — M5020 Other cervical disc displacement, unspecified cervical region: Secondary | ICD-10-CM | POA: Diagnosis not present

## 2021-01-25 ENCOUNTER — Other Ambulatory Visit: Payer: Self-pay

## 2021-01-25 ENCOUNTER — Ambulatory Visit: Payer: Medicare Other | Admitting: Internal Medicine

## 2021-01-25 ENCOUNTER — Other Ambulatory Visit: Payer: Self-pay | Admitting: Pulmonary Disease

## 2021-01-25 ENCOUNTER — Encounter (HOSPITAL_COMMUNITY): Payer: Self-pay | Admitting: Pulmonary Disease

## 2021-01-25 LAB — SARS CORONAVIRUS 2 (TAT 6-24 HRS): SARS Coronavirus 2: NEGATIVE

## 2021-01-25 NOTE — Progress Notes (Signed)
Spoke with pt's sister, [name] for pre-op call. DPR on file. Pt has hx of Multivessel distribution coronary artery calcification by CTA. Has seen Dr. Domenic Polite in March of 2021. Was told to follow up in 6 months and has not done so. Chart sent to Anesthesia PA.  She states pt is not diabetic.   Covid test done today, 01/25/21. Sister understands that pt needs to wear a mask when he is out in public or around people he doesn't normally live with.

## 2021-01-26 NOTE — Anesthesia Preprocedure Evaluation (Addendum)
Anesthesia Evaluation  Patient identified by MRN, date of birth, ID band Patient awake    Reviewed: Allergy & Precautions, NPO status , Patient's Chart, lab work & pertinent test results  Airway Mallampati: III  TM Distance: >3 FB Neck ROM: Full    Dental  (+) Poor Dentition, Chipped, Missing, Dental Advisory Given,    Pulmonary COPD, Current SmokerPatient did not abstain from smoking.,    Pulmonary exam normal breath sounds clear to auscultation       Cardiovascular hypertension, Pt. on medications + CAD  Normal cardiovascular exam Rhythm:Regular Rate:Normal  TTE 2021 1. Left ventricular ejection fraction, by estimation, is 65 to 70%. The  left ventricle has hyperdynamic function. Left ventricular endocardial  border not optimally defined to evaluate regional wall motion. Left  ventricular diastolic parameters were  normal.  2. Right ventricular systolic function is normal. The right ventricular  size is normal. There is normal pulmonary artery systolic pressure.  3. Left atrial size was mildly dilated.  4. The mitral valve is normal in structure. No evidence of mitral valve  regurgitation. No evidence of mitral stenosis.  5. The aortic valve has an indeterminant number of cusps. Aortic valve  regurgitation is not visualized. No aortic stenosis is present.  6. The inferior vena cava is normal in size with greater than 50%  respiratory variability, suggesting right atrial pressure of 3 mmHg.    Neuro/Psych PSYCHIATRIC DISORDERS Anxiety Depression negative neurological ROS     GI/Hepatic Neg liver ROS, GERD  ,  Endo/Other  Morbid obesity (BMI 45)  Renal/GU negative Renal ROSH/o RCC  negative genitourinary   Musculoskeletal  (+) Arthritis ,   Abdominal   Peds  Hematology negative hematology ROS (+)   Anesthesia Other Findings   Reproductive/Obstetrics                            Anesthesia Physical Anesthesia Plan  ASA: 3  Anesthesia Plan: General   Post-op Pain Management: Tylenol PO (pre-op)   Induction: Intravenous  PONV Risk Score and Plan: 1 and Midazolam, Dexamethasone and Ondansetron  Airway Management Planned: Oral ETT  Additional Equipment:   Intra-op Plan:   Post-operative Plan: Extubation in OR  Informed Consent: I have reviewed the patients History and Physical, chart, labs and discussed the procedure including the risks, benefits and alternatives for the proposed anesthesia with the patient or authorized representative who has indicated his/her understanding and acceptance.     Dental advisory given  Plan Discussed with: CRNA  Anesthesia Plan Comments: (PAT note written 01/26/2021 by Myra Gianotti, PA-C. )      Anesthesia Quick Evaluation

## 2021-01-26 NOTE — Progress Notes (Signed)
Anesthesia Chart Review: SAME DAY WORK-UP  Case: [id number] Date/Time: 01/27/21 1300   Procedures:      ROBOTIC ASSISTED NAVIGATIONAL BRONCHOSCOPY (Left) - ION w/ CIOS     VIDEO BRONCHOSCOPY WITH ENDOBRONCHIAL ULTRASOUND (Bilateral)   Anesthesia type: General   Diagnosis: Lung mass [R91.8]   Pre-op diagnosis: Lung mass   Location: [facility] ROOM 1 / [facility]   Surgeons: Garner Nash, DO       DISCUSSION: Patient is a 70 year old male scheduled for the above procedure. History of [place] lung cancer LUL/hilar region s/p SRBT in 2019-2020, new adenopathy inaccessible to biopsy 09/2019, s/p chemotherapy/immunotherapy in 2021 with new LUL posterior rmass on recent imaging. Also received cryoablation for left RCC in 2020. Tissue diagnosis of left lung mass recommended.   History includes includes smoking, COPD, non-small cell lung cancer (LUL s/p SBRT June-July 2019; s/p SBRT left suprahilar region April-May 2020; chemotherapy/immunotherapy 2021), renal cell carcinoma (left RCC diagnosed 10/31/18, s/p cryoablation), HTN, coronary/aortic atherosclerosis, GERD, chronic low back pain, gait instability (post logging accident 1980's; uses a wheelchair), spinal surgery, cholecystectomy (06/16/17), obesity.    01/25/21 presurgical COVID-19 test negative. He is a same day work-up, so labs and EKG on arrival as indicated. Anesthesia team to evaluated on the day of surgery.    VS:  BP Readings from Last 3 Encounters:  01/20/21 (!) 142/70  01/07/21 (!) 147/47  12/15/20 (!) 168/71   Pulse Readings from Last 3 Encounters:  01/20/21 81  01/07/21 72  12/15/20 70     PROVIDERS: [name], MD is PCP  Rozann Lesches, MD is cardiologist. One visit on 05/29/19. Evaluation ordered per PCP due to evidence of coronary calcifications on chest imaging. He was asymptomatic, although activity limited due to prior back injury. He was already on "good medical therapy by Dr. Quillian Quince". Echo ordered which showed  normal LVF. No ischemic testing recommended. Six month follow-up planned, but did not happen.  Curt Bears, MD is HEM-ONC  Kyung Rudd, MD is RAD-ONC  [name], MD is urologist  Aletta Edouard, MD is IR   LABS: Labs on day of procedure as indicated. As of 12/14/20, glucose 013, Cr 1.11, AST 17, ALT 14, WBC 6.0, H/H 13.0/39.7, PLT 245.    IMAGES: PET Scan 01/04/21:  IMPRESSION: - 6.8 cm posterior left upper lobe mass, compatible with primary bronchogenic neoplasm, extending to the left perihilar region. - Overlying involvement of the left posterolateral 3rd and 4th ribs with associated pathologic fracture. - Mild mediastinal nodal metastases, as above. [See full report]   CT Chest 12/17/20: IMPRESSION: 1. Interval development of a 6.3 x 3.8 x 2.6 cm mass in the periphery of the left upper lobe, highly concerning for recurrent neoplasm. Further evaluation with PET-CT is strongly recommended in the near future to better evaluate this finding. 2. Aortic atherosclerosis, in addition to left main and 3 vessel coronary artery disease. 3. Diffuse bronchial wall thickening with mild centrilobular and paraseptal emphysema; imaging findings suggestive of underlying COPD. 4. Additional incidental findings, similar to prior studies, as above. [See full report]  CT abd 12/17/20: IMPRESSION: 1. Post LEFT renal cryoablation with expected rim of post treatment change. No evidence of disease recurrence. 2. Cysts some with hemorrhagic or proteinaceous features in the RIGHT kidney, no sign of suspicious enhancing lesion. 3. Reflux into hepatic veins during arterial phase, may relate to Valsalva or contrast rate injection can sometimes be seen in the setting of cardiac dysfunction however. Correlate  with any history of cardiac dysfunction. Visualized heart is normal size. 4. Lobular hepatic contours with mild fissural widening, correlate with any clinical or laboratory  evidence of liver disease. 5. Aortic atherosclerosis.    EKG: Last EKG currently available is > 15 year old. NSR on 05/29/19.    CV: Echo 06/20/19: - Sonographer Comments: Technically difficult study due to poor echo windows  and patient is morbidly obese. Image acquisition challenging due to  patient body habitus.  IMPRESSIONS   1. Left ventricular ejection fraction, by estimation, is 65 to 70%. The  left ventricle has hyperdynamic function. Left ventricular endocardial  border not optimally defined to evaluate regional wall motion. Left  ventricular diastolic parameters were  normal.   2. Right ventricular systolic function is normal. The right ventricular  size is normal. There is normal pulmonary artery systolic pressure.   3. Left atrial size was mildly dilated.   4. The mitral valve is normal in structure. No evidence of mitral valve  regurgitation. No evidence of mitral stenosis.   5. The aortic valve has an indeterminant number of cusps. Aortic valve  regurgitation is not visualized. No aortic stenosis is present.   6. The inferior vena cava is normal in size with greater than 50%  respiratory variability, suggesting right atrial pressure of 3 mmHg.  - Comparison(s): Echocardi[facility] done [facility]/15/17 at [facility], showed an EF of 65%.    Past Medical History:  Diagnosis Date   Anxiety    Aortic atherosclerosis (HCC)    Arthritis    Chronic low back pain    COPD (chronic obstructive pulmonary disease) (HCC)    Coronary artery calcification seen on CT scan    Multivessel   Depression    Essential hypertension    Gait instability    Previous logging accident   GERD (gastroesophageal reflux disease)    Lung cancer ([place])    Poorly differentiated, XRT   Renal cell carcinoma (HCC)    Left, status post cryoablation August 2020    Past Surgical History:  Procedure Laterality Date   CHOLECYSTECTOMY     2019   FRACTURE SURGERY     bil legs (logging trees)   IR RADIOLOGIST EVAL  & MGMT  09/27/2018   IR RADIOLOGIST EVAL & MGMT  11/22/2018   IR RADIOLOGIST EVAL & MGMT  04/10/2019   IR RADIOLOGIST EVAL & MGMT  10/13/2020   NECK SURGERY  1994-1995   RADIOLOGY WITH ANESTHESIA Left 10/31/2018   Procedure: CT WITH ANESTHESIA RENAL CRYO ABLATION;  Surgeon: Aletta Edouard, MD;  Location: [facility] ORS;  Service: Radiology;  Laterality: Left;    MEDICATIONS: No current facility-administered medications for this encounter.    ALPRAZolam (XANAX) 0.5 MG tablet   amLODipine (NORVASC) 10 MG tablet   aspirin 81 MG EC tablet   buPROPion (WELLBUTRIN XL) 300 MG 24 hr tablet   busPIRone (BUSPAR) 15 MG tablet   cetirizine (ZYRTEC) 10 MG tablet   furosemide (LASIX) 40 MG tablet   hydrochlorothiazide (MICROZIDE) 12.5 MG capsule   liothyronine (CYTOMEL) 25 MCG tablet   losartan (COZAAR) 100 MG tablet   Multiple Vitamins-Minerals (CENTRUM SILVER 50+MEN) TABS   oxyCODONE-acetaminophen (PERCOCET) 10-325 MG tablet   pantoprazole (PROTONIX) 40 MG tablet   Probiotic Product (PROBIOTIC DAILY PO)   prochlorperazine (COMPAZINE) 10 MG tablet   rosuvastatin (CRESTOR) 5 MG tablet    Myra Gianotti, PA-C Surgical Short Stay/Anesthesiology [facility] Phone [phone] [facility] Phone [phone] 01/26/2021 10:05 AM

## 2021-01-27 ENCOUNTER — Ambulatory Visit (HOSPITAL_COMMUNITY): Payer: Medicare Other

## 2021-01-27 ENCOUNTER — Encounter (HOSPITAL_COMMUNITY)
Admission: RE | Disposition: A | Discharge status: Home / Self Care | Payer: Self-pay | Source: Home / Self Care | Attending: Pulmonary Disease

## 2021-01-27 ENCOUNTER — Ambulatory Visit (HOSPITAL_COMMUNITY): Payer: Medicare Other | Admitting: Vascular Surgery

## 2021-01-27 ENCOUNTER — Ambulatory Visit (HOSPITAL_COMMUNITY)
Admission: RE | Admit: 2021-01-27 | Discharge: 2021-01-27 | Disposition: A | Discharge status: Home / Self Care | Payer: Medicare Other | Attending: Pulmonary Disease | Admitting: Pulmonary Disease

## 2021-01-27 DIAGNOSIS — F32A Depression, unspecified: Secondary | ICD-10-CM | POA: Diagnosis not present

## 2021-01-27 DIAGNOSIS — Z85528 Personal history of other malignant neoplasm of kidney: Secondary | ICD-10-CM | POA: Diagnosis not present

## 2021-01-27 DIAGNOSIS — C3411 Malignant neoplasm of upper lobe, right bronchus or lung: Secondary | ICD-10-CM | POA: Insufficient documentation

## 2021-01-27 DIAGNOSIS — C3412 Malignant neoplasm of upper lobe, left bronchus or lung: Secondary | ICD-10-CM | POA: Diagnosis not present

## 2021-01-27 DIAGNOSIS — Z6841 Body Mass Index (BMI) 40.0 and over, adult: Secondary | ICD-10-CM | POA: Insufficient documentation

## 2021-01-27 DIAGNOSIS — R911 Solitary pulmonary nodule: Secondary | ICD-10-CM | POA: Diagnosis present

## 2021-01-27 DIAGNOSIS — I700 Atherosclerosis of aorta: Secondary | ICD-10-CM | POA: Diagnosis not present

## 2021-01-27 DIAGNOSIS — C801 Malignant (primary) neoplasm, unspecified: Secondary | ICD-10-CM | POA: Diagnosis not present

## 2021-01-27 DIAGNOSIS — I2510 Atherosclerotic heart disease of native coronary artery without angina pectoris: Secondary | ICD-10-CM | POA: Insufficient documentation

## 2021-01-27 DIAGNOSIS — Z9221 Personal history of antineoplastic chemotherapy: Secondary | ICD-10-CM | POA: Diagnosis not present

## 2021-01-27 DIAGNOSIS — Z9889 Other specified postprocedural states: Secondary | ICD-10-CM

## 2021-01-27 DIAGNOSIS — Z419 Encounter for procedure for purposes other than remedying health state, unspecified: Secondary | ICD-10-CM

## 2021-01-27 DIAGNOSIS — Z85118 Personal history of other malignant neoplasm of bronchus and lung: Secondary | ICD-10-CM | POA: Diagnosis not present

## 2021-01-27 DIAGNOSIS — M1990 Unspecified osteoarthritis, unspecified site: Secondary | ICD-10-CM | POA: Diagnosis not present

## 2021-01-27 DIAGNOSIS — F419 Anxiety disorder, unspecified: Secondary | ICD-10-CM | POA: Diagnosis not present

## 2021-01-27 DIAGNOSIS — K219 Gastro-esophageal reflux disease without esophagitis: Secondary | ICD-10-CM | POA: Diagnosis not present

## 2021-01-27 DIAGNOSIS — J984 Other disorders of lung: Secondary | ICD-10-CM | POA: Diagnosis not present

## 2021-01-27 DIAGNOSIS — C3490 Malignant neoplasm of unspecified part of unspecified bronchus or lung: Secondary | ICD-10-CM | POA: Diagnosis not present

## 2021-01-27 DIAGNOSIS — C771 Secondary and unspecified malignant neoplasm of intrathoracic lymph nodes: Secondary | ICD-10-CM | POA: Insufficient documentation

## 2021-01-27 DIAGNOSIS — I119 Hypertensive heart disease without heart failure: Secondary | ICD-10-CM | POA: Insufficient documentation

## 2021-01-27 DIAGNOSIS — R918 Other nonspecific abnormal finding of lung field: Secondary | ICD-10-CM | POA: Diagnosis present

## 2021-01-27 DIAGNOSIS — F17210 Nicotine dependence, cigarettes, uncomplicated: Secondary | ICD-10-CM | POA: Insufficient documentation

## 2021-01-27 DIAGNOSIS — I10 Essential (primary) hypertension: Secondary | ICD-10-CM | POA: Diagnosis not present

## 2021-01-27 DIAGNOSIS — J449 Chronic obstructive pulmonary disease, unspecified: Secondary | ICD-10-CM | POA: Insufficient documentation

## 2021-01-27 HISTORY — PX: VIDEO BRONCHOSCOPY WITH RADIAL ENDOBRONCHIAL ULTRASOUND: SHX6849

## 2021-01-27 HISTORY — PX: BRONCHIAL BRUSHINGS: SHX5108

## 2021-01-27 HISTORY — PX: BRONCHIAL BIOPSY: SHX5109

## 2021-01-27 HISTORY — DX: Depression, unspecified: F32.A

## 2021-01-27 HISTORY — PX: BRONCHIAL NEEDLE ASPIRATION BIOPSY: SHX5106

## 2021-01-27 HISTORY — DX: Anxiety disorder, unspecified: F41.9

## 2021-01-27 HISTORY — PX: VIDEO BRONCHOSCOPY WITH ENDOBRONCHIAL ULTRASOUND: SHX6177

## 2021-01-27 LAB — CBC
HCT: 42.3 % (ref 39.0–52.0)
Hemoglobin: 13.7 g/dL (ref 13.0–17.0)
MCH: 27.7 pg (ref 26.0–34.0)
MCHC: 32.4 g/dL (ref 30.0–36.0)
MCV: 85.6 fL (ref 80.0–100.0)
Platelets: 274 10*3/uL (ref 150–400)
RBC: 4.94 MIL/uL (ref 4.22–5.81)
RDW: 18 % — ABNORMAL HIGH (ref 11.5–15.5)
WBC: 7.5 10*3/uL (ref 4.0–10.5)
nRBC: 0 % (ref 0.0–0.2)

## 2021-01-27 LAB — BASIC METABOLIC PANEL
Anion gap: 8 (ref 5–15)
BUN: 10 mg/dL (ref 8–23)
CO2: 24 mmol/L (ref 22–32)
Calcium: 8.8 mg/dL — ABNORMAL LOW (ref 8.9–10.3)
Chloride: 101 mmol/L (ref 98–111)
Creatinine, Ser: 0.88 mg/dL (ref 0.61–1.24)
GFR, Estimated: >60 mL/min (ref >60)
Glucose, Bld: 102 mg/dL — ABNORMAL HIGH (ref 70–99)
Potassium: 3.9 mmol/L (ref 3.5–5.1)
Sodium: 133 mmol/L — ABNORMAL LOW (ref 135–145)

## 2021-01-27 SURGERY — BRONCHOSCOPY, WITH BIOPSY USING ELECTROMAGNETIC NAVIGATION
Anesthesia: General | Laterality: Left

## 2021-01-27 MED ORDER — DEXAMETHASONE SODIUM PHOSPHATE 10 MG/ML IJ SOLN
INTRAMUSCULAR | Status: DC | PRN
  Administered 2021-01-27: 10 mg via INTRAVENOUS

## 2021-01-27 MED ORDER — LACTATED RINGERS IV SOLN: INTRAVENOUS | Status: DC

## 2021-01-27 MED ORDER — ACETAMINOPHEN 500 MG PO TABS: 1000.0000 mg | ORAL_TABLET | Freq: Once | ORAL | Status: DC

## 2021-01-27 MED ORDER — LIDOCAINE 2% (20 MG/ML) 5 ML SYRINGE
INTRAMUSCULAR | Status: DC | PRN
  Administered 2021-01-27: 100 mg via INTRAVENOUS

## 2021-01-27 MED ORDER — PROPOFOL 10 MG/ML IV BOLUS
INTRAVENOUS | Status: DC | PRN
  Administered 2021-01-27: 150 mg via INTRAVENOUS

## 2021-01-27 MED ORDER — ONDANSETRON HCL 4 MG/2ML IJ SOLN
INTRAMUSCULAR | Status: DC | PRN
  Administered 2021-01-27: 4 mg via INTRAVENOUS

## 2021-01-27 MED ORDER — PHENYLEPHRINE 40 MCG/ML (10ML) SYRINGE FOR IV PUSH (FOR BLOOD PRESSURE SUPPORT)
PREFILLED_SYRINGE | INTRAVENOUS | Status: DC | PRN
  Administered 2021-01-27: 120 ug via INTRAVENOUS

## 2021-01-27 MED ORDER — FENTANYL CITRATE (PF) 250 MCG/5ML IJ SOLN
INTRAMUSCULAR | Status: DC | PRN
  Administered 2021-01-27 (×2): 50 ug via INTRAVENOUS

## 2021-01-27 MED ORDER — ROCURONIUM BROMIDE 10 MG/ML (PF) SYRINGE
PREFILLED_SYRINGE | INTRAVENOUS | Status: DC | PRN
  Administered 2021-01-27: 20 mg via INTRAVENOUS
  Administered 2021-01-27: 100 mg via INTRAVENOUS

## 2021-01-27 MED ORDER — SUGAMMADEX SODIUM 200 MG/2ML IV SOLN
INTRAVENOUS | Status: DC | PRN
  Administered 2021-01-27: 400 mg via INTRAVENOUS

## 2021-01-27 MED ORDER — CHLORHEXIDINE GLUCONATE 0.12 % MT SOLN
OROMUCOSAL | Status: AC
  Filled 2021-01-27: qty 15

## 2021-01-27 SURGICAL SUPPLY — 30 items
BRUSH CYTOL CELLEBRITY 1.5X140 (MISCELLANEOUS) IMPLANT
CANISTER SUCT 3000ML PPV (MISCELLANEOUS) ×5 IMPLANT
CONT SPEC 4OZ CLIKSEAL STRL BL (MISCELLANEOUS) ×5 IMPLANT
COVER BACK TABLE 60X90IN (DRAPES) ×5 IMPLANT
COVER DOME SNAP 22 D (MISCELLANEOUS) ×5 IMPLANT
FORCEPS BIOP RJ4 1.8 (CUTTING FORCEPS) IMPLANT
GAUZE SPONGE 4X4 12PLY STRL (GAUZE/BANDAGES/DRESSINGS) ×5 IMPLANT
GLOVE BIO SURGEON STRL SZ7.5 (GLOVE) ×5 IMPLANT
GOWN STRL REUS W/ TWL LRG LVL3 (GOWN DISPOSABLE) ×3 IMPLANT
GOWN STRL REUS W/TWL LRG LVL3 (GOWN DISPOSABLE) ×5
KIT CLEAN ENDO COMPLIANCE (KITS) ×10 IMPLANT
KIT TURNOVER KIT B (KITS) ×5 IMPLANT
MARKER SKIN DUAL TIP RULER LAB (MISCELLANEOUS) ×5 IMPLANT
NEEDLE EBUS SONO TIP PENTAX (NEEDLE) ×5 IMPLANT
NS IRRIG 1000ML POUR BTL (IV SOLUTION) ×5 IMPLANT
OIL SILICONE PENTAX (PARTS (SERVICE/REPAIRS)) ×5 IMPLANT
PAD ARMBOARD 7.5X6 YLW CONV (MISCELLANEOUS) ×10 IMPLANT
SOL ANTI FOG 6CC (MISCELLANEOUS) ×3 IMPLANT
SOLUTION ANTI FOG 6CC (MISCELLANEOUS) ×2
SYR 20CC LL (SYRINGE) ×10 IMPLANT
SYR 20ML ECCENTRIC (SYRINGE) ×10 IMPLANT
SYR 50ML SLIP (SYRINGE) IMPLANT
SYR 5ML LUER SLIP (SYRINGE) ×5 IMPLANT
TOWEL OR 17X24 6PK STRL BLUE (TOWEL DISPOSABLE) ×5 IMPLANT
TRAP SPECIMEN MUCOUS 40CC (MISCELLANEOUS) IMPLANT
TUBE CONNECTING 20'X1/4 (TUBING) ×2
TUBE CONNECTING 20X1/4 (TUBING) ×8 IMPLANT
UNDERPAD 30X30 (UNDERPADS AND DIAPERS) ×5 IMPLANT
VALVE DISPOSABLE (MISCELLANEOUS) ×5 IMPLANT
WATER STERILE IRR 1000ML POUR (IV SOLUTION) ×5 IMPLANT

## 2021-01-27 NOTE — Transfer of Care (Signed)
Immediate Anesthesia Transfer of Care Note  Patient: Reginald Berry  Procedure(s) Performed: ROBOTIC ASSISTED NAVIGATIONAL BRONCHOSCOPY (Left) VIDEO BRONCHOSCOPY WITH ENDOBRONCHIAL ULTRASOUND (Bilateral) VIDEO BRONCHOSCOPY WITH RADIAL ENDOBRONCHIAL ULTRASOUND BRONCHIAL NEEDLE ASPIRATION BIOPSIES BRONCHIAL BIOPSIES BRONCHIAL BRUSHINGS  Patient Location: Endoscopy Unit  Anesthesia Type:General  Level of Consciousness: awake, alert , oriented and patient cooperative  Airway & Oxygen Therapy: Patient Spontanous Breathing and Patient connected to face mask oxygen  Post-op Assessment: Report given to RN, Post -op Vital signs reviewed and stable and Patient moving all extremities  Post vital signs: Reviewed and stable  Last Vitals:  Vitals Value Taken Time  BP 166/78 01/27/21 1456  Temp 36.3 C 01/27/21 1456  Pulse 90 01/27/21 1459  Resp 34 01/27/21 1459  SpO2 93 % 01/27/21 1459  Vitals shown include unvalidated device data.  Last Pain:  Vitals:   01/27/21 1456  TempSrc: Temporal  PainSc:       Patients Stated Pain Goal: 2 (56/15/37 9432)  Complications: No notable events documented.

## 2021-01-27 NOTE — Anesthesia Procedure Notes (Signed)
Procedure Name: Intubation Date/Time: 01/27/2021 1:18 PM Performed by: Myna Bright, CRNA Pre-anesthesia Checklist: Patient identified, Emergency Drugs available, Suction available and Patient being monitored Patient Re-evaluated:Patient Re-evaluated prior to induction Oxygen Delivery Method: Circle system utilized Preoxygenation: Pre-oxygenation with 100% oxygen Induction Type: IV induction Ventilation: Mask ventilation without difficulty Laryngoscope Size: Mac and 4 Grade View: Grade I Tube size: 8.5 mm Number of attempts: 1 Airway Equipment and Method: Stylet Placement Confirmation: ETT inserted through vocal cords under direct vision, breath sounds checked- equal and bilateral and positive ETCO2 Secured at: 24 cm Tube secured with: Tape Dental Injury: Teeth and Oropharynx as per pre-operative assessment

## 2021-01-27 NOTE — Anesthesia Postprocedure Evaluation (Signed)
Anesthesia Post Note  Patient: Reginald Berry  Procedure(s) Performed: ROBOTIC ASSISTED NAVIGATIONAL BRONCHOSCOPY (Left) VIDEO BRONCHOSCOPY WITH ENDOBRONCHIAL ULTRASOUND (Bilateral) VIDEO BRONCHOSCOPY WITH RADIAL ENDOBRONCHIAL ULTRASOUND BRONCHIAL NEEDLE ASPIRATION BIOPSIES BRONCHIAL BIOPSIES BRONCHIAL BRUSHINGS     Patient location during evaluation: PACU Anesthesia Type: General Level of consciousness: awake and alert Pain management: pain level controlled Vital Signs Assessment: post-procedure vital signs reviewed and stable Respiratory status: spontaneous breathing, nonlabored ventilation and respiratory function stable Cardiovascular status: blood pressure returned to baseline and stable Postop Assessment: no apparent nausea or vomiting Anesthetic complications: no   No notable events documented.  Last Vitals:  Vitals:   01/27/21 1526 01/27/21 1536  BP: (!) 154/63 (!) 147/52  Pulse: 87 83  Resp: (!) 27 (!) 27  Temp:    SpO2: 99% 95%    Last Pain:  Vitals:   01/27/21 1536  TempSrc:   PainSc: 0-No pain                 Marites Nath,W. EDMOND

## 2021-01-27 NOTE — Interval H&P Note (Signed)
History and Physical Interval Note:  01/27/2021 12:58 PM  Reginald Berry  has presented today for surgery, with the diagnosis of Lung mass.  The various methods of treatment have been discussed with the patient and family. After consideration of risks, benefits and other options for treatment, the patient has consented to  Procedure(s) with comments: ROBOTIC ASSISTED NAVIGATIONAL BRONCHOSCOPY (Left) - ION w/ CIOS VIDEO BRONCHOSCOPY WITH ENDOBRONCHIAL ULTRASOUND (Bilateral) as a surgical intervention.  The patient's history has been reviewed, patient examined, no change in status, stable for surgery.  I have reviewed the patient's chart and labs.  Questions were answered to the patient's satisfaction.     [place]

## 2021-01-27 NOTE — Discharge Instructions (Signed)
Flexible Bronchoscopy, Care After This sheet gives you information about how to care for yourself after your test. Your doctor may also give you more specific instructions. If you have problems or questions, contact your doctor. Follow these instructions at home: Eating and drinking Do not eat or drink anything (not even water) for 2 hours after your test, or until your numbing medicine (local anesthetic) wears off. When your numbness is gone and your cough and gag reflexes have come back, you may: Eat only soft foods. Slowly drink liquids. The day after the test, go back to your normal diet. Driving Do not drive for 24 hours if you were given a medicine to help you relax (sedative). Do not drive or use heavy machinery while taking prescription pain medicine. General instructions  Take over-the-counter and prescription medicines only as told by your doctor. Return to your normal activities as told. Ask what activities are safe for you. Do not use any products that have nicotine or tobacco in them. This includes cigarettes and e-cigarettes. If you need help quitting, ask your doctor. Keep all follow-up visits as told by your doctor. This is important. It is very important if you had a tissue sample (biopsy) taken. Get help right away if: You have shortness of breath that gets worse. You get light-headed. You feel like you are going to pass out (faint). You have chest pain. You cough up: More than a little blood. More blood than before. Summary Do not eat or drink anything (not even water) for 2 hours after your test, or until your numbing medicine wears off. Do not use cigarettes. Do not use e-cigarettes. Get help right away if you have chest pain.  This information is not intended to replace advice given to you by your health care provider. Make sure you discuss any questions you have with your health care provider. Document Released: 12/19/2008 Document Revised: 02/03/2017 Document  Reviewed: 03/11/2016 Elsevier Patient Education  2020 [employer].

## 2021-01-27 NOTE — Op Note (Signed)
Video Bronchoscopy with Robotic Assisted Bronchoscopic Navigation  Video Bronchoscopy with Endobronchial Ultrasound Procedure Note  Date of Operation: 01/27/2021   Pre-op Diagnosis: LUL lung mass, adenopathy   Post-op Diagnosis: LUL lung mass, adenopathy   Surgeon: Garner Nash, DO   Assistants: None   Anesthesia: General endotracheal anesthesia  Operation: Flexible video fiberoptic bronchoscopy with robotic assistance and biopsies.  Estimated Blood Loss: Minimal  Complications: None  Indications and History: Reginald Berry is a 70 y.o. male with history of lung cancer, left upper lobe lung mass with adenopathy. The risks, benefits, complications, treatment options and expected outcomes were discussed with the patient.  The possibilities of pneumothorax, pneumonia, reaction to medication, pulmonary aspiration, perforation of a viscus, bleeding, failure to diagnose a condition and creating a complication requiring transfusion or operation were discussed with the patient who freely signed the consent.    Description of Procedure: The patient was seen in the Preoperative Area, was examined and was deemed appropriate to proceed.  The patient was taken to [facility] endoscopy room 3, identified as [name] and the procedure verified as Flexible Video Fiberoptic Bronchoscopy.  A Time Out was held and the above information confirmed.   Prior to the date of the procedure a high-resolution CT scan of the chest was performed. Utilizing ION software program a virtual tracheobronchial tree was generated to allow the creation of distinct navigation pathways to the patient's parenchymal abnormalities. After being taken to the operating room general anesthesia was initiated and the patient  was orally intubated. The video fiberoptic bronchoscope was introduced via the endotracheal tube and a general inspection was performed which showed normal right and left lung anatomy, aspiration of the bilateral  mainstems was completed to remove any remaining secretions. Robotic catheter inserted into patient's endotracheal tube.   Target #1 left upper lobe lung mass: The distinct navigation pathways prepared prior to this procedure were then utilized to navigate to patient's lesion identified on CT scan. The robotic catheter was secured into place and the vision probe was withdrawn.  Lesion location was approximated using fluoroscopy and radial endobronchial ultrasound for peripheral targeting. Under fluoroscopic guidance transbronchial needle brushings, transbronchial needle biopsies, and transbronchial forceps biopsies were performed to be sent for cytology and pathology.  Target #2 & #3: Left hilar 10L and subcarinal station 7: The standard scope was then withdrawn and the endobronchial ultrasound was used to identify and characterize the peritracheal, hilar and bronchial lymph nodes. Inspection showed enlarged lymph nodes within the left hilum and subcarinal space. Using real-time ultrasound guidance Wang needle biopsies were take from Station 10L and 7 nodes and were sent for cytology. The patient tolerated the procedure well without apparent complications. There was no significant blood loss.  Standard therapeutic scope was reinserted into the patient's airway and used for aspiration of the bilateral mainstem's remove and remaining blood clots and debris.  At the end of the procedure a general airway inspection was performed and there was no evidence of active bleeding. The bronchoscope was removed.  The patient tolerated the procedure well. There was no significant blood loss and there were no obvious complications. A post-procedural chest x-ray is pending.  Samples Target #1: 1. Transbronchial needle brushings from left upper lobe 2. Transbronchial Wang needle biopsies from left upper lobe 3. Transbronchial forceps biopsies from upper lung  Samples Target #2: 1.  Transbronchial needle aspiration,  station 10L  Samples Target #3: 1.  Transbronchial needle aspiration, station 7  Plans:  The  patient will be discharged from the PACU to home when recovered from anesthesia and after chest x-ray is reviewed. We will review the cytology, pathology and microbiology results with the patient when they become available. Outpatient followup will be with Garner Nash, DO.   Garner Nash, DO [facility] 01/27/2021 2:51 PM

## 2021-01-29 LAB — CYTOLOGY - NON PAP

## 2021-01-31 ENCOUNTER — Encounter (HOSPITAL_COMMUNITY): Payer: Self-pay | Admitting: Pulmonary Disease

## 2021-02-02 DIAGNOSIS — C3490 Malignant neoplasm of unspecified part of unspecified bronchus or lung: Secondary | ICD-10-CM | POA: Diagnosis not present

## 2021-02-02 LAB — CYTOLOGY - NON PAP

## 2021-02-05 ENCOUNTER — Other Ambulatory Visit: Payer: Self-pay

## 2021-02-05 ENCOUNTER — Ambulatory Visit (INDEPENDENT_AMBULATORY_CARE_PROVIDER_SITE_OTHER): Payer: Medicare Other | Admitting: Pulmonary Disease

## 2021-02-05 ENCOUNTER — Encounter: Payer: Self-pay | Admitting: Pulmonary Disease

## 2021-02-05 VITALS — BP 126/80 | HR 74 | Temp 98.0°F | Ht 74.0 in | Wt 300.0 lb

## 2021-02-05 DIAGNOSIS — R062 Wheezing: Secondary | ICD-10-CM

## 2021-02-05 DIAGNOSIS — J449 Chronic obstructive pulmonary disease, unspecified: Secondary | ICD-10-CM

## 2021-02-05 DIAGNOSIS — C3490 Malignant neoplasm of unspecified part of unspecified bronchus or lung: Secondary | ICD-10-CM | POA: Diagnosis not present

## 2021-02-05 MED ORDER — TRELEGY ELLIPTA 100-62.5-25 MCG/ACT IN AEPB: 1.0000 | INHALATION_SPRAY | Freq: Every day | RESPIRATORY_TRACT | 0 refills | Status: DC

## 2021-02-05 MED ORDER — TRELEGY ELLIPTA 100-62.5-25 MCG/ACT IN AEPB: 1.0000 | INHALATION_SPRAY | Freq: Every day | RESPIRATORY_TRACT | 3 refills | Status: DC

## 2021-02-05 NOTE — Addendum Note (Signed)
Addended by: Fran Lowes on: 02/05/2021 11:56 AM   Modules accepted: Orders

## 2021-02-05 NOTE — Progress Notes (Signed)
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  Also had renal cell carcinoma CT-guided ablation in July 2020.  Now has concern for recurrent non-small cell disease with a new AP window node in July 2021 followed by Dr. Earlie Server.  Started on immunotherapy with Imfinzi in October 2021 status post 13 cycles.  Now had a repeat PET scan of the chest which was in January 04, 2021 by Dr. Earlie Server.  Patient was referred for consideration of tissue biopsy for a newly defined 6.8 cm posterior left upper lobe mass concerning for malignancy with associated left perihilar lesion.  Of note his initial diagnosis for malignancy was completed at [facility] per patient's sister who was present today in the office.  OV 01/20/2021: Here today to discuss biopsy of lung mass found on recent CT imaging.  OV 02/05/2021: Here today for follow-up after bronchoscopy.  Tissue sampling of the patient's mediastinal nodes and mass were consistent with small cell carcinoma.  He has already had his PET scan and CT.  He has follow-up with medical oncology next week.  From respiratory standpoint still has shortness of breath.  Occasional wakes up in the middle the night with wheezing and chest tightness.  Not using any inhalers.  He does have as needed albuterol.  He does not have a nebulizer at home.  He likely has underlying COPD we discussed this today in the office.   Oncology History  Non-small cell carcinoma of left lung, stage  3 ([place])  09/25/2019 Initial Diagnosis   Non-small cell carcinoma of left lung, stage 3 ([place])   09/30/2019 - 11/12/2019 Chemotherapy   The patient had palonosetron (ALOXI) injection 0.25 mg, 0.25 mg, Intravenous,  Once, 6 of 7 cycles Administration: 0.25 mg (09/30/2019), 0.25 mg (11/04/2019), 0.25 mg (11/12/2019), 0.25 mg (10/07/2019), 0.25 mg (10/14/2019), 0.25 mg (10/30/2019) CARBOplatin (PARAPLATIN) 300 mg in sodium chloride 0.9 % 250 mL chemo infusion, 300 mg (100 % of original dose 300 mg), Intravenous,  Once, 6 of 7 cycles Dose modification: 300 mg (original dose 300 mg, Cycle 1) Administration: 300 mg (09/30/2019), 300 mg (11/04/2019), 300 mg (11/12/2019), 300 mg (10/07/2019), 300 mg (10/14/2019), 300 mg (10/30/2019) PACLitaxel (TAXOL) 120 mg in sodium chloride 0.9 % 250 mL chemo infusion (</= 80mg /m2), 45 mg/m2 = 120 mg, Intravenous,  Once, 6 of 7 cycles Administration: 120 mg (09/30/2019), 120 mg (11/04/2019), 120 mg (11/12/2019), 120 mg (10/07/2019), 120 mg (10/14/2019), 120 mg (10/30/2019)   for chemotherapy treatment.     12/18/2019 -  Chemotherapy    Patient is on Treatment Plan: LUNG NSCLC DURVALUMAB Q28D          Past Medical History:  Diagnosis Date   Anxiety    Aortic atherosclerosis (HCC)    Arthritis    Chronic low back pain    COPD (chronic obstructive pulmonary disease) (HCC)    Coronary artery calcification seen on CT scan    Multivessel   Depression  Essential hypertension    Gait instability    Previous logging accident   GERD (gastroesophageal reflux disease)    Lung cancer ([place])    Poorly differentiated, XRT   Renal cell carcinoma (HCC)    Left, status post cryoablation August 2020     Family History  Problem Relation Age of Onset   Heart attack Mother    Renal Disease Mother    Heart failure Father    Heart attack Father    Diabetes Sister    Heart disease Brother      Past Surgical History:  Procedure Laterality Date   BRONCHIAL BIOPSY  01/27/2021   Procedure:  BRONCHIAL BIOPSIES;  Surgeon: Garner Nash, DO;  Location: [facility];  Service: Pulmonary;;   BRONCHIAL BRUSHINGS  01/27/2021   Procedure: BRONCHIAL BRUSHINGS;  Surgeon: Garner Nash, DO;  Location: [facility];  Service: Pulmonary;;   BRONCHIAL NEEDLE ASPIRATION BIOPSY  01/27/2021   Procedure: BRONCHIAL NEEDLE ASPIRATION BIOPSIES;  Surgeon: Garner Nash, DO;  Location: [facility];  Service: Pulmonary;;   CHOLECYSTECTOMY     2019   FRACTURE SURGERY     bil legs (logging trees)   IR RADIOLOGIST EVAL & MGMT  09/27/2018   IR RADIOLOGIST EVAL & MGMT  11/22/2018   IR RADIOLOGIST EVAL & MGMT  04/10/2019   IR RADIOLOGIST EVAL & MGMT  10/13/2020   NECK SURGERY  1994-1995   RADIOLOGY WITH ANESTHESIA Left 10/31/2018   Procedure: CT WITH ANESTHESIA RENAL CRYO ABLATION;  Surgeon: Aletta Edouard, MD;  Location: [facility];  Service: Radiology;  Laterality: Left;   VIDEO BRONCHOSCOPY WITH ENDOBRONCHIAL ULTRASOUND Bilateral 01/27/2021   Procedure: VIDEO BRONCHOSCOPY WITH ENDOBRONCHIAL ULTRASOUND;  Surgeon: Garner Nash, DO;  Location: [facility];  Service: Pulmonary;  Laterality: Bilateral;   VIDEO BRONCHOSCOPY WITH RADIAL ENDOBRONCHIAL ULTRASOUND  01/27/2021   Procedure: VIDEO BRONCHOSCOPY WITH RADIAL ENDOBRONCHIAL ULTRASOUND;  Surgeon: Garner Nash, DO;  Location: [facility];  Service: Pulmonary;;    Social History   Socioeconomic History   Marital status: Single    Spouse name: Not on file   Number of children: Not on file   Years of education: Not on file   Highest education level: Not on file  Occupational History   Not on file  Tobacco Use   Smoking status: Every Day    Packs/day: 0.50    Types: Cigarettes   Smokeless tobacco: Never   Tobacco comments:    10 a day  Vaping Use   Vaping Use: Never used  Substance and Sexual Activity   Alcohol use: Not Currently    Comment: BEER ONCE IN A WHILE    Drug use: Never   Sexual activity: Not Currently  Other Topics  Concern   Not on file  Social History Narrative   10-23-17 Unable to ask abuse questions wife and other family with him today.   Social Determinants of Health   Financial Resource Strain: Not on file  Food Insecurity: Not on file  Transportation Needs: Not on file  Physical Activity: Not on file  Stress: Not on file  Social Connections: Not on file  Intimate Partner Violence: Not on file     No Known Allergies   Outpatient Medications Prior to Visit  Medication Sig Dispense Refill   ALPRAZolam (XANAX) 0.5 MG tablet Take 0.5 mg by mouth at bedtime.     amLODipine (NORVASC) 10 MG tablet Take 10 mg by mouth daily.     aspirin  81 MG EC tablet Take 81 mg by mouth daily. Swallow whole.     buPROPion (WELLBUTRIN XL) 300 MG 24 hr tablet Take 300 mg by mouth daily.     busPIRone (BUSPAR) 15 MG tablet Take 15 mg by mouth daily.     cetirizine (ZYRTEC) 10 MG tablet Take 10 mg by mouth daily.     furosemide (LASIX) 40 MG tablet Take 40 mg by mouth daily.      hydrochlorothiazide (MICROZIDE) 12.5 MG capsule Take 12.5 mg by mouth daily.     liothyronine (CYTOMEL) 25 MCG tablet Take 25 mcg by mouth daily.     losartan (COZAAR) 100 MG tablet Take 100 mg by mouth daily.     Multiple Vitamins-Minerals (CENTRUM SILVER 50+MEN) TABS Take 1 tablet by mouth daily.     oxyCODONE-acetaminophen (PERCOCET) 10-325 MG tablet Take 1 tablet by mouth 4 (four) times daily as needed for pain.     pantoprazole (PROTONIX) 40 MG tablet Take 40 mg by mouth every evening.      Probiotic Product (PROBIOTIC DAILY PO) Take 1 capsule by mouth daily.     prochlorperazine (COMPAZINE) 10 MG tablet TAKE ONE TABLET EVERY 6 HOURS AS NEEDED 30 tablet 2   rosuvastatin (CRESTOR) 5 MG tablet Take 5 mg by mouth at bedtime.     No facility-administered medications prior to visit.    Review of Systems  Constitutional:  Negative for chills, fever, malaise/fatigue and weight loss.  HENT:  Negative for hearing loss, sore throat and  tinnitus.   Eyes:  Negative for blurred vision and double vision.  Respiratory:  Positive for shortness of breath. Negative for cough, hemoptysis, sputum production, wheezing and stridor.   Cardiovascular:  Negative for chest pain, palpitations, orthopnea, leg swelling and PND.  Gastrointestinal:  Negative for abdominal pain, constipation, diarrhea, heartburn, nausea and vomiting.  Genitourinary:  Negative for dysuria, hematuria and urgency.  Musculoskeletal:  Negative for joint pain and myalgias.  Skin:  Negative for itching and rash.  Neurological:  Negative for dizziness, tingling, weakness and headaches.  Endo/Heme/Allergies:  Negative for environmental allergies. Does not bruise/bleed easily.  Psychiatric/Behavioral:  Negative for depression. The patient is not nervous/anxious and does not have insomnia.   All other systems reviewed and are negative.   Objective:  Physical Exam Vitals reviewed.  Constitutional:      General: He is not in acute distress.    Appearance: He is well-developed. He is obese.  HENT:     Head: Normocephalic and atraumatic.  Eyes:     General: No scleral icterus.    Conjunctiva/sclera: Conjunctivae normal.     Pupils: Pupils are equal, round, and reactive to light.  Neck:     Vascular: No JVD.     Trachea: No tracheal deviation.  Cardiovascular:     Rate and Rhythm: Normal rate and regular rhythm.     Heart sounds: Normal heart sounds. No murmur heard. Pulmonary:     Effort: Pulmonary effort is normal. No tachypnea, accessory muscle usage or respiratory distress.     Breath sounds: No stridor. Wheezing present. No rhonchi or rales.  Abdominal:     General: There is no distension.     Palpations: Abdomen is soft.     Tenderness: There is no abdominal tenderness.  Musculoskeletal:        General: No tenderness.     Cervical back: Neck supple.  Lymphadenopathy:     Cervical: No cervical adenopathy.  Skin:  General: Skin is warm and dry.      Capillary Refill: Capillary refill takes less than 2 seconds.     Findings: No rash.  Neurological:     Mental Status: He is alert and oriented to person, place, and time.  Psychiatric:        Behavior: Behavior normal.     Vitals:   02/05/21 1106  BP: 126/80  Pulse: 74  Temp: 98 F (36.7 C)  TempSrc: Oral  SpO2: 96%  Weight: 300 lb (136.1 kg)  Height: 6\' 2"  (1.88 m)   96% on RA BMI Readings from Last 3 Encounters:  02/05/21 38.52 kg/m  01/27/21 44.95 kg/m  01/20/21 42.60 kg/m   Wt Readings from Last 3 Encounters:  02/05/21 300 lb (136.1 kg)  01/27/21 (!) 350 lb 1.5 oz (158.8 kg)  01/20/21 (!) 350 lb (158.8 kg)     CBC    Component Value Date/Time   WBC 7.5 01/27/2021 1045   RBC 4.94 01/27/2021 1045   HGB 13.7 01/27/2021 1045   HGB 13.0 12/14/2020 0811   HCT 42.3 01/27/2021 1045   PLT 274 01/27/2021 1045   PLT 245 12/14/2020 0811   MCV 85.6 01/27/2021 1045   MCH 27.7 01/27/2021 1045   MCHC 32.4 01/27/2021 1045   RDW 18.0 (H) 01/27/2021 1045   LYMPHSABS 1.5 12/14/2020 0811   MONOABS 0.6 12/14/2020 0811   EOSABS 0.2 12/14/2020 0811   BASOSABS 0.1 12/14/2020 0811     Chest Imaging: October CT chest: Enlarging mass in the left upper lobe associated fibrotic changes status post radiation therapy. The patient's images have been independently reviewed by me.    January 04, 2021 nuclear medicine PET scan: 6.8 cm posterior left upper lobe mass concerning for primary malignancy extending into the left hilum.  Possible nodal metastasis.  Overall imaging is concerning for potential recurrence of disease. The patient's images have been independently reviewed by me.    Pulmonary Functions Testing Results: PFT Results Latest Ref Rng & Units 08/03/2017  FVC-Pre L 4.62  FVC-Predicted Pre % 87  FVC-Post L 4.42  FVC-Predicted Post % 83  Pre FEV1/FVC % % 73  Post FEV1/FCV % % 76  FEV1-Pre L 3.35  FEV1-Predicted Pre % 84  FEV1-Post L 3.38  DLCO uncorrected  ml/min/mmHg 32.50  DLCO UNC% % 86  DLVA Predicted % 86  TLC L 8.61  TLC % Predicted % 110  RV % Predicted % 154    FeNO:   Pathology:  Please see oncology history as stated above in HPI with history of lung cancer.  01/27/2021:  By immunohistochemistry the neoplastic cells are positive for CD56,  synaptophysin (weak), TTF-1 and cytokeratin AE1/3 (weak dot-like) but  negative for p40 and cytokeratin 5/6.  Overall, these findings are  consistent with small cell carcinoma.   Echocardiogram:   Heart Catheterization:     Assessment & Plan:     ICD-10-CM   1. Malignant neoplasm of unspecified part of unspecified bronchus or lung ([place])  C34.90 MR BRAIN W WO CONTRAST    2. Small cell lung cancer (HCC)  C34.90 MR BRAIN W WO CONTRAST    3. Chronic obstructive pulmonary disease, unspecified COPD type ([place])  J44.9       Discussion: This is a 70 year old gentleman, history of stage I lung cancer status post SBRT followed up by an additional stage III non-small cell lung cancer status post chemo and radiation and immunotherapy.  Follows with Dr. Earlie Server.  He  had subsequent CT showing increased enlarging left mass with PET scan that was hypermetabolic concerning for recurrence of disease.  Patient was taken for bronchoscopy.  Tissue sampling of this area now confirmed small cell carcinoma  Plan: Due to the small cell carcinoma diagnosis MRI of the brain was a ordered. He has an appointment with medical oncology next week. We discussed his path results. Patient sister would like to have copies of his CT and PET scan imaging reports.  I have provided those today in the office well. As for his COPD we will start him on Trelegy 100 New order for a albuterol neb and new nebulizer. Return to clinic to see me in 6 months or as needed.   Current Outpatient Medications:    ALPRAZolam (XANAX) 0.5 MG tablet, Take 0.5 mg by mouth at bedtime., Disp: , Rfl:    amLODipine (NORVASC) 10 MG tablet,  Take 10 mg by mouth daily., Disp: , Rfl:    aspirin 81 MG EC tablet, Take 81 mg by mouth daily. Swallow whole., Disp: , Rfl:    buPROPion (WELLBUTRIN XL) 300 MG 24 hr tablet, Take 300 mg by mouth daily., Disp: , Rfl:    busPIRone (BUSPAR) 15 MG tablet, Take 15 mg by mouth daily., Disp: , Rfl:    cetirizine (ZYRTEC) 10 MG tablet, Take 10 mg by mouth daily., Disp: , Rfl:    furosemide (LASIX) 40 MG tablet, Take 40 mg by mouth daily. , Disp: , Rfl:    hydrochlorothiazide (MICROZIDE) 12.5 MG capsule, Take 12.5 mg by mouth daily., Disp: , Rfl:    liothyronine (CYTOMEL) 25 MCG tablet, Take 25 mcg by mouth daily., Disp: , Rfl:    losartan (COZAAR) 100 MG tablet, Take 100 mg by mouth daily., Disp: , Rfl:    Multiple Vitamins-Minerals (CENTRUM SILVER 50+MEN) TABS, Take 1 tablet by mouth daily., Disp: , Rfl:    oxyCODONE-acetaminophen (PERCOCET) 10-325 MG tablet, Take 1 tablet by mouth 4 (four) times daily as needed for pain., Disp: , Rfl:    pantoprazole (PROTONIX) 40 MG tablet, Take 40 mg by mouth every evening. , Disp: , Rfl:    Probiotic Product (PROBIOTIC DAILY PO), Take 1 capsule by mouth daily., Disp: , Rfl:    prochlorperazine (COMPAZINE) 10 MG tablet, TAKE ONE TABLET EVERY 6 HOURS AS NEEDED, Disp: 30 tablet, Rfl: 2   rosuvastatin (CRESTOR) 5 MG tablet, Take 5 mg by mouth at bedtime., Disp: , Rfl:    Garner Nash, DO [facility] 02/05/2021 11:23 AM

## 2021-02-05 NOTE — Patient Instructions (Addendum)
Thank you for visiting Dr. Valeta Harms at [facility]. Today we recommend the following:  Trelegy 100 samples + new prescription  Albuterol nebs and new Nebulizer machine   Return in about 6 months (around 08/23/2021), or if symptoms worsen or fail to improve, for with APP or Dr. Valeta Harms.    Please do your part to reduce the spread of COVID-19.

## 2021-02-05 NOTE — Addendum Note (Signed)
Addended by: Fran Lowes on: 02/05/2021 11:57 AM   Modules accepted: Orders

## 2021-02-08 ENCOUNTER — Telehealth: Payer: Self-pay | Admitting: Pulmonary Disease

## 2021-02-08 DIAGNOSIS — J449 Chronic obstructive pulmonary disease, unspecified: Secondary | ICD-10-CM | POA: Diagnosis not present

## 2021-02-08 DIAGNOSIS — M5020 Other cervical disc displacement, unspecified cervical region: Secondary | ICD-10-CM | POA: Diagnosis not present

## 2021-02-08 DIAGNOSIS — M5137 Other intervertebral disc degeneration, lumbosacral region: Secondary | ICD-10-CM | POA: Diagnosis not present

## 2021-02-08 DIAGNOSIS — I10 Essential (primary) hypertension: Secondary | ICD-10-CM | POA: Diagnosis not present

## 2021-02-08 MED ORDER — ALBUTEROL SULFATE (2.5 MG/3ML) 0.083% IN NEBU: 2.5000 mg | INHALATION_SOLUTION | Freq: Four times a day (QID) | RESPIRATORY_TRACT | 11 refills | Status: DC | PRN

## 2021-02-08 NOTE — Telephone Encounter (Signed)
ATC Paula, but no answer and VM was not set up at this time.  I called and spoke with Patient. Albuterol prescription was not sent to pharmacy. Patient requested albuterol nebs to be sent to [facility] [place]. Nothing further at this time.    Per Dr.Icard 02/05/21 AVS-  Instructions   Return in about 6 months (around 08/13/2021), or if symptoms worsen or fail to improve, for with APP or Dr. Valeta Harms. Thank you for visiting Dr. Valeta Harms at [facility]. Today we recommend the following:   Trelegy 100 samples + new prescription  Albuterol nebs and new Nebulizer machine    Return in about 6 months (around 08/23/2021), or if symptoms worsen or fail to improve, for with APP or Dr. Valeta Harms.

## 2021-02-09 ENCOUNTER — Inpatient Hospital Stay: Payer: Medicare Other | Attending: Internal Medicine | Admitting: Internal Medicine

## 2021-02-09 ENCOUNTER — Encounter: Payer: Self-pay | Admitting: Internal Medicine

## 2021-02-09 ENCOUNTER — Other Ambulatory Visit: Payer: Self-pay

## 2021-02-09 VITALS — BP 160/71 | HR 80 | Temp 97.8°F | Resp 20 | Ht 74.0 in

## 2021-02-09 DIAGNOSIS — Z5111 Encounter for antineoplastic chemotherapy: Secondary | ICD-10-CM | POA: Diagnosis not present

## 2021-02-09 DIAGNOSIS — Z5189 Encounter for other specified aftercare: Secondary | ICD-10-CM | POA: Diagnosis not present

## 2021-02-09 DIAGNOSIS — R0609 Other forms of dyspnea: Secondary | ICD-10-CM | POA: Diagnosis not present

## 2021-02-09 DIAGNOSIS — C3412 Malignant neoplasm of upper lobe, left bronchus or lung: Secondary | ICD-10-CM | POA: Insufficient documentation

## 2021-02-09 MED ORDER — PROCHLORPERAZINE MALEATE 10 MG PO TABS: 10.0000 mg | ORAL_TABLET | Freq: Four times a day (QID) | ORAL | 0 refills | Status: DC | PRN

## 2021-02-09 NOTE — Progress Notes (Signed)
START ON PATHWAY REGIMEN - Small Cell Lung     A cycle is every 21 days:     Carboplatin      Etoposide   **Always confirm dose/schedule in your pharmacy ordering system**  Patient Characteristics: Newly Diagnosed, Preoperative or Nonsurgical Candidate (Clinical Staging), First Line, Limited Stage, Nonsurgical Candidate Therapeutic Status: Newly Diagnosed, Preoperative or Nonsurgical Candidate (Clinical Staging) AJCC T Category: cT3 AJCC N Category: cN0 AJCC M Category: cM0 AJCC 8 Stage Grouping: IIB Stage Classification: Limited Surgical Candidacy: Nonsurgical Candidate Intent of Therapy: Curative Intent, Discussed with Patient

## 2021-02-09 NOTE — Progress Notes (Signed)
[facility] Telephone:([phone]) [phone]   Fax:(336) [phone]  OFFICE PROGRESS NOTE  [name], MD [place] [address]  DIAGNOSIS:  1) Limited stage (T3, N0, M0) small cell lung cancer diagnosed in November 2022. 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  Diagnosed in 2019. 3) stage Ia3 (T1cN0M0) non-small cell lung cancer.  Diagnosed with a left suprahilar region with AP window adenopathy.  Status post SBRT 4) renal cell carcinoma status post CT-guided ablation in July 2020.  Followed by Dr. [name]. 5) recurrent non-small cell lung cancer in July 2021.  Patient presented with new adenopathy in the AP window. Inaccessible to biopsy   PRIOR THERAPY:  1) SBRT to the LUL nodule in 2019 under the care of Dr. Lisbeth Renshaw 2) SBRT to the left suprahilar mass under the care of Dr. Lisbeth Renshaw in March 2020 3) CT guided ablation to the renal cell carcinoma under the care of Dr. Kathlene Cote. 40 Concurrent chemoradiation with carboplatin for an AUC of 2 and paclitaxel 45 mg/m.  First dose on 09/30/2019.  Status post 6 cycles.  Last cycle was given on 11/12/2019 with partial response. 4) Consolidation treatment with immunotherapy with Imfinzi 1500 mg IV every 4 weeks.  First dose December 18, 2019.  Status post 13 cycles.     CURRENT THERAPY: Systemic chemotherapy with carboplatin for AUC of 5 on day 1 and 2 etoposide 100 Mg/M2 on days 1, 2 and 3 concurrent with radiotherapy.  First dose February 16, 2021.  INTERVAL HISTORY: Reginald Berry 70 y.o. male returns to the clinic today for follow-up visit accompanied by his sister.  The patient is feeling fine today with no concerning complaints except for the baseline shortness of breath increased with exertion.  He also has cough and started on Trelegy and inhaler by Dr. Valeta Harms.  The patient was found on recent CT scan of the chest as well as PET scan to have evidence for new  left upper lobe lung mass.  He underwent bronchoscopy with biopsy under the care of Dr. Valeta Harms and unfortunately the diagnosis was consistent with small cell lung cancer which is different from his previous diagnosis of non-small cell lung cancer.  He denied having any recent weight loss or night sweats.  He has no nausea, vomiting, diarrhea or constipation.  He has no headache or visual changes.  He is scheduled for MRI of the brain next week.  He is here for evaluation and discussion of his treatment options    MEDICAL HISTORY: Past Medical History:  Diagnosis Date   Anxiety    Aortic atherosclerosis (HCC)    Arthritis    Chronic low back pain    COPD (chronic obstructive pulmonary disease) (HCC)    Coronary artery calcification seen on CT scan    Multivessel   Depression    Essential hypertension    Gait instability    Previous logging accident   GERD (gastroesophageal reflux disease)    Lung cancer ([facility])    Poorly differentiated, XRT   Renal cell carcinoma ([facility])    Left, status post cryoablation August 2020    ALLERGIES:  has No Known Allergies.  MEDICATIONS:  Current Outpatient Medications  Medication Sig Dispense Refill   albuterol (PROVENTIL) (2.5 MG/3ML) 0.083% nebulizer solution Take 3 mLs (2.5 mg total) by nebulization every 6 (six) hours as needed for wheezing or shortness of breath. [place]  mL 11   ALPRAZolam (XANAX) 0.5 MG tablet Take 0.5 mg by mouth at bedtime.     amLODipine (NORVASC) 10 MG tablet Take 10 mg by mouth daily.     aspirin 81 MG EC tablet Take 81 mg by mouth daily. Swallow whole.     buPROPion (WELLBUTRIN XL) 300 MG 24 hr tablet Take 300 mg by mouth daily.     busPIRone (BUSPAR) 15 MG tablet Take 15 mg by mouth daily.     cetirizine (ZYRTEC) 10 MG tablet Take 10 mg by mouth daily.     Fluticasone-Umeclidin-Vilant (TRELEGY ELLIPTA) 100-62.5-25 MCG/ACT AEPB Inhale 1 puff into the lungs daily. 60 each 0   Fluticasone-Umeclidin-Vilant (TRELEGY ELLIPTA)  100-62.5-25 MCG/ACT AEPB Inhale 1 puff into the lungs daily. 60 each 3   furosemide (LASIX) 40 MG tablet Take 40 mg by mouth daily.      hydrochlorothiazide (MICROZIDE) 12.5 MG capsule Take 12.5 mg by mouth daily.     liothyronine (CYTOMEL) 25 MCG tablet Take 25 mcg by mouth daily.     losartan (COZAAR) 100 MG tablet Take 100 mg by mouth daily.     Multiple Vitamins-Minerals (CENTRUM SILVER 50+MEN) TABS Take 1 tablet by mouth daily.     oxyCODONE-acetaminophen (PERCOCET) 10-325 MG tablet Take 1 tablet by mouth 4 (four) times daily as needed for pain.     pantoprazole (PROTONIX) 40 MG tablet Take 40 mg by mouth every evening.      Probiotic Product (PROBIOTIC DAILY PO) Take 1 capsule by mouth daily.     prochlorperazine (COMPAZINE) 10 MG tablet TAKE ONE TABLET EVERY 6 HOURS AS NEEDED 30 tablet 2   rosuvastatin (CRESTOR) 5 MG tablet Take 5 mg by mouth at bedtime.     No current facility-administered medications for this visit.    SURGICAL HISTORY:  Past Surgical History:  Procedure Laterality Date   BRONCHIAL BIOPSY  01/27/2021   Procedure: BRONCHIAL BIOPSIES;  Surgeon: Garner Nash, DO;  Location: [facility];  Service: Pulmonary;;   BRONCHIAL BRUSHINGS  01/27/2021   Procedure: BRONCHIAL BRUSHINGS;  Surgeon: Garner Nash, DO;  Location: [facility];  Service: Pulmonary;;   BRONCHIAL NEEDLE ASPIRATION BIOPSY  01/27/2021   Procedure: BRONCHIAL NEEDLE ASPIRATION BIOPSIES;  Surgeon: Garner Nash, DO;  Location: [facility];  Service: Pulmonary;;   CHOLECYSTECTOMY     2019   FRACTURE SURGERY     bil legs (logging trees)   IR RADIOLOGIST EVAL & MGMT  09/27/2018   IR RADIOLOGIST EVAL & MGMT  11/22/2018   IR RADIOLOGIST EVAL & MGMT  04/10/2019   IR RADIOLOGIST EVAL & MGMT  10/13/2020   NECK SURGERY  1994-1995   RADIOLOGY WITH ANESTHESIA Left 10/31/2018   Procedure: CT WITH ANESTHESIA RENAL CRYO ABLATION;  Surgeon: Aletta Edouard, MD;  Location: [facility];  Service: Radiology;   Laterality: Left;   VIDEO BRONCHOSCOPY WITH ENDOBRONCHIAL ULTRASOUND Bilateral 01/27/2021   Procedure: VIDEO BRONCHOSCOPY WITH ENDOBRONCHIAL ULTRASOUND;  Surgeon: Garner Nash, DO;  Location: [facility];  Service: Pulmonary;  Laterality: Bilateral;   VIDEO BRONCHOSCOPY WITH RADIAL ENDOBRONCHIAL ULTRASOUND  01/27/2021   Procedure: VIDEO BRONCHOSCOPY WITH RADIAL ENDOBRONCHIAL ULTRASOUND;  Surgeon: Garner Nash, DO;  Location: [facility];  Service: Pulmonary;;    REVIEW OF SYSTEMS:  Constitutional: positive for fatigue Eyes: negative Ears, nose, mouth, throat, and face: negative Respiratory: positive for cough and dyspnea on exertion Cardiovascular: negative Gastrointestinal: negative Genitourinary:negative Integument/breast: negative Hematologic/lymphatic: negative Musculoskeletal:negative Neurological: negative Behavioral/Psych: negative Endocrine:  negative Allergic/Immunologic: negative   PHYSICAL EXAMINATION: General appearance: alert, cooperative, fatigued, and no distress Head: Normocephalic, without obvious abnormality, atraumatic Neck: no adenopathy, no JVD, supple, symmetrical, trachea midline, and thyroid not enlarged, symmetric, no tenderness/mass/nodules Lymph nodes: Cervical, supraclavicular, and axillary nodes normal. Resp: clear to auscultation bilaterally Back: symmetric, no curvature. ROM normal. No CVA tenderness. Cardio: regular rate and rhythm, S1, S2 normal, no murmur, click, rub or gallop GI: soft, non-tender; bowel sounds normal; no masses,  no organomegaly Extremities: extremities normal, atraumatic, no cyanosis or edema Neurologic: Alert and oriented X 3, normal strength and tone. Normal symmetric reflexes. Normal coordination and gait  ECOG PERFORMANCE STATUS: 1 - Symptomatic but completely ambulatory  Blood pressure (!) 160/71, pulse 80, temperature 97.8 F (36.6 C), temperature source Tympanic, resp. rate 20, height 6\' 2"  (1.88 m), SpO2 99  %.  LABORATORY DATA: Lab Results  Component Value Date   WBC 7.5 01/27/2021   HGB 13.7 01/27/2021   HCT 42.3 01/27/2021   MCV 85.6 01/27/2021   PLT 274 01/27/2021      Chemistry      Component Value Date/Time   NA 133 (L) 01/27/2021 1200   K 3.9 01/27/2021 1200   CL 101 01/27/2021 1200   CO2 24 01/27/2021 1200   BUN 10 01/27/2021 1200   CREATININE 0.88 01/27/2021 1200   CREATININE 1.11 12/14/2020 0811      Component Value Date/Time   CALCIUM 8.8 (L) 01/27/2021 1200   ALKPHOS 88 12/14/2020 0811   AST 17 12/14/2020 0811   ALT 14 12/14/2020 0811   BILITOT 0.5 12/14/2020 0811       RADIOGRAPHIC STUDIES: DG CHEST PORT 1 VIEW  Result Date: 01/27/2021 CLINICAL DATA:  Status post bronchoscopy. EXAM: PORTABLE CHEST 1 VIEW COMPARISON:  Chest x-ray dated October 26, 2018 FINDINGS: Cardiac and mediastinal contours appear increased in size when compared with prior chest x-rays. Increased density of the left upper lobe opacity, likely post bronchoscopy changes. Mild bibasilar opacities, likely due to atelectasis. No evidence of pleural effusion pneumothorax. IMPRESSION: 1. No pleural effusion or pneumothorax status post bronchoscopy. 2. Cardiac and mediastinal contours appear enlarged when compared with prior chest x-rays, possibly artifactual and related to technique. This could be further evaluated with PA and lateral chest radiograph. Electronically Signed   By: Yetta Glassman M.D.   On: 01/27/2021 15:36   DG C-ARM BRONCHOSCOPY  Result Date: 01/27/2021 C-ARM BRONCHOSCOPY: Fluoroscopy was utilized by the requesting physician.  No radiographic interpretation.    ASSESSMENT AND PLAN: This is a very pleasant 70 years old white male with recurrent non-small cell lung cancer presenting as a stage IIIa with mediastinal lymphadenopathy in July 2021.  The patient has a history of early stage non-small cell lung cancer, adenosquamous carcinoma as stage Ib of the left upper lobe status post  SBRT in 2019.  He also has a stage Ia3 non-small cell lung cancer status post SBRT to the left suprahilar region and AP window adenopathy. He completed a course of concurrent chemoradiation with weekly carboplatin and paclitaxel status post 6 cycles and tolerated his treatment well. The patient completed treatment with immunotherapy with Imfinzi 1500 mg IV every 4 weeks status post 13 cycles.   The patient has been tolerating this treatment well with no concerning adverse effect except for mild fatigue. The patient had CT scan of the chest on December 18, 2020 followed by the PET scan and it showed that showed interval development of 6.3 x 3.8 x  2.6 cm mass in the periphery of the left upper lobe concerning for recurrent neoplasm.  The PET scan showed the 6.8 cm posterior left upper lobe mass was hypermetabolic and compatible with primary bronchogenic neoplasm and extending to the left perihilar region with some involvement of of the left posterior-lateral third and fourth ribs associated with pathologic fracture. The patient underwent bronchoscopy with biopsy under the care of Dr. Valeta Harms and the final pathology was consistent with small cell lung cancer. I had a lengthy discussion with the patient and his sister today about his current disease stage, prognosis and treatment options. He looks like he has a limited stage (T3, N0, M0) small cell lung cancer at this point.  I discussed with the patient his treatment options and recommended for him a course of systemic chemotherapy with carboplatin for AUC of 5 on day 1, etoposide 100 Mg/M2 on days 1, 2 and 3 for 4 cycles.  This will be concurrent with radiotherapy and I will refer the patient to Dr. Lisbeth Renshaw for evaluation and discussion of the radiotherapy option. I discussed with the patient the adverse effect of this treatment including but not limited to alopecia, myelosuppression, nausea and vomiting, peripheral neuropathy, liver or renal dysfunction. Is  expected to start the first cycle of this treatment next week. I will see him back for follow-up visit in 2 weeks for evaluation and management of any adverse effect of his treatment. I will call his pharmacy with prescription for Compazine 10 mg p.o. every 6 hours as needed for nausea. The patient was advised to call immediately if he has any other concerning symptoms in the interval.  The patient voices understanding of current disease status and treatment options and is in agreement with the current care plan. All questions were answered. The patient knows to call the clinic with any problems, questions or concerns. We can certainly see the patient much sooner if necessary.   Disclaimer: This note was dictated with voice recognition software. Similar sounding words can inadvertently be transcribed and may not be corrected upon review.

## 2021-02-09 NOTE — Patient Instructions (Signed)
Steps to Quit Smoking Smoking tobacco is the leading cause of preventable death. It can affect almost every organ in the body. Smoking puts you and people around you at risk for many serious, long-lasting (chronic) diseases. Quitting smoking can be hard, but it is one of the best things that you can do for your health. It is never too late to quit. How do I get ready to quit? When you decide to quit smoking, make a plan to help you succeed. Before you quit: Pick a date to quit. Set a date within the next 2 weeks to give you time to prepare. Write down the reasons why you are quitting. Keep this list in places where you will see it often. Tell your family, friends, and co-workers that you are quitting. Their support is important. Talk with your doctor about the choices that may help you quit. Find out if your health insurance will pay for these treatments. Know the people, places, things, and activities that make you want to smoke (triggers). Avoid them. What first steps can I take to quit smoking? Throw away all cigarettes at home, at work, and in your car. Throw away the things that you use when you smoke, such as ashtrays and lighters. Clean your car. Make sure to empty the ashtray. Clean your home, including curtains and carpets. What can I do to help me quit smoking? Talk with your doctor about taking medicines and seeing a counselor at the same time. You are more likely to succeed when you do both. If you are pregnant or breastfeeding, talk with your doctor about counseling or other ways to quit smoking. Do not take medicine to help you quit smoking unless your doctor tells you to do so. To quit smoking: Quit right away Quit smoking totally, instead of slowly cutting back on how much you smoke over a period of time. Go to counseling. You are more likely to quit if you go to counseling sessions regularly. Take medicine You may take medicines to help you quit. Some medicines need a  prescription, and some you can buy over-the-counter. Some medicines may contain a drug called nicotine to replace the nicotine in cigarettes. Medicines may: Help you to stop having the desire to smoke (cravings). Help to stop the problems that come when you stop smoking (withdrawal symptoms). Your doctor may ask you to use: Nicotine patches, gum, or lozenges. Nicotine inhalers or sprays. Non-nicotine medicine that is taken by mouth. Find resources Find resources and other ways to help you quit smoking and remain smoke-free after you quit. These resources are most helpful when you use them often. They include: Online chats with a counselor. Phone quitlines. Printed self-help materials. Support groups or group counseling. Text messaging programs. Mobile phone apps. Use apps on your mobile phone or tablet that can help you stick to your quit plan. There are many free apps for mobile phones and tablets as well as websites. Examples include Quit Guide from the CDC and smokefree.gov  What things can I do to make it easier to quit?  Talk to your family and friends. Ask them to support and encourage you. Call a phone quitline (1-800-QUIT-NOW), reach out to support groups, or work with a counselor. Ask people who smoke to not smoke around you. Avoid places that make you want to smoke, such as: Bars. Parties. Smoke-break areas at work. Spend time with people who do not smoke. Lower the stress in your life. Stress can make you want to   smoke. Try these things to help your stress: Getting regular exercise. Doing deep-breathing exercises. Doing yoga. Meditating. Doing a body scan. To do this, close your eyes, focus on one area of your body at a time from head to toe. Notice which parts of your body are tense. Try to relax the muscles in those areas. How will I feel when I quit smoking? Day 1 to 3 weeks Within the first 24 hours, you may start to have some problems that come from quitting tobacco.  These problems are very bad 2-3 days after you quit, but they do not often last for more than 2-3 weeks. You may get these symptoms: Mood swings. Feeling restless, nervous, angry, or annoyed. Trouble concentrating. Dizziness. Strong desire for high-sugar foods and nicotine. Weight gain. Trouble pooping (constipation). Feeling like you may vomit (nausea). Coughing or a sore throat. Changes in how the medicines that you take for other issues work in your body. Depression. Trouble sleeping (insomnia). Week 3 and afterward After the first 2-3 weeks of quitting, you may start to notice more positive results, such as: Better sense of smell and taste. Less coughing and sore throat. Slower heart rate. Lower blood pressure. Clearer skin. Better breathing. Fewer sick days. Quitting smoking can be hard. Do not give up if you fail the first time. Some people need to try a few times before they succeed. Do your best to stick to your quit plan, and talk with your doctor if you have any questions or concerns. Summary Smoking tobacco is the leading cause of preventable death. Quitting smoking can be hard, but it is one of the best things that you can do for your health. When you decide to quit smoking, make a plan to help you succeed. Quit smoking right away, not slowly over a period of time. When you start quitting, seek help from your doctor, family, or friends. This information is not intended to replace advice given to you by your health care provider. Make sure you discuss any questions you have with your health care provider. Document Revised: 10/30/2020 Document Reviewed: 05/12/2018 Elsevier Patient Education  2022 Elsevier Inc.  

## 2021-02-10 ENCOUNTER — Telehealth: Payer: Self-pay

## 2021-02-10 ENCOUNTER — Encounter: Payer: Self-pay | Admitting: General Practice

## 2021-02-10 ENCOUNTER — Ambulatory Visit: Payer: Medicare Other | Admitting: Radiation Oncology

## 2021-02-10 ENCOUNTER — Ambulatory Visit: Payer: Medicare Other

## 2021-02-10 NOTE — Telephone Encounter (Signed)
Spoke w/ patient's sister "[name]" who is authorized to speak on behalf of patient and was identified using 2 identifiers. I reminded her of patient's Reginald Berry) appointment today 02/10/21 at 1:00pm w/ Shona Simpson PA-C and to arrive no later than 12:45pm for check-in. [name] verbalized understanding and states "She will be the one bringing Mr. Reginald Berry to his appointment." I left my extension [phone] to call if any issues.

## 2021-02-10 NOTE — Progress Notes (Signed)
[facility] CSW Progress Notes  Call from sister Rider Ermis [phone]).  She is concerned about multiple upcoming appointments.  Although he has Medicaid and could use Medicaid transport services, he prefers sister to transport.  She is asking for any available resources for gas assistance.  He has one more [employer] left - CSW will provide during infusion on 12/15.  Sister advised to speak w [facility] ADvocate L White re [employer] remaining funds.  When he is eligible again (every 4 months), he can apply to [place] for a gas card.    Edwyna Shell, LCSW Clinical Social Worker Phone:  [phone]

## 2021-02-11 ENCOUNTER — Telehealth: Payer: Self-pay | Admitting: Internal Medicine

## 2021-02-11 NOTE — Progress Notes (Signed)
The following biosimilar Ziextenzo (pegfilgrastim-bmez) has been selected for use in this patient.  Kennith Center, Pharm.D., CPP 02/11/2021@11 :33 AM

## 2021-02-11 NOTE — Telephone Encounter (Signed)
Sch per 12/7 los, pt sister called prior  and made aware of appts.

## 2021-02-16 ENCOUNTER — Encounter: Payer: Self-pay | Admitting: Radiation Oncology

## 2021-02-16 ENCOUNTER — Other Ambulatory Visit: Payer: Self-pay | Admitting: Physician Assistant

## 2021-02-16 ENCOUNTER — Other Ambulatory Visit: Payer: Self-pay

## 2021-02-16 ENCOUNTER — Ambulatory Visit
Admission: RE | Admit: 2021-02-16 | Discharge: 2021-02-16 | Disposition: A | Discharge status: Home / Self Care | Payer: Medicare Other | Source: Ambulatory Visit | Attending: Radiation Oncology | Admitting: Radiation Oncology

## 2021-02-16 ENCOUNTER — Ambulatory Visit: Payer: Medicare Other

## 2021-02-16 ENCOUNTER — Telehealth: Payer: Self-pay

## 2021-02-16 ENCOUNTER — Other Ambulatory Visit: Payer: Medicare Other

## 2021-02-16 VITALS — BP 145/64 | HR 84 | Temp 97.8°F | Resp 20 | Ht 74.0 in | Wt 300.0 lb

## 2021-02-16 DIAGNOSIS — C3412 Malignant neoplasm of upper lobe, left bronchus or lung: Secondary | ICD-10-CM

## 2021-02-16 MED FILL — Fosaprepitant Dimeglumine For IV Infusion 150 MG (Base Eq): INTRAVENOUS | Qty: 5 | Status: AC

## 2021-02-16 MED FILL — Dexamethasone Sodium Phosphate Inj 100 MG/10ML: INTRAMUSCULAR | Qty: 1 | Status: AC

## 2021-02-16 NOTE — Progress Notes (Signed)
Patient reports LUQ chest pain 2/10 and dorsalgia on occasion 10/10. No other symptoms reported at this time.  Meaningful use complete.  BP (!) 145/64 (BP Location: Left Arm, Patient Position: Sitting, Cuff Size: Large)    Pulse 84    Temp 97.8 F (36.6 C) (Oral)    Resp 20    Ht 6\' 2"  (1.88 m)    Wt 300 lb (136.1 kg)    SpO2 100%    BMI 38.52 kg/m

## 2021-02-16 NOTE — Progress Notes (Signed)
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 Recent diagnosis of Small Cell Carcinoma of the LUL.  Narrative:  Mr. Reginald Berry is a pleasant 70 y.o. gentleman with a history of multifocal stage I NSCLC of the left lung. The patient was originally diagnosed with an adenosquamous carcinoma of the LUL in 2019 and received SBRT as he was not a candidate for surgical resection. He has been followed in surveillance in our clinic and was noted to have a new enlarging lesion in the left suprahilar region. He did undergo further work up with PET in March 2020 showed significant hypermetabolism in the suprahilar lesion as well as an exophytic lesion in the left kidney. He proceeded with SBRT to the suprahilar lesion and post treatment CT chest on 08/16/2018 revealed post radiotherapy change in the LUL and no solid nodule was visible. There was also postradiotherapy change in the LUL closer to the site of the suprahilar treatment. He had an MRI abdomen on 09/12/2018 that revealed a solid 2.7 cm solid mass in the left kidney consistent with renal cell carcinoma and met with Dr. [name]. On 10/31/2018 he underwent CT guided ablation of the left renal lesion by Dr. Kathlene Cote and he follows along with Dr. [name] in urology. He has been NED in the lungs, but recent CT on 09/10/19 revealed concerns for new adenopathy in the AP window region. Dr. Lisbeth Renshaw evaluated this and recommended a PET scan that was performed on 09/17/19 revealing hypermetabolism in the prevascular node measuring 14 mm (4 mm previously on CT in December 2020) with an SUV of 36.1. There was right axillary  skin thickening and hypermetabolism of 5.4 SUV, and hypermetabolism in bilateral parotid nodules that were read as stable from prior PET with an SUV of 9.1, though it was 6.6 previously.   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 He tolerated this well. He has been followed by Dr. Julien Nordmann and had a partial response to therapy and continued consolidative immunotherapy with Imfinzi from 12/18/19-11/18/20. He had CT imaging as well in August 2022 with Dr. Kathlene Cote and his left kidney showed stable post treatment changes from prior cryoablation. He had repeat CT chest on 12/17/20 showing an interval development of a 6.3 cm in the periphery of the LUL. He had a PET on 01/04/21 that showed hypermetabolism in the LUL mass measuring up to 6.8 cm, and this extended into the left perihilar region with overlying involvement of the left posterolateral 3rd and 4th ribs with associated pathologic fracture. Mediastinal adenopathy with an SUV of 6 in a 10 mm left superior mediastinal node and 9 mm subcarinal node with an SUV of 4.2. He underwent bronchoscopy on 01/27/21 showed malignancy in the LUL FNA and brushings. FNA of his 10L and level 7 node were also malignant, and IHC confirmed these were each small cell carcinoma. He is scheduled to start carboplatin/etoposide on 02/18/21. He's seen today to discuss additional radiotherapy options.   On review of systems the  patient states he is feeling pretty well overall. He denies any new progressive shortness of breath, but has used Triligy and Albuterol which improvement of his breathing overall. He does have some occasional localized pain in the chest wall but this has improved from prior visits with the chest wall pain he's had from prior SBRT. He reports he did tolerate his  last course of chemotherapy. No other complaints are verbalized.   Prior Radiation:    10/01/2019 through 11/13/2019: The patient was treated to the disease within the left mediastinum to a dose of 60 Gy using a 4 field, 3-D conformal technique.    06/26/2018-07/06/2018 SBRT Treatment: The tumor in the left suprahilar lung region was treated to 60Gy in 5 fractions at 12 Gy per fraction  08/30/2017-09/08/2017 SBRT Treatment: The tumor in the LUL was treated with a course of stereotactic body radiation treatment. The patient received 60 Gy in 5 fractions at 12 Gy per fraction.   PAST MEDICAL HISTORY:  Past Medical History:  Diagnosis Date   Anxiety    Aortic atherosclerosis (HCC)    Arthritis    Chronic low back pain    COPD (chronic obstructive pulmonary disease) (HCC)    Coronary artery calcification seen on CT scan    Multivessel   Depression    Essential hypertension    Gait instability    Previous logging accident   GERD (gastroesophageal reflux disease)    Lung cancer ([place])    Poorly differentiated, XRT   Renal cell carcinoma (HCC)    Left, status post cryoablation August 2020    PAST SURGICAL HISTORY: Past Surgical History:  Procedure Laterality Date   BRONCHIAL BIOPSY  01/27/2021   Procedure: BRONCHIAL BIOPSIES;  Surgeon: Garner Nash, DO;  Location: [facility];  Service: Pulmonary;;   BRONCHIAL BRUSHINGS  01/27/2021   Procedure: BRONCHIAL BRUSHINGS;  Surgeon: Garner Nash, DO;  Location: [facility];  Service: Pulmonary;;   BRONCHIAL NEEDLE ASPIRATION BIOPSY  01/27/2021   Procedure: BRONCHIAL NEEDLE ASPIRATION BIOPSIES;  Surgeon: Garner Nash, DO;  Location: [facility];  Service: Pulmonary;;   CHOLECYSTECTOMY     2019   FRACTURE SURGERY     bil legs (logging trees)   IR RADIOLOGIST EVAL & MGMT  09/27/2018   IR RADIOLOGIST EVAL & MGMT  11/22/2018   IR RADIOLOGIST EVAL & MGMT  04/10/2019   IR RADIOLOGIST EVAL & MGMT  10/13/2020   NECK SURGERY  1994-1995    RADIOLOGY WITH ANESTHESIA Left 10/31/2018   Procedure: CT WITH ANESTHESIA RENAL CRYO ABLATION;  Surgeon: Aletta Edouard, MD;  Location: [facility];  Service: Radiology;  Laterality: Left;   VIDEO BRONCHOSCOPY WITH ENDOBRONCHIAL ULTRASOUND Bilateral 01/27/2021   Procedure: VIDEO BRONCHOSCOPY WITH ENDOBRONCHIAL ULTRASOUND;  Surgeon: Garner Nash, DO;  Location: [facility];  Service: Pulmonary;  Laterality: Bilateral;   VIDEO BRONCHOSCOPY WITH RADIAL ENDOBRONCHIAL ULTRASOUND  01/27/2021   Procedure: VIDEO BRONCHOSCOPY WITH RADIAL ENDOBRONCHIAL ULTRASOUND;  Surgeon: Garner Nash, DO;  Location: [facility];  Service: Pulmonary;;    PAST SOCIAL HISTORY:  Social History   Socioeconomic History   Marital status: Single    Spouse name: Not on file   Number of children: Not on file   Years of education: Not on file   Highest education level: Not on file  Occupational History   Not on file  Tobacco Use   Smoking status: Every Day    Packs/day: 0.50    Types: Cigarettes   Smokeless tobacco:  Never   Tobacco comments:    10 a day  Vaping Use   Vaping Use: Never used  Substance and Sexual Activity   Alcohol use: Not Currently    Comment: BEER ONCE IN A WHILE    Drug use: Never   Sexual activity: Not Currently  Other Topics Concern   Not on file  Social History Narrative   10-23-17 Unable to ask abuse questions wife and other family with him today.   Social Determinants of Health   Financial Resource Strain: Not on file  Food Insecurity: Not on file  Transportation Needs: Unmet Transportation Needs   Lack of Transportation (Medical): Yes   Lack of Transportation (Non-Medical): Yes  Physical Activity: Not on file  Stress: Not on file  Social Connections: Not on file  Intimate Partner Violence: Not on file    PAST FAMILY HISTORY: Family History  Problem Relation Age of Onset   Heart attack Mother    Renal Disease Mother    Heart failure Father    Heart attack Father     Diabetes Sister    Heart disease Brother     MEDICATIONS  Current Outpatient Medications  Medication Sig Dispense Refill   albuterol (PROVENTIL) (2.5 MG/3ML) 0.083% nebulizer solution Take 3 mLs (2.5 mg total) by nebulization every 6 (six) hours as needed for wheezing or shortness of breath. 360 mL 11   ALPRAZolam (XANAX) 0.5 MG tablet Take 0.5 mg by mouth at bedtime.     amLODipine (NORVASC) 10 MG tablet Take 10 mg by mouth daily.     aspirin 81 MG EC tablet Take 81 mg by mouth daily. Swallow whole.     buPROPion (WELLBUTRIN XL) 300 MG 24 hr tablet Take 300 mg by mouth daily.     busPIRone (BUSPAR) 15 MG tablet Take 15 mg by mouth daily.     cetirizine (ZYRTEC) 10 MG tablet Take 10 mg by mouth daily.     Fluticasone-Umeclidin-Vilant (TRELEGY ELLIPTA) 100-62.5-25 MCG/ACT AEPB Inhale 1 puff into the lungs daily. 60 each 0   Fluticasone-Umeclidin-Vilant (TRELEGY ELLIPTA) 100-62.5-25 MCG/ACT AEPB Inhale 1 puff into the lungs daily. 60 each 3   furosemide (LASIX) 40 MG tablet Take 40 mg by mouth daily.      hydrochlorothiazide (MICROZIDE) 12.5 MG capsule Take 12.5 mg by mouth daily.     liothyronine (CYTOMEL) 25 MCG tablet Take 25 mcg by mouth daily.     losartan (COZAAR) 100 MG tablet Take 100 mg by mouth daily.     Multiple Vitamins-Minerals (CENTRUM SILVER 50+MEN) TABS Take 1 tablet by mouth daily.     oxyCODONE-acetaminophen (PERCOCET) 10-325 MG tablet Take 1 tablet by mouth 4 (four) times daily as needed for pain.     pantoprazole (PROTONIX) 40 MG tablet Take 40 mg by mouth every evening.      Probiotic Product (PROBIOTIC DAILY PO) Take 1 capsule by mouth daily.     prochlorperazine (COMPAZINE) 10 MG tablet TAKE ONE TABLET EVERY 6 HOURS AS NEEDED 30 tablet 2   prochlorperazine (COMPAZINE) 10 MG tablet Take 1 tablet (10 mg total) by mouth every 6 (six) hours as needed for nausea or vomiting. 30 tablet 0   rosuvastatin (CRESTOR) 5 MG tablet Take 5 mg by mouth at bedtime.     No current  facility-administered medications for this encounter.    ALLERGIES: No Known Allergies  PHYSICAL EXAM: Wt Readings from Last 3 Encounters:  02/16/21 300 lb (136.1 kg)  02/05/21 300 lb (  136.1 kg)  01/27/21 (!) 350 lb 1.5 oz (158.8 kg)   Temp Readings from Last 3 Encounters:  02/16/21 97.8 F (36.6 C) (Oral)  02/09/21 97.8 F (36.6 C) (Tympanic)  02/05/21 98 F (36.7 C) (Oral)   BP Readings from Last 3 Encounters:  02/16/21 (!) 145/64  02/09/21 (!) 160/71  02/05/21 126/80   Pulse Readings from Last 3 Encounters:  02/16/21 84  02/09/21 80  02/05/21 74   In general this is a well appearing caucasian male in no acute distress. He's alert and oriented x4 and appropriate throughout the examination. Cardiopulmonary assessment is negative for acute distress and he exhibits normal effort.     Impression/Plan: 1. Limited Stage Small Cell Carcinoma of the LUL. Dr. Lisbeth Renshaw discusses the pathology findings and reviews the nature of small cell and limited stage disease. Dr. Lisbeth Renshaw recommends the patient proceed with his upcoming MRI of the brain to rule out metastatic disease. We discussed the risks, benefits, short, and long term effects of radiotherapy, but given his prior treatment, we will ask our physics team to fuse in his prior treatment plans with his most recent PET scan. This will help Dr. Lisbeth Renshaw determine the ability to administer additional radiotherapy. Dr. Lisbeth Renshaw discusses the delivery and logistics of radiotherapy and  that he may be able to offer radiotherapy that allows for local control, versus palliative intent.  The patient is interested in proceeding if at all feesible and we will finalize intent on his consent. Written consent is obtained and placed in the chart, a copy will be provided once Dr. Lisbeth Renshaw determines his radiotherapy options. We will call him after Dr. Ida Rogue reviewed his prior treatments.  2. PCI. We did discuss the rationale for prophylactic cranial irradiation  (PCI). We will review this after conclusion of chemotherapy. If he had disease on his upcoming MRI of the brain, we would treat with definitive dose for whole brain radiation.  3. 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 These sites will also be followed in surveillance along with #1. 4. Left Chest wall pain. The patient was found this year on his October 2022 CT scan to have fractures of the left 3rd and 4th ribs. This will be followed expectantly but has been a risk associated with prior radiotherapy. He has previously used Neurontin for his symptoms but his symptoms seem to have lessened and he is no longer taking Neurontin.. 5. Left renal cell carcinoma. The patient will continue to follow up with Dr. [name] and Dr. Kathlene Cote.   In a visit lasting 45 minutes, greater than 50% of the time was spent face to face discussing the patient's condition, in preparation for the discussion, and coordinating the patient's care.     Carola Rhine, PAC

## 2021-02-16 NOTE — Telephone Encounter (Signed)
Spoke w/ patient's sister Oumar Marcott (verified w/ 2 identifiers), in regards to a friendly appointment reminder for Mr. Chirico's 2:00pm "RECON" appointment today 02/16/21 w/ Shona Simpson PA-C. Eilan Mcinerny verbalized understanding.

## 2021-02-17 ENCOUNTER — Inpatient Hospital Stay: Payer: Medicare Other

## 2021-02-17 ENCOUNTER — Ambulatory Visit: Payer: Medicare Other

## 2021-02-17 VITALS — BP 142/63 | HR 85 | Temp 98.3°F | Resp 20

## 2021-02-17 DIAGNOSIS — C3412 Malignant neoplasm of upper lobe, left bronchus or lung: Secondary | ICD-10-CM

## 2021-02-17 DIAGNOSIS — Z5189 Encounter for other specified aftercare: Secondary | ICD-10-CM | POA: Diagnosis not present

## 2021-02-17 DIAGNOSIS — Z5111 Encounter for antineoplastic chemotherapy: Secondary | ICD-10-CM | POA: Diagnosis not present

## 2021-02-17 DIAGNOSIS — C3492 Malignant neoplasm of unspecified part of left bronchus or lung: Secondary | ICD-10-CM

## 2021-02-17 LAB — CBC WITH DIFFERENTIAL (CANCER CENTER ONLY)
Abs Immature Granulocytes: 0.03 10*3/uL (ref 0.00–0.07)
Basophils Absolute: 0.1 10*3/uL (ref 0.0–0.1)
Basophils Relative: 1 %
Eosinophils Absolute: 0.2 10*3/uL (ref 0.0–0.5)
Eosinophils Relative: 3 %
HCT: 41.1 % (ref 39.0–52.0)
Hemoglobin: 13.6 g/dL (ref 13.0–17.0)
Immature Granulocytes: 1 %
Lymphocytes Relative: 19 %
Lymphs Abs: 1.3 10*3/uL (ref 0.7–4.0)
MCH: 28 pg (ref 26.0–34.0)
MCHC: 33.1 g/dL (ref 30.0–36.0)
MCV: 84.6 fL (ref 80.0–100.0)
Monocytes Absolute: 0.6 10*3/uL (ref 0.1–1.0)
Monocytes Relative: 9 %
Neutro Abs: 4.5 10*3/uL (ref 1.7–7.7)
Neutrophils Relative %: 67 %
Platelet Count: 268 10*3/uL (ref 150–400)
RBC: 4.86 MIL/uL (ref 4.22–5.81)
RDW: 16.4 % — ABNORMAL HIGH (ref 11.5–15.5)
WBC Count: 6.6 10*3/uL (ref 4.0–10.5)
nRBC: 0 % (ref 0.0–0.2)

## 2021-02-17 LAB — CMP (CANCER CENTER ONLY)
ALT: 9 U/L (ref 0–44)
AST: 11 U/L — ABNORMAL LOW (ref 15–41)
Albumin: 3.1 g/dL — ABNORMAL LOW (ref 3.5–5.0)
Alkaline Phosphatase: 89 U/L (ref 38–126)
Anion gap: 6 (ref 5–15)
BUN: 17 mg/dL (ref 8–23)
CO2: 22 mmol/L (ref 22–32)
Calcium: 8.8 mg/dL — ABNORMAL LOW (ref 8.9–10.3)
Chloride: 108 mmol/L (ref 98–111)
Creatinine: 1.09 mg/dL (ref 0.61–1.24)
GFR, Estimated: >60 mL/min (ref >60)
Glucose, Bld: 109 mg/dL — ABNORMAL HIGH (ref 70–99)
Potassium: 4.4 mmol/L (ref 3.5–5.1)
Sodium: 136 mmol/L (ref 135–145)
Total Bilirubin: 0.5 mg/dL (ref 0.3–1.2)
Total Protein: 7.2 g/dL (ref 6.5–8.1)

## 2021-02-17 MED ORDER — PALONOSETRON HCL INJECTION 0.25 MG/5ML
0.2500 mg | Freq: Once | INTRAVENOUS | Status: AC
  Administered 2021-02-17: 14:00:00 0.25 mg via INTRAVENOUS
  Filled 2021-02-17: qty 5

## 2021-02-17 MED ORDER — SODIUM CHLORIDE 0.9 % IV SOLN
750.0000 mg | Freq: Once | INTRAVENOUS | Status: AC
  Administered 2021-02-17: 15:00:00 750 mg via INTRAVENOUS
  Filled 2021-02-17: qty 75

## 2021-02-17 MED ORDER — SODIUM CHLORIDE 0.9 % IV SOLN: Freq: Once | INTRAVENOUS | Status: AC

## 2021-02-17 MED ORDER — SODIUM CHLORIDE 0.9 % IV SOLN
150.0000 mg | Freq: Once | INTRAVENOUS | Status: AC
  Administered 2021-02-17: 14:00:00 150 mg via INTRAVENOUS
  Filled 2021-02-17: qty 150

## 2021-02-17 MED ORDER — SODIUM CHLORIDE 0.9 % IV SOLN
100.0000 mg/m2 | Freq: Once | INTRAVENOUS | Status: AC
  Administered 2021-02-17: 16:00:00 270 mg via INTRAVENOUS
  Filled 2021-02-17: qty 13.5

## 2021-02-17 MED ORDER — SODIUM CHLORIDE 0.9 % IV SOLN
10.0000 mg | Freq: Once | INTRAVENOUS | Status: AC
  Administered 2021-02-17: 14:00:00 10 mg via INTRAVENOUS
  Filled 2021-02-17: qty 10

## 2021-02-17 NOTE — Patient Instructions (Signed)
[facility]  Discharge Instructions: Thank you for choosing [facility] to provide your oncology and hematology care.   If you have a lab appointment with the [facility], please go directly to the [facility] and check in at the registration area.   Wear comfortable clothing and clothing appropriate for easy access to any Portacath or PICC line.   We strive to give you quality time with your provider. You may need to reschedule your appointment if you arrive late (15 or more minutes).  Arriving late affects you and other patients whose appointments are after yours.  Also, if you miss three or more appointments without notifying the office, you may be dismissed from the clinic at the providers discretion.      For prescription refill requests, have your pharmacy contact our office and allow 72 hours for refills to be completed.    Today you received the following chemotherapy and/or immunotherapy agents Carboplatin and etoposide      To help prevent nausea and vomiting after your treatment, we encourage you to take your nausea medication as directed.  BELOW ARE SYMPTOMS THAT SHOULD BE REPORTED IMMEDIATELY: *FEVER GREATER THAN 100.4 F (38 C) OR HIGHER *CHILLS OR SWEATING *NAUSEA AND VOMITING THAT IS NOT CONTROLLED WITH YOUR NAUSEA MEDICATION *UNUSUAL SHORTNESS OF BREATH *UNUSUAL BRUISING OR BLEEDING *URINARY PROBLEMS (pain or burning when urinating, or frequent urination) *BOWEL PROBLEMS (unusual diarrhea, constipation, pain near the anus) TENDERNESS IN MOUTH AND THROAT WITH OR WITHOUT PRESENCE OF ULCERS (sore throat, sores in mouth, or a toothache) UNUSUAL RASH, SWELLING OR PAIN  UNUSUAL VAGINAL DISCHARGE OR ITCHING   Items with * indicate a potential emergency and should be followed up as soon as possible or go to the Emergency Department if any problems should occur.  Please show the CHEMOTHERAPY ALERT CARD or IMMUNOTHERAPY ALERT CARD  at check-in to the Emergency Department and triage nurse.  Should you have questions after your visit or need to cancel or reschedule your appointment, please contact [facility]  Dept: [phone]  and follow the prompts.  Office hours are 8:00 a.m. to 4:30 p.m. Monday - Friday. Please note that voicemails left after 4:00 p.m. may not be returned until the following business day.  We are closed weekends and major holidays. You have access to a nurse at all times for urgent questions. Please call the main number to the clinic Dept: [phone] and follow the prompts.   For any non-urgent questions, you may also contact your provider using MyChart. We now offer e-Visits for anyone 2 and older to request care online for non-urgent symptoms. For details visit [email or web address].   Also download the MyChart app! Go to the app store, search "MyChart", open the app, select [facility], and log in with your MyChart username and password.  Due to Covid, a mask is required upon entering the hospital/clinic. If you do not have a mask, one will be given to you upon arrival. For doctor visits, patients may have 1 support person aged 68 or older with them. For treatment visits, patients cannot have anyone with them due to current Covid guidelines and our immunocompromised population.

## 2021-02-18 ENCOUNTER — Inpatient Hospital Stay: Payer: Medicare Other

## 2021-02-18 ENCOUNTER — Other Ambulatory Visit: Payer: Self-pay

## 2021-02-18 ENCOUNTER — Ambulatory Visit: Payer: Medicare Other

## 2021-02-18 ENCOUNTER — Encounter: Payer: Self-pay | Admitting: General Practice

## 2021-02-18 ENCOUNTER — Inpatient Hospital Stay: Payer: Medicare Other | Admitting: General Practice

## 2021-02-18 ENCOUNTER — Ambulatory Visit (HOSPITAL_COMMUNITY)
Admission: RE | Admit: 2021-02-18 | Discharge: 2021-02-18 | Disposition: A | Discharge status: Home / Self Care | Payer: Medicare Other | Source: Ambulatory Visit | Attending: Pulmonary Disease | Admitting: Pulmonary Disease

## 2021-02-18 VITALS — BP 155/50 | HR 94 | Temp 98.6°F | Resp 20

## 2021-02-18 DIAGNOSIS — C3490 Malignant neoplasm of unspecified part of unspecified bronchus or lung: Secondary | ICD-10-CM | POA: Insufficient documentation

## 2021-02-18 DIAGNOSIS — R220 Localized swelling, mass and lump, head: Secondary | ICD-10-CM | POA: Diagnosis not present

## 2021-02-18 DIAGNOSIS — C3492 Malignant neoplasm of unspecified part of left bronchus or lung: Secondary | ICD-10-CM

## 2021-02-18 DIAGNOSIS — R519 Headache, unspecified: Secondary | ICD-10-CM | POA: Diagnosis not present

## 2021-02-18 MED ORDER — SODIUM CHLORIDE 0.9 % IV SOLN: Freq: Once | INTRAVENOUS | Status: AC

## 2021-02-18 MED ORDER — GADOBUTROL 1 MMOL/ML IV SOLN
10.0000 mL | Freq: Once | INTRAVENOUS | Status: AC | PRN
  Administered 2021-02-18: 10 mL via INTRAVENOUS

## 2021-02-18 MED ORDER — SODIUM CHLORIDE 0.9 % IV SOLN
10.0000 mg | Freq: Once | INTRAVENOUS | Status: AC
  Administered 2021-02-18: 10 mg via INTRAVENOUS
  Filled 2021-02-18: qty 10

## 2021-02-18 MED ORDER — SODIUM CHLORIDE 0.9 % IV SOLN
100.0000 mg/m2 | Freq: Once | INTRAVENOUS | Status: AC
  Administered 2021-02-18: 270 mg via INTRAVENOUS
  Filled 2021-02-18: qty 13.5

## 2021-02-18 MED FILL — Dexamethasone Sodium Phosphate Inj 100 MG/10ML: INTRAMUSCULAR | Qty: 1 | Status: AC

## 2021-02-18 NOTE — Progress Notes (Signed)
[facility] CSW Progress Notes  Fourth and final disbursement from [employer] given to patient today in infusion.  He has no more funds available through this fund.  He is aware of this.   Edwyna Shell, LCSW Clinical Social Worker Phone:  [phone]

## 2021-02-18 NOTE — Patient Instructions (Signed)
[facility]  Discharge Instructions: Thank you for choosing [facility] to provide your oncology and hematology care.   If you have a lab appointment with the [facility], please go directly to the [facility] and check in at the registration area.   Wear comfortable clothing and clothing appropriate for easy access to any Portacath or PICC line.   We strive to give you quality time with your provider. You may need to reschedule your appointment if you arrive late (15 or more minutes).  Arriving late affects you and other patients whose appointments are after yours.  Also, if you miss three or more appointments without notifying the office, you may be dismissed from the clinic at the providers discretion.      For prescription refill requests, have your pharmacy contact our office and allow 72 hours for refills to be completed.    Today you received the following chemotherapy and/or immunotherapy agents Carboplatin and etoposide      To help prevent nausea and vomiting after your treatment, we encourage you to take your nausea medication as directed.  BELOW ARE SYMPTOMS THAT SHOULD BE REPORTED IMMEDIATELY: *FEVER GREATER THAN 100.4 F (38 C) OR HIGHER *CHILLS OR SWEATING *NAUSEA AND VOMITING THAT IS NOT CONTROLLED WITH YOUR NAUSEA MEDICATION *UNUSUAL SHORTNESS OF BREATH *UNUSUAL BRUISING OR BLEEDING *URINARY PROBLEMS (pain or burning when urinating, or frequent urination) *BOWEL PROBLEMS (unusual diarrhea, constipation, pain near the anus) TENDERNESS IN MOUTH AND THROAT WITH OR WITHOUT PRESENCE OF ULCERS (sore throat, sores in mouth, or a toothache) UNUSUAL RASH, SWELLING OR PAIN  UNUSUAL VAGINAL DISCHARGE OR ITCHING   Items with * indicate a potential emergency and should be followed up as soon as possible or go to the Emergency Department if any problems should occur.  Please show the CHEMOTHERAPY ALERT CARD or IMMUNOTHERAPY ALERT CARD  at check-in to the Emergency Department and triage nurse.  Should you have questions after your visit or need to cancel or reschedule your appointment, please contact [facility]  Dept: [phone]  and follow the prompts.  Office hours are 8:00 a.m. to 4:30 p.m. Monday - Friday. Please note that voicemails left after 4:00 p.m. may not be returned until the following business day.  We are closed weekends and major holidays. You have access to a nurse at all times for urgent questions. Please call the main number to the clinic Dept: [phone] and follow the prompts.   For any non-urgent questions, you may also contact your provider using MyChart. We now offer e-Visits for anyone 68 and older to request care online for non-urgent symptoms. For details visit [email or web address].   Also download the MyChart app! Go to the app store, search "MyChart", open the app, select [facility], and log in with your MyChart username and password.  Due to Covid, a mask is required upon entering the hospital/clinic. If you do not have a mask, one will be given to you upon arrival. For doctor visits, patients may have 1 support person aged 26 or older with them. For treatment visits, patients cannot have anyone with them due to current Covid guidelines and our immunocompromised population.

## 2021-02-18 NOTE — Progress Notes (Signed)
Reginald Berry Spiritual Care Note  Met Reginald Reginald Berry's sister Reginald Berry yesterday in the lobby. Introduced Reginald Berry to Reginald Berry in infusion today per her referral. Reginald Berry reports that he is feeling good overall, which understandably helps his morale. He has my card and brochure, and I plan to follow up at a future treatment.   [place], [place], [facility] Pager [phone] Voicemail [phone]

## 2021-02-19 ENCOUNTER — Inpatient Hospital Stay: Payer: Medicare Other

## 2021-02-19 VITALS — BP 147/55 | HR 78 | Temp 97.8°F | Resp 20

## 2021-02-19 DIAGNOSIS — C3412 Malignant neoplasm of upper lobe, left bronchus or lung: Secondary | ICD-10-CM | POA: Diagnosis not present

## 2021-02-19 DIAGNOSIS — Z5111 Encounter for antineoplastic chemotherapy: Secondary | ICD-10-CM | POA: Diagnosis not present

## 2021-02-19 DIAGNOSIS — Z5189 Encounter for other specified aftercare: Secondary | ICD-10-CM | POA: Diagnosis not present

## 2021-02-19 DIAGNOSIS — C3492 Malignant neoplasm of unspecified part of left bronchus or lung: Secondary | ICD-10-CM

## 2021-02-19 MED ORDER — SODIUM CHLORIDE 0.9 % IV SOLN: Freq: Once | INTRAVENOUS | Status: AC

## 2021-02-19 MED ORDER — SODIUM CHLORIDE 0.9 % IV SOLN
10.0000 mg | Freq: Once | INTRAVENOUS | Status: AC
  Administered 2021-02-19: 10 mg via INTRAVENOUS
  Filled 2021-02-19: qty 10

## 2021-02-19 MED ORDER — SODIUM CHLORIDE 0.9 % IV SOLN
100.0000 mg/m2 | Freq: Once | INTRAVENOUS | Status: AC
  Administered 2021-02-19: 270 mg via INTRAVENOUS
  Filled 2021-02-19: qty 13.5

## 2021-02-19 NOTE — Patient Instructions (Signed)
[facility]  Discharge Instructions: Thank you for choosing [facility] to provide your oncology and hematology care.   If you have a lab appointment with the [facility], please go directly to the [facility] and check in at the registration area.   Wear comfortable clothing and clothing appropriate for easy access to any Portacath or PICC line.   We strive to give you quality time with your provider. You may need to reschedule your appointment if you arrive late (15 or more minutes).  Arriving late affects you and other patients whose appointments are after yours.  Also, if you miss three or more appointments without notifying the office, you may be dismissed from the clinic at the providers discretion.      For prescription refill requests, have your pharmacy contact our office and allow 72 hours for refills to be completed.    Today you received the following chemotherapy and/or immunotherapy agents: Etoposide     To help prevent nausea and vomiting after your treatment, we encourage you to take your nausea medication as directed.  BELOW ARE SYMPTOMS THAT SHOULD BE REPORTED IMMEDIATELY: *FEVER GREATER THAN 100.4 F (38 C) OR HIGHER *CHILLS OR SWEATING *NAUSEA AND VOMITING THAT IS NOT CONTROLLED WITH YOUR NAUSEA MEDICATION *UNUSUAL SHORTNESS OF BREATH *UNUSUAL BRUISING OR BLEEDING *URINARY PROBLEMS (pain or burning when urinating, or frequent urination) *BOWEL PROBLEMS (unusual diarrhea, constipation, pain near the anus) TENDERNESS IN MOUTH AND THROAT WITH OR WITHOUT PRESENCE OF ULCERS (sore throat, sores in mouth, or a toothache) UNUSUAL RASH, SWELLING OR PAIN  UNUSUAL VAGINAL DISCHARGE OR ITCHING   Items with * indicate a potential emergency and should be followed up as soon as possible or go to the Emergency Department if any problems should occur.  Please show the CHEMOTHERAPY ALERT CARD or IMMUNOTHERAPY ALERT CARD at check-in to  the Emergency Department and triage nurse.  Should you have questions after your visit or need to cancel or reschedule your appointment, please contact [facility]  Dept: [phone]  and follow the prompts.  Office hours are 8:00 a.m. to 4:30 p.m. Monday - Friday. Please note that voicemails left after 4:00 p.m. may not be returned until the following business day.  We are closed weekends and major holidays. You have access to a nurse at all times for urgent questions. Please call the main number to the clinic Dept: [phone] and follow the prompts.   For any non-urgent questions, you may also contact your provider using MyChart. We now offer e-Visits for anyone 64 and older to request care online for non-urgent symptoms. For details visit [email or web address].   Also download the MyChart app! Go to the app store, search "MyChart", open the app, select [facility], and log in with your MyChart username and password.  Due to Covid, a mask is required upon entering the hospital/clinic. If you do not have a mask, one will be given to you upon arrival. For doctor visits, patients may have 1 support person aged 1 or older with them. For treatment visits, patients cannot have anyone with them due to current Covid guidelines and our immunocompromised population.

## 2021-02-20 ENCOUNTER — Ambulatory Visit: Payer: Medicare Other

## 2021-02-22 ENCOUNTER — Inpatient Hospital Stay: Payer: Medicare Other

## 2021-02-22 ENCOUNTER — Other Ambulatory Visit: Payer: Self-pay

## 2021-02-22 VITALS — BP 142/60 | HR 79 | Temp 97.7°F | Resp 20

## 2021-02-22 DIAGNOSIS — C3412 Malignant neoplasm of upper lobe, left bronchus or lung: Secondary | ICD-10-CM

## 2021-02-22 DIAGNOSIS — Z5189 Encounter for other specified aftercare: Secondary | ICD-10-CM | POA: Diagnosis not present

## 2021-02-22 DIAGNOSIS — C3492 Malignant neoplasm of unspecified part of left bronchus or lung: Secondary | ICD-10-CM

## 2021-02-22 DIAGNOSIS — Z5111 Encounter for antineoplastic chemotherapy: Secondary | ICD-10-CM | POA: Diagnosis not present

## 2021-02-22 LAB — CBC WITH DIFFERENTIAL (CANCER CENTER ONLY)
Abs Immature Granulocytes: 0.05 10*3/uL (ref 0.00–0.07)
Basophils Absolute: 0.1 10*3/uL (ref 0.0–0.1)
Basophils Relative: 1 %
Eosinophils Absolute: 0.2 10*3/uL (ref 0.0–0.5)
Eosinophils Relative: 3 %
HCT: 40.4 % (ref 39.0–52.0)
Hemoglobin: 13.5 g/dL (ref 13.0–17.0)
Immature Granulocytes: 1 %
Lymphocytes Relative: 18 %
Lymphs Abs: 1.4 10*3/uL (ref 0.7–4.0)
MCH: 28.1 pg (ref 26.0–34.0)
MCHC: 33.4 g/dL (ref 30.0–36.0)
MCV: 84 fL (ref 80.0–100.0)
Monocytes Absolute: 0.1 10*3/uL (ref 0.1–1.0)
Monocytes Relative: 1 %
Neutro Abs: 6.1 10*3/uL (ref 1.7–7.7)
Neutrophils Relative %: 76 %
Platelet Count: 280 10*3/uL (ref 150–400)
RBC: 4.81 MIL/uL (ref 4.22–5.81)
RDW: 15.9 % — ABNORMAL HIGH (ref 11.5–15.5)
WBC Count: 7.9 10*3/uL (ref 4.0–10.5)
nRBC: 0 % (ref 0.0–0.2)

## 2021-02-22 LAB — CMP (CANCER CENTER ONLY)
ALT: 15 U/L (ref 0–44)
AST: 14 U/L — ABNORMAL LOW (ref 15–41)
Albumin: 3.1 g/dL — ABNORMAL LOW (ref 3.5–5.0)
Alkaline Phosphatase: 88 U/L (ref 38–126)
Anion gap: 9 (ref 5–15)
BUN: 32 mg/dL — ABNORMAL HIGH (ref 8–23)
CO2: 21 mmol/L — ABNORMAL LOW (ref 22–32)
Calcium: 8.2 mg/dL — ABNORMAL LOW (ref 8.9–10.3)
Chloride: 105 mmol/L (ref 98–111)
Creatinine: 1.03 mg/dL (ref 0.61–1.24)
GFR, Estimated: >60 mL/min (ref >60)
Glucose, Bld: 117 mg/dL — ABNORMAL HIGH (ref 70–99)
Potassium: 4.4 mmol/L (ref 3.5–5.1)
Sodium: 135 mmol/L (ref 135–145)
Total Bilirubin: 0.6 mg/dL (ref 0.3–1.2)
Total Protein: 6.7 g/dL (ref 6.5–8.1)

## 2021-02-22 MED ORDER — PEGFILGRASTIM-BMEZ 6 MG/0.6ML ~~LOC~~ SOSY
6.0000 mg | PREFILLED_SYRINGE | Freq: Once | SUBCUTANEOUS | Status: AC
  Administered 2021-02-22: 10:00:00 6 mg via SUBCUTANEOUS
  Filled 2021-02-22: qty 0.6

## 2021-02-22 NOTE — Patient Instructions (Signed)

## 2021-02-25 ENCOUNTER — Telehealth: Payer: Self-pay | Admitting: Radiation Oncology

## 2021-02-25 ENCOUNTER — Other Ambulatory Visit: Payer: Medicare Other

## 2021-02-25 ENCOUNTER — Other Ambulatory Visit: Payer: Self-pay

## 2021-02-25 ENCOUNTER — Other Ambulatory Visit: Payer: Self-pay | Admitting: Radiation Oncology

## 2021-02-25 ENCOUNTER — Inpatient Hospital Stay (HOSPITAL_BASED_OUTPATIENT_CLINIC_OR_DEPARTMENT_OTHER): Payer: Medicare Other | Admitting: Internal Medicine

## 2021-02-25 VITALS — BP 118/66 | HR 78 | Temp 98.0°F | Resp 20 | Ht 74.0 in

## 2021-02-25 DIAGNOSIS — Z5111 Encounter for antineoplastic chemotherapy: Secondary | ICD-10-CM | POA: Diagnosis not present

## 2021-02-25 DIAGNOSIS — Z5189 Encounter for other specified aftercare: Secondary | ICD-10-CM | POA: Diagnosis not present

## 2021-02-25 DIAGNOSIS — C3412 Malignant neoplasm of upper lobe, left bronchus or lung: Secondary | ICD-10-CM | POA: Diagnosis not present

## 2021-02-25 NOTE — Telephone Encounter (Signed)
I called and spoke with the patient's sister [name] who is an active participant in his medical care. We discussed the patient's prior treatment plans do overlap with the limited stage small cell lung cancer he now has. Dr. Lisbeth Renshaw is in favor of an additional cycle of carboplatin/etoposide chemotherapy prior to proceeding with chemoRT. We would determine his course based on his disease by simulation (no diagnostic CT would be anticipated), but hopefully his disease will have pulled away a bit from where the overlapping sites from prior therapy would be located and then maximize the dose as much as possible to his current disease. Given this plan, we will cancel his simulation, and reschedule this for 03/16/21 and anticipate starting with his next cycle of chemotherapy on 03/29/21. We will notify medical oncology of our plans as well.

## 2021-02-25 NOTE — Progress Notes (Signed)
[facility] [phone]   [phone]  OFFICE PROGRESS NOTE  [name], MD [place] [address]  DIAGNOSIS:  1) Limited stage (T3, N0, M0) small cell lung cancer diagnosed in November 2022. 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  Diagnosed in 2019. 3) stage Ia3 (T1cN0M0) non-small cell lung cancer.  Diagnosed with a left suprahilar region with AP window adenopathy.  Status post SBRT 4) renal cell carcinoma status post CT-guided ablation in July 2020.  Followed by Dr. [name]. 5) recurrent non-small cell lung cancer in July 2021.  Patient presented with new adenopathy in the AP window. Inaccessible to biopsy   PRIOR THERAPY:  1) SBRT to the LUL nodule in 2019 under the care of Dr. Lisbeth Renshaw 2) SBRT to the left suprahilar mass under the care of Dr. Lisbeth Renshaw in March 2020 3) CT guided ablation to the renal cell carcinoma under the care of Dr. Kathlene Cote. 40 Concurrent chemoradiation with carboplatin for an AUC of 2 and paclitaxel 45 mg/m.  First dose on 09/30/2019.  Status post 6 cycles.  Last cycle was given on 11/12/2019 with partial response. 4) Consolidation treatment with immunotherapy with Imfinzi 1500 mg IV every 4 weeks.  First dose December 18, 2019.  Status post 13 cycles.     CURRENT THERAPY: Systemic chemotherapy with carboplatin for AUC of 5 on day 1 and 2 etoposide 100 Mg/M2 on days 1, 2 and 3 concurrent with radiotherapy.  First dose February 17, 2021.  INTERVAL HISTORY: Reginald Berry 70 y.o. male returns to the clinic today for follow-up visit accompanied by his sister.  The patient is feeling fine today with no concerning complaints except for mild fatigue.  He tolerated the first dose of his treatment with carboplatin and etoposide fairly well.  He denied having any chest pain, shortness of breath except with exertion with mild cough and no hemoptysis.  He denied  having any fever or chills.  He has no nausea, vomiting, diarrhea or constipation.  He has no headache or visual changes.  He is here today for evaluation after his first cycle of the treatment.   MEDICAL HISTORY: Past Medical History:  Diagnosis Date   Anxiety    Aortic atherosclerosis (HCC)    Arthritis    Chronic low back pain    COPD (chronic obstructive pulmonary disease) (HCC)    Coronary artery calcification seen on CT scan    Multivessel   Depression    Essential hypertension    Gait instability    Previous logging accident   GERD (gastroesophageal reflux disease)    Lung cancer ([facility])    Poorly differentiated, XRT   Renal cell carcinoma ([facility])    Left, status post cryoablation August 2020    ALLERGIES:  has No Known Allergies.  MEDICATIONS:  Current Outpatient Medications  Medication Sig Dispense Refill   albuterol (PROVENTIL) (2.5 MG/3ML) 0.083% nebulizer solution Take 3 mLs (2.5 mg total) by nebulization every 6 (six) hours as needed for wheezing or shortness of breath. 360 mL 11   ALPRAZolam (XANAX) 0.5 MG tablet Take 0.5 mg by mouth at bedtime.     amLODipine (NORVASC) 10 MG tablet Take 10 mg by mouth daily.     aspirin 81 MG EC tablet Take 81 mg by mouth daily. Swallow whole.     buPROPion (WELLBUTRIN XL) 300 MG 24 hr tablet Take  300 mg by mouth daily.     busPIRone (BUSPAR) 15 MG tablet Take 15 mg by mouth daily.     cetirizine (ZYRTEC) 10 MG tablet Take 10 mg by mouth daily.     Fluticasone-Umeclidin-Vilant (TRELEGY ELLIPTA) 100-62.5-25 MCG/ACT AEPB Inhale 1 puff into the lungs daily. 60 each 0   Fluticasone-Umeclidin-Vilant (TRELEGY ELLIPTA) 100-62.5-25 MCG/ACT AEPB Inhale 1 puff into the lungs daily. 60 each 3   furosemide (LASIX) 40 MG tablet Take 40 mg by mouth daily.      hydrochlorothiazide (MICROZIDE) 12.5 MG capsule Take 12.5 mg by mouth daily.     liothyronine (CYTOMEL) 25 MCG tablet Take 25 mcg by mouth daily.     losartan (COZAAR) 100 MG tablet Take  100 mg by mouth daily.     Multiple Vitamins-Minerals (CENTRUM SILVER 50+MEN) TABS Take 1 tablet by mouth daily.     oxyCODONE-acetaminophen (PERCOCET) 10-325 MG tablet Take 1 tablet by mouth 4 (four) times daily as needed for pain.     pantoprazole (PROTONIX) 40 MG tablet Take 40 mg by mouth every evening.      Probiotic Product (PROBIOTIC DAILY PO) Take 1 capsule by mouth daily.     prochlorperazine (COMPAZINE) 10 MG tablet TAKE ONE TABLET EVERY 6 HOURS AS NEEDED 30 tablet 2   prochlorperazine (COMPAZINE) 10 MG tablet Take 1 tablet (10 mg total) by mouth every 6 (six) hours as needed for nausea or vomiting. 30 tablet 0   rosuvastatin (CRESTOR) 5 MG tablet Take 5 mg by mouth at bedtime.     No current facility-administered medications for this visit.    SURGICAL HISTORY:  Past Surgical History:  Procedure Laterality Date   BRONCHIAL BIOPSY  01/27/2021   Procedure: BRONCHIAL BIOPSIES;  Surgeon: Garner Nash, DO;  Location: [facility];  Service: Pulmonary;;   BRONCHIAL BRUSHINGS  01/27/2021   Procedure: BRONCHIAL BRUSHINGS;  Surgeon: Garner Nash, DO;  Location: [facility];  Service: Pulmonary;;   BRONCHIAL NEEDLE ASPIRATION BIOPSY  01/27/2021   Procedure: BRONCHIAL NEEDLE ASPIRATION BIOPSIES;  Surgeon: Garner Nash, DO;  Location: [facility];  Service: Pulmonary;;   CHOLECYSTECTOMY     2019   FRACTURE SURGERY     bil legs (logging trees)   IR RADIOLOGIST EVAL & MGMT  09/27/2018   IR RADIOLOGIST EVAL & MGMT  11/22/2018   IR RADIOLOGIST EVAL & MGMT  04/10/2019   IR RADIOLOGIST EVAL & MGMT  10/13/2020   NECK SURGERY  1994-1995   RADIOLOGY WITH ANESTHESIA Left 10/31/2018   Procedure: CT WITH ANESTHESIA RENAL CRYO ABLATION;  Surgeon: Aletta Edouard, MD;  Location: [facility];  Service: Radiology;  Laterality: Left;   VIDEO BRONCHOSCOPY WITH ENDOBRONCHIAL ULTRASOUND Bilateral 01/27/2021   Procedure: VIDEO BRONCHOSCOPY WITH ENDOBRONCHIAL ULTRASOUND;  Surgeon: Garner Nash, DO;   Location: [facility];  Service: Pulmonary;  Laterality: Bilateral;   VIDEO BRONCHOSCOPY WITH RADIAL ENDOBRONCHIAL ULTRASOUND  01/27/2021   Procedure: VIDEO BRONCHOSCOPY WITH RADIAL ENDOBRONCHIAL ULTRASOUND;  Surgeon: Garner Nash, DO;  Location: [facility];  Service: Pulmonary;;    REVIEW OF SYSTEMS:  A comprehensive review of systems was negative except for: Constitutional: positive for fatigue Respiratory: positive for cough and dyspnea on exertion   PHYSICAL EXAMINATION: General appearance: alert, cooperative, fatigued, and no distress Head: Normocephalic, without obvious abnormality, atraumatic Neck: no adenopathy, no JVD, supple, symmetrical, trachea midline, and thyroid not enlarged, symmetric, no tenderness/mass/nodules Lymph nodes: Cervical, supraclavicular, and axillary nodes normal. Resp: clear to auscultation bilaterally Back:  symmetric, no curvature. ROM normal. No CVA tenderness. Cardio: regular rate and rhythm, S1, S2 normal, no murmur, click, rub or gallop GI: soft, non-tender; bowel sounds normal; no masses,  no organomegaly Extremities: extremities normal, atraumatic, no cyanosis or edema  ECOG PERFORMANCE STATUS: 1 - Symptomatic but completely ambulatory  Blood pressure 118/66, pulse 78, temperature 98 F (36.7 C), temperature source Oral, resp. rate 20, height 6\' 2"  (1.88 m), SpO2 100 %.  LABORATORY DATA: Lab Results  Component Value Date   WBC 7.9 02/22/2021   HGB 13.5 02/22/2021   HCT 40.4 02/22/2021   MCV 84.0 02/22/2021   PLT 280 02/22/2021      Chemistry      Component Value Date/Time   NA 135 02/22/2021 0953   K 4.4 02/22/2021 0953   CL 105 02/22/2021 0953   CO2 21 (L) 02/22/2021 0953   BUN 32 (H) 02/22/2021 0953   CREATININE 1.03 02/22/2021 0953      Component Value Date/Time   CALCIUM 8.2 (L) 02/22/2021 0953   ALKPHOS 88 02/22/2021 0953   AST 14 (L) 02/22/2021 0953   ALT 15 02/22/2021 0953   BILITOT 0.6 02/22/2021 0953        RADIOGRAPHIC STUDIES: MR BRAIN W WO CONTRAST  Result Date: 02/20/2021 CLINICAL DATA:  Small cell lung cancer. Staging. EXAM: MRI HEAD WITHOUT AND WITH CONTRAST TECHNIQUE: Multiplanar, multiecho pulse sequences of the brain and surrounding structures were obtained without and with intravenous contrast. CONTRAST:  28mL GADAVIST GADOBUTROL 1 MMOL/ML IV SOLN COMPARISON:  None. FINDINGS: Brain: No acute infarct, hemorrhage, or mass lesion is present. The ventricles are of normal size. No significant extraaxial fluid collection is present. Periventricular T2 hyperintensities are mildly advanced for age. No other significant white matter disease is present. The ventricles are of normal size. No significant extraaxial fluid collection is present. The internal auditory canals are within normal limits. The brainstem and cerebellum are within normal limits. Vascular: Flow is present in the major intracranial arteries. Skull and upper cervical spine: Disc disease at C2-3 and C3-4 is well is a narrow canal result in some degree of central canal stenosis. Craniocervical junction is within normal limits. Marrow signal is unremarkable. No focal lesions are present. Sinuses/Orbits: A left mastoid effusion is present. Asymmetric enhancement is present in left nasopharynx. 6 mm mass lesion may be present. A separate Tornwaldt cyst is noted. The paranasal sinuses and mastoid air cells are otherwise clear. The globes and orbits are within normal limits. IMPRESSION: 1. Left mastoid effusion with asymmetric enhancement and possible 6 mm lesion in the left nasopharynx. Consider further evaluation with CT neck with contrast and ENT consultation/examination. 2. No evidence for metastatic disease to the brain or meninges. 3. Periventricular T2 hyperintensities are mildly advanced for age. The finding is nonspecific but can be seen in the setting of chronic microvascular ischemia, a demyelinating process such as multiple sclerosis,  vasculitis, complicated migraine headaches, or as the sequelae of a prior infectious or inflammatory process. Electronically Signed   By: San Morelle M.D.   On: 02/20/2021 09:02   DG CHEST PORT 1 VIEW  Result Date: 01/27/2021 CLINICAL DATA:  Status post bronchoscopy. EXAM: PORTABLE CHEST 1 VIEW COMPARISON:  Chest x-ray dated October 26, 2018 FINDINGS: Cardiac and mediastinal contours appear increased in size when compared with prior chest x-rays. Increased density of the left upper lobe opacity, likely post bronchoscopy changes. Mild bibasilar opacities, likely due to atelectasis. No evidence of pleural effusion pneumothorax. IMPRESSION: 1.  No pleural effusion or pneumothorax status post bronchoscopy. 2. Cardiac and mediastinal contours appear enlarged when compared with prior chest x-rays, possibly artifactual and related to technique. This could be further evaluated with PA and lateral chest radiograph. Electronically Signed   By: Yetta Glassman M.D.   On: 01/27/2021 15:36   DG C-ARM BRONCHOSCOPY  Result Date: 01/27/2021 C-ARM BRONCHOSCOPY: Fluoroscopy was utilized by the requesting physician.  No radiographic interpretation.    ASSESSMENT AND PLAN: This is a very pleasant 70 years old white male with  1) recurrent non-small cell lung cancer presenting as a stage IIIa with mediastinal lymphadenopathy in July 2021.  The patient has a history of early stage non-small cell lung cancer, adenosquamous carcinoma as stage Ib of the left upper lobe status post SBRT in 2019.  He also has a stage Ia3 non-small cell lung cancer status post SBRT to the left suprahilar region and AP window adenopathy. He completed a course of concurrent chemoradiation with weekly carboplatin and paclitaxel status post 6 cycles and tolerated his treatment well. The patient completed treatment with immunotherapy with Imfinzi 1500 mg IV every 4 weeks status post 13 cycles.    2) limited stage (T3, N0, M0) small cell  lung cancer diagnosed in December 2022. He is currently undergoing a course of systemic chemotherapy with carboplatin for AUC of 5 on day 1, etoposide 100 Mg/M2 on days 1, 2 and 3 for 4 cycles.  This will be concurrent with radiotherapy.  Status post 1 cycle of treatment started on February 17, 2021. The patient tolerated the first week of his treatment fairly well with no concerning complaints except for fatigue. I recommended for him to proceed with cycle #2 as planned on March 09, 2021.  I will see him back for follow-up visit at that time. The patient was advised to call immediately if he has any other concerning symptoms in the interval.  The patient voices understanding of current disease status and treatment options and is in agreement with the current care plan. All questions were answered. The patient knows to call the clinic with any problems, questions or concerns. We can certainly see the patient much sooner if necessary.   Disclaimer: This note was dictated with voice recognition software. Similar sounding words can inadvertently be transcribed and may not be corrected upon review.

## 2021-02-26 ENCOUNTER — Ambulatory Visit: Payer: Medicare Other | Admitting: Radiation Oncology

## 2021-02-26 NOTE — Progress Notes (Signed)
[facility] OFFICE PROGRESS NOTE  [name], MD [place] [address]  DIAGNOSIS:  1) Limited stage (T3, N0, M0) small cell lung cancer diagnosed in November 2022. 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  Diagnosed in 2019. 3) stage Ia3 (T1cN0M0) non-small cell lung cancer.  Diagnosed with a left suprahilar region with AP window adenopathy.  Status post SBRT 4) renal cell carcinoma status post CT-guided ablation in July 2020.  Followed by Dr. [name]. 5) recurrent non-small cell lung cancer in July 2021.  Patient presented with new adenopathy in the AP window. Inaccessible to biopsy  PRIOR THERAPY:  1) SBRT to the LUL nodule in 2019 under the care of Dr. Lisbeth Renshaw 2) SBRT to the left suprahilar mass under the care of Dr. Lisbeth Renshaw in March 2020 3) CT guided ablation to the renal cell carcinoma under the care of Dr. Kathlene Cote. 40 Concurrent chemoradiation with carboplatin for an AUC of 2 and paclitaxel 45 mg/m.  First dose on 09/30/2019.  Status post 6 cycles.  Last cycle was given on 11/12/2019 with partial response. 4) Consolidation treatment with immunotherapy with Imfinzi 1500 mg IV every 4 weeks.  First dose December 18, 2019.  Status post 13 cycles.  CURRENT THERAPY: Systemic chemotherapy with carboplatin for AUC of 5 on day 1 and 2 etoposide 100 Mg/M2 on days 1, 2 and 3 concurrent with radiotherapy.  First dose February 17, 2021. Status post 1 cycle.   INTERVAL HISTORY: Reginald Berry 70 y.o. male returns to the clinic today for a follow-up visit accompanied by his sister.  The patient unfortunately was recently diagnosed with small cell lung cancer.  The patient is currently undergoing concurrent chemoradiation.  Radiation oncology is going to start radiation with cycle #3 of systemic chemotherapy as there is some overlapping sites of disease with his prior radiation therapy from his history of non-small  cell lung cancer.  The patient is status post 1 cycle of systemic chemotherapy and he tolerated this well without any concerning adverse side effects.  He denies any fever, chills, or night sweats. He reports he is eating well. He reports his baseline dyspnea on exertion which is unchanged.  Denies any chest pain or hemoptysis. He sometimes has heartburn and takes Rolaids and Protonix.  He has a mild cough when he is indoors but it improves when he goes outside.  Denies any nausea, vomiting, diarrhea, or constipation.  Denies any headache or visual changes.  He reports he has intermittent dysuria. The patient is here today for evaluation and repeat blood work before starting cycle #2.   MEDICAL HISTORY: Past Medical History:  Diagnosis Date   Anxiety    Aortic atherosclerosis (HCC)    Arthritis    Chronic low back pain    COPD (chronic obstructive pulmonary disease) (HCC)    Coronary artery calcification seen on CT scan    Multivessel   Depression    Essential hypertension    Gait instability    Previous logging accident   GERD (gastroesophageal reflux disease)    Lung cancer ([place])    Poorly differentiated, XRT   Renal cell carcinoma ([place])    Left, status post cryoablation August 2020    ALLERGIES:  has No Known Allergies.  MEDICATIONS:  Current Outpatient Medications  Medication Sig Dispense Refill   albuterol (PROVENTIL) (2.5 MG/3ML) 0.083% nebulizer solution Take 3 mLs (2.5 mg total) by  nebulization every 6 (six) hours as needed for wheezing or shortness of breath. 360 mL 11   ALPRAZolam (XANAX) 0.5 MG tablet Take 0.5 mg by mouth at bedtime.     amLODipine (NORVASC) 10 MG tablet Take 10 mg by mouth daily.     aspirin 81 MG EC tablet Take 81 mg by mouth daily. Swallow whole.     buPROPion (WELLBUTRIN XL) 300 MG 24 hr tablet Take 300 mg by mouth daily.     busPIRone (BUSPAR) 15 MG tablet Take 15 mg by mouth daily.     cetirizine (ZYRTEC) 10 MG tablet Take 10 mg by mouth daily.      Fluticasone-Umeclidin-Vilant (TRELEGY ELLIPTA) 100-62.5-25 MCG/ACT AEPB Inhale 1 puff into the lungs daily. 60 each 0   Fluticasone-Umeclidin-Vilant (TRELEGY ELLIPTA) 100-62.5-25 MCG/ACT AEPB Inhale 1 puff into the lungs daily. 60 each 3   furosemide (LASIX) 40 MG tablet Take 40 mg by mouth daily.      hydrochlorothiazide (MICROZIDE) 12.5 MG capsule Take 12.5 mg by mouth daily.     liothyronine (CYTOMEL) 25 MCG tablet Take 25 mcg by mouth daily.     losartan (COZAAR) 100 MG tablet Take 100 mg by mouth daily.     Multiple Vitamins-Minerals (CENTRUM SILVER 50+MEN) TABS Take 1 tablet by mouth daily.     oxyCODONE-acetaminophen (PERCOCET) 10-325 MG tablet Take 1 tablet by mouth 4 (four) times daily as needed for pain.     pantoprazole (PROTONIX) 40 MG tablet Take 40 mg by mouth every evening.      Probiotic Product (PROBIOTIC DAILY PO) Take 1 capsule by mouth daily.     prochlorperazine (COMPAZINE) 10 MG tablet TAKE ONE TABLET EVERY 6 HOURS AS NEEDED 30 tablet 2   prochlorperazine (COMPAZINE) 10 MG tablet Take 1 tablet (10 mg total) by mouth every 6 (six) hours as needed for nausea or vomiting. 30 tablet 0   rosuvastatin (CRESTOR) 5 MG tablet Take 5 mg by mouth at bedtime.     No current facility-administered medications for this visit.    SURGICAL HISTORY:  Past Surgical History:  Procedure Laterality Date   BRONCHIAL BIOPSY  01/27/2021   Procedure: BRONCHIAL BIOPSIES;  Surgeon: Garner Nash, DO;  Location: [facility];  Service: Pulmonary;;   BRONCHIAL BRUSHINGS  01/27/2021   Procedure: BRONCHIAL BRUSHINGS;  Surgeon: Garner Nash, DO;  Location: [facility];  Service: Pulmonary;;   BRONCHIAL NEEDLE ASPIRATION BIOPSY  01/27/2021   Procedure: BRONCHIAL NEEDLE ASPIRATION BIOPSIES;  Surgeon: Garner Nash, DO;  Location: [facility];  Service: Pulmonary;;   CHOLECYSTECTOMY     2019   FRACTURE SURGERY     bil legs (logging trees)   IR RADIOLOGIST EVAL & MGMT  09/27/2018   IR  RADIOLOGIST EVAL & MGMT  11/22/2018   IR RADIOLOGIST EVAL & MGMT  04/10/2019   IR RADIOLOGIST EVAL & MGMT  10/13/2020   NECK SURGERY  1994-1995   RADIOLOGY WITH ANESTHESIA Left 10/31/2018   Procedure: CT WITH ANESTHESIA RENAL CRYO ABLATION;  Surgeon: Aletta Edouard, MD;  Location: [facility];  Service: Radiology;  Laterality: Left;   VIDEO BRONCHOSCOPY WITH ENDOBRONCHIAL ULTRASOUND Bilateral 01/27/2021   Procedure: VIDEO BRONCHOSCOPY WITH ENDOBRONCHIAL ULTRASOUND;  Surgeon: Garner Nash, DO;  Location: [facility];  Service: Pulmonary;  Laterality: Bilateral;   VIDEO BRONCHOSCOPY WITH RADIAL ENDOBRONCHIAL ULTRASOUND  01/27/2021   Procedure: VIDEO BRONCHOSCOPY WITH RADIAL ENDOBRONCHIAL ULTRASOUND;  Surgeon: Garner Nash, DO;  Location: [facility];  Service: Pulmonary;;  REVIEW OF SYSTEMS:   Review of Systems  Constitutional: Negative for appetite change, chills, fatigue, fever and unexpected weight change.  HENT:   Negative for mouth sores, nosebleeds, sore throat and trouble swallowing.   Eyes: Negative for eye problems and icterus.  Respiratory: Positive for baseline dyspnea on exertion.  Mild occasional cough.  Negative for hemoptysis and wheezing.   Cardiovascular: Negative for chest pain.  Positive for baseline lower extremity swelling. Gastrointestinal: Negative for abdominal pain, constipation, diarrhea, nausea and vomiting.  Genitourinary: Positive for intermittent dysuria.  Negative for bladder incontinence, dysuria, frequency and hematuria.   Musculoskeletal: Positive for chronic neck and back pain secondary to an old injury.   Skin: Positive for multiple scabs on his extremities. Neurological: Negative for dizziness, extremity weakness, gait problem, headaches, light-headedness and seizures.  Hematological: Negative for adenopathy. Does not bruise/bleed easily.  Psychiatric/Behavioral: Negative for confusion, depression and sleep disturbance. The patient is not nervous/anxious.      PHYSICAL EXAMINATION:  Blood pressure 98/60, pulse 92, temperature 97.7 F (36.5 C), temperature source Temporal, resp. rate 18, height 6\' 2"  (1.88 m), SpO2 98 %.  ECOG PERFORMANCE STATUS: 2-3  Physical Exam  Constitutional: Oriented to person, place, and time and well-developed, well-nourished, and in no distress. No distress.  HENT:  Head: Normocephalic and atraumatic.  Mouth/Throat: Oropharynx is clear and moist. No oropharyngeal exudate.  Eyes: Conjunctivae are normal. Right eye exhibits no discharge. Left eye exhibits no discharge. No scleral icterus.  Neck: Normal range of motion. Neck supple.  Cardiovascular: Normal rate, regular rhythm, normal heart sounds and intact distal pulses.   Pulmonary/Chest: Effort normal.  Positive for quiet breath sounds bilaterally. No respiratory distress. No wheezes. No rales.  Abdominal: Soft. Bowel sounds are normal. Exhibits no distension and no mass. There is no tenderness.  Musculoskeletal: Normal range of motion. Exhibits no edema.  Lymphadenopathy:    No cervical adenopathy.  Neurological: Alert and oriented to person, place, and time. Exhibits normal muscle tone. Gait normal. Coordination normal.  Skin: Skin is warm and dry. Telangiectasias on chest  Chronic venous insufficiency in lower extremities.  Several scabs on the patient's extremities.  Not diaphoretic. No pallor.  Psychiatric: Mood, memory and judgment normal.  Vitals reviewed.  LABORATORY DATA: Lab Results  Component Value Date   WBC 10.0 03/09/2021   HGB 11.1 (L) 03/09/2021   HCT 33.3 (L) 03/09/2021   MCV 84.3 03/09/2021   PLT 377 03/09/2021      Chemistry      Component Value Date/Time   NA 136 03/09/2021 1241   K 4.3 03/09/2021 1241   CL 103 03/09/2021 1241   CO2 24 03/09/2021 1241   BUN 18 03/09/2021 1241   CREATININE 1.27 (H) 03/09/2021 1241      Component Value Date/Time   CALCIUM 8.8 (L) 03/09/2021 1241   ALKPHOS 95 03/09/2021 1241   AST 13 (L)  03/09/2021 1241   ALT 10 03/09/2021 1241   BILITOT 0.3 03/09/2021 1241       RADIOGRAPHIC STUDIES:  MR BRAIN W WO CONTRAST  Result Date: 02/20/2021 CLINICAL DATA:  Small cell lung cancer. Staging. EXAM: MRI HEAD WITHOUT AND WITH CONTRAST TECHNIQUE: Multiplanar, multiecho pulse sequences of the brain and surrounding structures were obtained without and with intravenous contrast. CONTRAST:  72mL GADAVIST GADOBUTROL 1 MMOL/ML IV SOLN COMPARISON:  None. FINDINGS: Brain: No acute infarct, hemorrhage, or mass lesion is present. The ventricles are of normal size. No significant extraaxial fluid collection is present. Periventricular  T2 hyperintensities are mildly advanced for age. No other significant white matter disease is present. The ventricles are of normal size. No significant extraaxial fluid collection is present. The internal auditory canals are within normal limits. The brainstem and cerebellum are within normal limits. Vascular: Flow is present in the major intracranial arteries. Skull and upper cervical spine: Disc disease at C2-3 and C3-4 is well is a narrow canal result in some degree of central canal stenosis. Craniocervical junction is within normal limits. Marrow signal is unremarkable. No focal lesions are present. Sinuses/Orbits: A left mastoid effusion is present. Asymmetric enhancement is present in left nasopharynx. 6 mm mass lesion may be present. A separate Tornwaldt cyst is noted. The paranasal sinuses and mastoid air cells are otherwise clear. The globes and orbits are within normal limits. IMPRESSION: 1. Left mastoid effusion with asymmetric enhancement and possible 6 mm lesion in the left nasopharynx. Consider further evaluation with CT neck with contrast and ENT consultation/examination. 2. No evidence for metastatic disease to the brain or meninges. 3. Periventricular T2 hyperintensities are mildly advanced for age. The finding is nonspecific but can be seen in the setting of  chronic microvascular ischemia, a demyelinating process such as multiple sclerosis, vasculitis, complicated migraine headaches, or as the sequelae of a prior infectious or inflammatory process. Electronically Signed   By: San Morelle M.D.   On: 02/20/2021 09:02     ASSESSMENT/PLAN:  This is a very pleasant 71 year old Caucasian male with  1) recurrent non-small cell lung cancer presenting as a stage IIIa with mediastinal lymphadenopathy in July 2021.  The patient has a history of early stage non-small cell lung cancer, adenosquamous carcinoma as stage Ib of the left upper lobe status post SBRT in 2019.  He also has a stage Ia3 non-small cell lung cancer status post SBRT to the left suprahilar region and AP window adenopathy. He completed a course of concurrent chemoradiation with weekly carboplatin and paclitaxel status post 6 cycles and tolerated his treatment well. The patient completed treatment with immunotherapy with Imfinzi 1500 mg IV every 4 weeks status post 13 cycles.    2) limited stage (T3, N0, M0) small cell lung cancer diagnosed in December 2022. He is currently undergoing a course of systemic chemotherapy with carboplatin for AUC of 5 on day 1, etoposide 100 Mg/M2 on days 1, 2 and 3 for 4 cycles.  This will be concurrent with radiotherapy.  Status post 1 cycle of treatment started on February 17, 2021.   Labs were reviewed.  Recommend that he proceed with cycle number 2 today scheduled.  We will see the patient back for follow-up visit in 3 weeks for evaluation and to review his scan results before starting cycle #3.  I will arrange for restaging CT scan the chest prior to starting his next cycle of treatment.  Radiation oncology is going to start with his radiation treatment starting from cycle #3.  He is scheduled for simulation on 03/16/2021.  The patient reports intermittent dysuria.  Denies any systemic symptoms such as fever or chills.  He is well-appearing today.  We  will arrange for him to have a UA and culture to rule out urinary tract infection.  The patient was advised to call immediately if he has any concerning symptoms in the interval. The patient voices understanding of current disease status and treatment options and is in agreement with the current care plan. All questions were answered. The patient knows to call the clinic with  any problems, questions or concerns. We can certainly see the patient much sooner if necessary      Orders Placed This Encounter  Procedures   CT Chest W Contrast    Standing Status:   Future    Standing Expiration Date:   03/09/2022    Order Specific Question:   If indicated for the ordered procedure, I authorize the administration of contrast media per Radiology protocol    Answer:   Yes    Order Specific Question:   Preferred imaging location?    Answer:   [facility]     The total time spent in the appointment was 20-29 minutes.   Gwynneth Fabio L Jalaina Salyers, PA-C 03/09/21

## 2021-03-02 ENCOUNTER — Inpatient Hospital Stay: Payer: Medicare Other

## 2021-03-02 ENCOUNTER — Other Ambulatory Visit: Payer: Self-pay

## 2021-03-02 DIAGNOSIS — Z5189 Encounter for other specified aftercare: Secondary | ICD-10-CM | POA: Diagnosis not present

## 2021-03-02 DIAGNOSIS — C3412 Malignant neoplasm of upper lobe, left bronchus or lung: Secondary | ICD-10-CM | POA: Diagnosis not present

## 2021-03-02 DIAGNOSIS — Z5111 Encounter for antineoplastic chemotherapy: Secondary | ICD-10-CM | POA: Diagnosis not present

## 2021-03-02 LAB — CBC WITH DIFFERENTIAL (CANCER CENTER ONLY)
Abs Immature Granulocytes: 0.33 10*3/uL — ABNORMAL HIGH (ref 0.00–0.07)
Basophils Absolute: 0 10*3/uL (ref 0.0–0.1)
Basophils Relative: 1 %
Eosinophils Absolute: 0.1 10*3/uL (ref 0.0–0.5)
Eosinophils Relative: 1 %
HCT: 33.1 % — ABNORMAL LOW (ref 39.0–52.0)
Hemoglobin: 11.1 g/dL — ABNORMAL LOW (ref 13.0–17.0)
Immature Granulocytes: 6 %
Lymphocytes Relative: 20 %
Lymphs Abs: 1.1 10*3/uL (ref 0.7–4.0)
MCH: 28.2 pg (ref 26.0–34.0)
MCHC: 33.5 g/dL (ref 30.0–36.0)
MCV: 84.2 fL (ref 80.0–100.0)
Monocytes Absolute: 0.6 10*3/uL (ref 0.1–1.0)
Monocytes Relative: 11 %
Neutro Abs: 3.6 10*3/uL (ref 1.7–7.7)
Neutrophils Relative %: 61 %
Platelet Count: 82 10*3/uL — ABNORMAL LOW (ref 150–400)
RBC: 3.93 MIL/uL — ABNORMAL LOW (ref 4.22–5.81)
RDW: 16.3 % — ABNORMAL HIGH (ref 11.5–15.5)
Smear Review: NORMAL
WBC Count: 5.8 10*3/uL (ref 4.0–10.5)
nRBC: 0 % (ref 0.0–0.2)

## 2021-03-02 LAB — CMP (CANCER CENTER ONLY)
ALT: 12 U/L (ref 0–44)
AST: 9 U/L — ABNORMAL LOW (ref 15–41)
Albumin: 3.3 g/dL — ABNORMAL LOW (ref 3.5–5.0)
Alkaline Phosphatase: 78 U/L (ref 38–126)
Anion gap: 6 (ref 5–15)
BUN: 15 mg/dL (ref 8–23)
CO2: 22 mmol/L (ref 22–32)
Calcium: 8.8 mg/dL — ABNORMAL LOW (ref 8.9–10.3)
Chloride: 109 mmol/L (ref 98–111)
Creatinine: 1.05 mg/dL (ref 0.61–1.24)
GFR, Estimated: >60 mL/min (ref >60)
Glucose, Bld: 105 mg/dL — ABNORMAL HIGH (ref 70–99)
Potassium: 4.6 mmol/L (ref 3.5–5.1)
Sodium: 137 mmol/L (ref 135–145)
Total Bilirubin: 0.4 mg/dL (ref 0.3–1.2)
Total Protein: 6.4 g/dL — ABNORMAL LOW (ref 6.5–8.1)

## 2021-03-03 DIAGNOSIS — E7849 Other hyperlipidemia: Secondary | ICD-10-CM | POA: Diagnosis not present

## 2021-03-03 DIAGNOSIS — J449 Chronic obstructive pulmonary disease, unspecified: Secondary | ICD-10-CM | POA: Diagnosis not present

## 2021-03-03 DIAGNOSIS — C649 Malignant neoplasm of unspecified kidney, except renal pelvis: Secondary | ICD-10-CM | POA: Diagnosis not present

## 2021-03-03 DIAGNOSIS — Z79891 Long term (current) use of opiate analgesic: Secondary | ICD-10-CM | POA: Diagnosis not present

## 2021-03-03 DIAGNOSIS — C3490 Malignant neoplasm of unspecified part of unspecified bronchus or lung: Secondary | ICD-10-CM | POA: Diagnosis not present

## 2021-03-03 DIAGNOSIS — I10 Essential (primary) hypertension: Secondary | ICD-10-CM | POA: Diagnosis not present

## 2021-03-07 ENCOUNTER — Encounter: Payer: Self-pay | Admitting: Internal Medicine

## 2021-03-09 ENCOUNTER — Inpatient Hospital Stay (HOSPITAL_BASED_OUTPATIENT_CLINIC_OR_DEPARTMENT_OTHER): Payer: Medicare Other | Admitting: Physician Assistant

## 2021-03-09 ENCOUNTER — Other Ambulatory Visit: Payer: Self-pay | Admitting: Physician Assistant

## 2021-03-09 ENCOUNTER — Inpatient Hospital Stay: Payer: Medicare Other

## 2021-03-09 ENCOUNTER — Other Ambulatory Visit: Payer: Self-pay

## 2021-03-09 ENCOUNTER — Encounter: Payer: Self-pay | Admitting: General Practice

## 2021-03-09 ENCOUNTER — Ambulatory Visit: Payer: Medicare Other | Admitting: Physician Assistant

## 2021-03-09 ENCOUNTER — Inpatient Hospital Stay: Payer: Medicare Other | Attending: Internal Medicine

## 2021-03-09 VITALS — BP 98/60 | HR 92 | Temp 97.7°F | Resp 18 | Ht 74.0 in

## 2021-03-09 DIAGNOSIS — C3412 Malignant neoplasm of upper lobe, left bronchus or lung: Secondary | ICD-10-CM | POA: Insufficient documentation

## 2021-03-09 DIAGNOSIS — R300 Dysuria: Secondary | ICD-10-CM

## 2021-03-09 DIAGNOSIS — R319 Hematuria, unspecified: Secondary | ICD-10-CM

## 2021-03-09 DIAGNOSIS — Z5111 Encounter for antineoplastic chemotherapy: Secondary | ICD-10-CM | POA: Insufficient documentation

## 2021-03-09 DIAGNOSIS — Z5189 Encounter for other specified aftercare: Secondary | ICD-10-CM | POA: Insufficient documentation

## 2021-03-09 DIAGNOSIS — C3492 Malignant neoplasm of unspecified part of left bronchus or lung: Secondary | ICD-10-CM

## 2021-03-09 DIAGNOSIS — N390 Urinary tract infection, site not specified: Secondary | ICD-10-CM

## 2021-03-09 LAB — CBC WITH DIFFERENTIAL (CANCER CENTER ONLY)
Abs Immature Granulocytes: 0.73 10*3/uL — ABNORMAL HIGH (ref 0.00–0.07)
Basophils Absolute: 0.1 10*3/uL (ref 0.0–0.1)
Basophils Relative: 1 %
Eosinophils Absolute: 0 10*3/uL (ref 0.0–0.5)
Eosinophils Relative: 0 %
HCT: 33.3 % — ABNORMAL LOW (ref 39.0–52.0)
Hemoglobin: 11.1 g/dL — ABNORMAL LOW (ref 13.0–17.0)
Immature Granulocytes: 7 %
Lymphocytes Relative: 17 %
Lymphs Abs: 1.7 10*3/uL (ref 0.7–4.0)
MCH: 28.1 pg (ref 26.0–34.0)
MCHC: 33.3 g/dL (ref 30.0–36.0)
MCV: 84.3 fL (ref 80.0–100.0)
Monocytes Absolute: 1 10*3/uL (ref 0.1–1.0)
Monocytes Relative: 10 %
Neutro Abs: 6.5 10*3/uL (ref 1.7–7.7)
Neutrophils Relative %: 65 %
Platelet Count: 377 10*3/uL (ref 150–400)
RBC: 3.95 MIL/uL — ABNORMAL LOW (ref 4.22–5.81)
RDW: 17.5 % — ABNORMAL HIGH (ref 11.5–15.5)
WBC Count: 10 10*3/uL (ref 4.0–10.5)
nRBC: 0 % (ref 0.0–0.2)

## 2021-03-09 LAB — CMP (CANCER CENTER ONLY)
ALT: 10 U/L (ref 0–44)
AST: 13 U/L — ABNORMAL LOW (ref 15–41)
Albumin: 3.3 g/dL — ABNORMAL LOW (ref 3.5–5.0)
Alkaline Phosphatase: 95 U/L (ref 38–126)
Anion gap: 9 (ref 5–15)
BUN: 18 mg/dL (ref 8–23)
CO2: 24 mmol/L (ref 22–32)
Calcium: 8.8 mg/dL — ABNORMAL LOW (ref 8.9–10.3)
Chloride: 103 mmol/L (ref 98–111)
Creatinine: 1.27 mg/dL — ABNORMAL HIGH (ref 0.61–1.24)
GFR, Estimated: >60 mL/min (ref >60)
Glucose, Bld: 102 mg/dL — ABNORMAL HIGH (ref 70–99)
Potassium: 4.3 mmol/L (ref 3.5–5.1)
Sodium: 136 mmol/L (ref 135–145)
Total Bilirubin: 0.3 mg/dL (ref 0.3–1.2)
Total Protein: 7.1 g/dL (ref 6.5–8.1)

## 2021-03-09 LAB — URINALYSIS, COMPLETE (UACMP) WITH MICROSCOPIC
Bilirubin Urine: NEGATIVE
Glucose, UA: NEGATIVE mg/dL
Hgb urine dipstick: NEGATIVE
Ketones, ur: NEGATIVE mg/dL
Nitrite: POSITIVE — AB
Protein, ur: 100 mg/dL — AB
Specific Gravity, Urine: 1.018 (ref 1.005–1.030)
WBC, UA: >50 WBC/hpf — ABNORMAL HIGH (ref 0–5)
pH: 8 (ref 5.0–8.0)

## 2021-03-09 MED ORDER — SODIUM CHLORIDE 0.9 % IV SOLN
10.0000 mg | Freq: Once | INTRAVENOUS | Status: AC
  Administered 2021-03-09: 10 mg via INTRAVENOUS
  Filled 2021-03-09: qty 10

## 2021-03-09 MED ORDER — SODIUM CHLORIDE 0.9 % IV SOLN
100.0000 mg/m2 | Freq: Once | INTRAVENOUS | Status: AC
  Administered 2021-03-09: 270 mg via INTRAVENOUS
  Filled 2021-03-09: qty 13.5

## 2021-03-09 MED ORDER — SODIUM CHLORIDE 0.9 % IV SOLN
150.0000 mg | Freq: Once | INTRAVENOUS | Status: AC
  Administered 2021-03-09: 150 mg via INTRAVENOUS
  Filled 2021-03-09: qty 150

## 2021-03-09 MED ORDER — SODIUM CHLORIDE 0.9 % IV SOLN: Freq: Once | INTRAVENOUS | Status: AC

## 2021-03-09 MED ORDER — SODIUM CHLORIDE 0.9 % IV SOLN
660.0000 mg | Freq: Once | INTRAVENOUS | Status: AC
  Administered 2021-03-09: 660 mg via INTRAVENOUS
  Filled 2021-03-09: qty 66

## 2021-03-09 MED ORDER — SULFAMETHOXAZOLE-TRIMETHOPRIM 800-160 MG PO TABS: 1.0000 | ORAL_TABLET | Freq: Two times a day (BID) | ORAL | 0 refills | Status: DC

## 2021-03-09 MED ORDER — PALONOSETRON HCL INJECTION 0.25 MG/5ML
0.2500 mg | Freq: Once | INTRAVENOUS | Status: AC
  Administered 2021-03-09: 0.25 mg via INTRAVENOUS

## 2021-03-09 MED ORDER — SODIUM CHLORIDE 0.9 % IV SOLN: 750.0000 mg | Freq: Once | INTRAVENOUS | Status: DC

## 2021-03-09 MED FILL — Dexamethasone Sodium Phosphate Inj 100 MG/10ML: INTRAMUSCULAR | Qty: 1 | Status: AC

## 2021-03-09 NOTE — Patient Instructions (Signed)
[facility] ONCOLOGY  Discharge Instructions: Thank you for choosing [facility] to provide your oncology and hematology care.   If you have a lab appointment with the [facility], please go directly to the [facility] and check in at the registration area.   Wear comfortable clothing and clothing appropriate for easy access to any Portacath or PICC line.   We strive to give you quality time with your provider. You may need to reschedule your appointment if you arrive late (15 or more minutes).  Arriving late affects you and other patients whose appointments are after yours.  Also, if you miss three or more appointments without notifying the office, you may be dismissed from the clinic at the providers discretion.      For prescription refill requests, have your pharmacy contact our office and allow 72 hours for refills to be completed.    Today you received the following chemotherapy and/or immunotherapy agents Carboplatin, Etoposide      To help prevent nausea and vomiting after your treatment, we encourage you to take your nausea medication as directed.  BELOW ARE SYMPTOMS THAT SHOULD BE REPORTED IMMEDIATELY: *FEVER GREATER THAN 100.4 F (38 C) OR HIGHER *CHILLS OR SWEATING *NAUSEA AND VOMITING THAT IS NOT CONTROLLED WITH YOUR NAUSEA MEDICATION *UNUSUAL SHORTNESS OF BREATH *UNUSUAL BRUISING OR BLEEDING *URINARY PROBLEMS (pain or burning when urinating, or frequent urination) *BOWEL PROBLEMS (unusual diarrhea, constipation, pain near the anus) TENDERNESS IN MOUTH AND THROAT WITH OR WITHOUT PRESENCE OF ULCERS (sore throat, sores in mouth, or a toothache) UNUSUAL RASH, SWELLING OR PAIN  UNUSUAL VAGINAL DISCHARGE OR ITCHING   Items with * indicate a potential emergency and should be followed up as soon as possible or go to the Emergency Department if any problems should occur.  Please show the CHEMOTHERAPY ALERT CARD or IMMUNOTHERAPY ALERT CARD at  check-in to the Emergency Department and triage nurse.  Should you have questions after your visit or need to cancel or reschedule your appointment, please contact [facility]  Dept: [phone]  and follow the prompts.  Office hours are 8:00 a.m. to 4:30 p.m. Monday - Friday. Please note that voicemails left after 4:00 p.m. may not be returned until the following business day.  We are closed weekends and major holidays. You have access to a nurse at all times for urgent questions. Please call the main number to the clinic Dept: [phone] and follow the prompts.   For any non-urgent questions, you may also contact your provider using MyChart. We now offer e-Visits for anyone 70 and older to request care online for non-urgent symptoms. For details visit [email or web address].   Also download the MyChart app! Go to the app store, search "MyChart", open the app, select [facility], and log in with your MyChart username and password.  Due to Covid, a mask is required upon entering the hospital/clinic. If you do not have a mask, one will be given to you upon arrival. For doctor visits, patients may have 1 support person aged 58 or older with them. For treatment visits, patients cannot have anyone with them due to current Covid guidelines and our immunocompromised population.

## 2021-03-09 NOTE — Progress Notes (Signed)
Calculated new Carboplatin dose w/ today's wt.  D/W [name] H, PA.  Kennith Center, Pharm.D., CPP 03/09/2021@2 :45 PM

## 2021-03-09 NOTE — Progress Notes (Signed)
[facility] Spiritual Care Note  Followed up with Mr Douthat in infusion. He reports doing well overall, though heartburn is wearing. He just learned that his cousin died yesterday and is taking comfort in knowing that his cousin lived a meaning-rich life and got to do many things that he really enjoyed. Provided reflective listening and emotional support. Mr Mercer has my card and knows to reach out whenever needed/desired.   [place], [place], [facility] Pager [phone] Voicemail [phone]

## 2021-03-09 NOTE — Progress Notes (Signed)
The patient is positive for UTI. Denies systemic symptoms. Rx for bactrim sent to pharmacy. Culture pending. The patient understands I may need to change treatment based on culture results.

## 2021-03-10 ENCOUNTER — Inpatient Hospital Stay: Payer: Medicare Other

## 2021-03-10 ENCOUNTER — Other Ambulatory Visit: Payer: Self-pay | Admitting: Medical Oncology

## 2021-03-10 VITALS — BP 165/71 | HR 67 | Temp 98.1°F | Resp 18

## 2021-03-10 DIAGNOSIS — I878 Other specified disorders of veins: Secondary | ICD-10-CM

## 2021-03-10 DIAGNOSIS — C3492 Malignant neoplasm of unspecified part of left bronchus or lung: Secondary | ICD-10-CM

## 2021-03-10 DIAGNOSIS — Z5189 Encounter for other specified aftercare: Secondary | ICD-10-CM | POA: Diagnosis not present

## 2021-03-10 DIAGNOSIS — R300 Dysuria: Secondary | ICD-10-CM | POA: Diagnosis not present

## 2021-03-10 DIAGNOSIS — Z5111 Encounter for antineoplastic chemotherapy: Secondary | ICD-10-CM | POA: Diagnosis not present

## 2021-03-10 DIAGNOSIS — C3412 Malignant neoplasm of upper lobe, left bronchus or lung: Secondary | ICD-10-CM | POA: Diagnosis not present

## 2021-03-10 MED ORDER — SODIUM CHLORIDE 0.9 % IV SOLN: Freq: Once | INTRAVENOUS | Status: AC

## 2021-03-10 MED ORDER — SODIUM CHLORIDE 0.9 % IV SOLN
10.0000 mg | Freq: Once | INTRAVENOUS | Status: AC
  Administered 2021-03-10: 10 mg via INTRAVENOUS
  Filled 2021-03-10: qty 10

## 2021-03-10 MED ORDER — SODIUM CHLORIDE 0.9 % IV SOLN
100.0000 mg/m2 | Freq: Once | INTRAVENOUS | Status: AC
  Administered 2021-03-10: 270 mg via INTRAVENOUS
  Filled 2021-03-10: qty 13.5

## 2021-03-10 MED FILL — Dexamethasone Sodium Phosphate Inj 100 MG/10ML: INTRAMUSCULAR | Qty: 1 | Status: AC

## 2021-03-10 NOTE — Progress Notes (Signed)
Pt requests port a cath.

## 2021-03-10 NOTE — Patient Instructions (Signed)
[facility] ONCOLOGY  Discharge Instructions: Thank you for choosing [facility] to provide your oncology and hematology care.   If you have a lab appointment with the [facility], please go directly to the [facility] and check in at the registration area.   Wear comfortable clothing and clothing appropriate for easy access to any Portacath or PICC line.   We strive to give you quality time with your provider. You may need to reschedule your appointment if you arrive late (15 or more minutes).  Arriving late affects you and other patients whose appointments are after yours.  Also, if you miss three or more appointments without notifying the office, you may be dismissed from the clinic at the providers discretion.      For prescription refill requests, have your pharmacy contact our office and allow 72 hours for refills to be completed.    Today you received the following chemotherapy and/or immunotherapy agents Etoposide      To help prevent nausea and vomiting after your treatment, we encourage you to take your nausea medication as directed.  BELOW ARE SYMPTOMS THAT SHOULD BE REPORTED IMMEDIATELY: *FEVER GREATER THAN 100.4 F (38 C) OR HIGHER *CHILLS OR SWEATING *NAUSEA AND VOMITING THAT IS NOT CONTROLLED WITH YOUR NAUSEA MEDICATION *UNUSUAL SHORTNESS OF BREATH *UNUSUAL BRUISING OR BLEEDING *URINARY PROBLEMS (pain or burning when urinating, or frequent urination) *BOWEL PROBLEMS (unusual diarrhea, constipation, pain near the anus) TENDERNESS IN MOUTH AND THROAT WITH OR WITHOUT PRESENCE OF ULCERS (sore throat, sores in mouth, or a toothache) UNUSUAL RASH, SWELLING OR PAIN  UNUSUAL VAGINAL DISCHARGE OR ITCHING   Items with * indicate a potential emergency and should be followed up as soon as possible or go to the Emergency Department if any problems should occur.  Please show the CHEMOTHERAPY ALERT CARD or IMMUNOTHERAPY ALERT CARD at check-in to  the Emergency Department and triage nurse.  Should you have questions after your visit or need to cancel or reschedule your appointment, please contact [facility]  Dept: [phone]  and follow the prompts.  Office hours are 8:00 a.m. to 4:30 p.m. Monday - Friday. Please note that voicemails left after 4:00 p.m. may not be returned until the following business day.  We are closed weekends and major holidays. You have access to a nurse at all times for urgent questions. Please call the main number to the clinic Dept: [phone] and follow the prompts.   For any non-urgent questions, you may also contact your provider using MyChart. We now offer e-Visits for anyone 45 and older to request care online for non-urgent symptoms. For details visit [email or web address].   Also download the MyChart app! Go to the app store, search "MyChart", open the app, select [facility], and log in with your MyChart username and password.  Due to Covid, a mask is required upon entering the hospital/clinic. If you do not have a mask, one will be given to you upon arrival. For doctor visits, patients may have 1 support person aged 101 or older with them. For treatment visits, patients cannot have anyone with them due to current Covid guidelines and our immunocompromised population.

## 2021-03-11 ENCOUNTER — Other Ambulatory Visit: Payer: Self-pay

## 2021-03-11 ENCOUNTER — Inpatient Hospital Stay: Payer: Medicare Other

## 2021-03-11 VITALS — BP 135/73 | HR 71 | Temp 98.3°F | Resp 28 | Ht 74.0 in | Wt 337.5 lb

## 2021-03-11 DIAGNOSIS — R300 Dysuria: Secondary | ICD-10-CM | POA: Diagnosis not present

## 2021-03-11 DIAGNOSIS — Z5111 Encounter for antineoplastic chemotherapy: Secondary | ICD-10-CM | POA: Diagnosis not present

## 2021-03-11 DIAGNOSIS — Z5189 Encounter for other specified aftercare: Secondary | ICD-10-CM | POA: Diagnosis not present

## 2021-03-11 DIAGNOSIS — C3412 Malignant neoplasm of upper lobe, left bronchus or lung: Secondary | ICD-10-CM | POA: Diagnosis not present

## 2021-03-11 DIAGNOSIS — C3492 Malignant neoplasm of unspecified part of left bronchus or lung: Secondary | ICD-10-CM

## 2021-03-11 MED ORDER — SODIUM CHLORIDE 0.9 % IV SOLN
100.0000 mg/m2 | Freq: Once | INTRAVENOUS | Status: AC
  Administered 2021-03-11: 270 mg via INTRAVENOUS
  Filled 2021-03-11: qty 13.5

## 2021-03-11 MED ORDER — SODIUM CHLORIDE 0.9 % IV SOLN
10.0000 mg | Freq: Once | INTRAVENOUS | Status: AC
  Administered 2021-03-11: 10 mg via INTRAVENOUS
  Filled 2021-03-11: qty 10

## 2021-03-11 MED ORDER — SODIUM CHLORIDE 0.9 % IV SOLN: Freq: Once | INTRAVENOUS | Status: AC

## 2021-03-11 NOTE — Patient Instructions (Signed)
[facility]  Discharge Instructions: Thank you for choosing [facility] to provide your oncology and hematology care.   If you have a lab appointment with the [facility], please go directly to the [facility] and check in at the registration area.   Wear comfortable clothing and clothing appropriate for easy access to any Portacath or PICC line.   We strive to give you quality time with your provider. You may need to reschedule your appointment if you arrive late (15 or more minutes).  Arriving late affects you and other patients whose appointments are after yours.  Also, if you miss three or more appointments without notifying the office, you may be dismissed from the clinic at the providers discretion.      For prescription refill requests, have your pharmacy contact our office and allow 72 hours for refills to be completed.    Today you received the following chemotherapy and/or immunotherapy agent: Etoposide.    To help prevent nausea and vomiting after your treatment, we encourage you to take your nausea medication as directed.  BELOW ARE SYMPTOMS THAT SHOULD BE REPORTED IMMEDIATELY: *FEVER GREATER THAN 100.4 F (38 C) OR HIGHER *CHILLS OR SWEATING *NAUSEA AND VOMITING THAT IS NOT CONTROLLED WITH YOUR NAUSEA MEDICATION *UNUSUAL SHORTNESS OF BREATH *UNUSUAL BRUISING OR BLEEDING *URINARY PROBLEMS (pain or burning when urinating, or frequent urination) *BOWEL PROBLEMS (unusual diarrhea, constipation, pain near the anus) TENDERNESS IN MOUTH AND THROAT WITH OR WITHOUT PRESENCE OF ULCERS (sore throat, sores in mouth, or a toothache) UNUSUAL RASH, SWELLING OR PAIN  UNUSUAL VAGINAL DISCHARGE OR ITCHING   Items with * indicate a potential emergency and should be followed up as soon as possible or go to the Emergency Department if any problems should occur.  Please show the CHEMOTHERAPY ALERT CARD or IMMUNOTHERAPY ALERT CARD at check-in to  the Emergency Department and triage nurse.  Should you have questions after your visit or need to cancel or reschedule your appointment, please contact [facility]  Dept: [phone]  and follow the prompts.  Office hours are 8:00 a.m. to 4:30 p.m. Monday - Friday. Please note that voicemails left after 4:00 p.m. may not be returned until the following business day.  We are closed weekends and major holidays. You have access to a nurse at all times for urgent questions. Please call the main number to the clinic Dept: [phone] and follow the prompts.   For any non-urgent questions, you may also contact your provider using MyChart. We now offer e-Visits for anyone 45 and older to request care online for non-urgent symptoms. For details visit [email or web address].   Also download the MyChart app! Go to the app store, search "MyChart", open the app, select [facility], and log in with your MyChart username and password.  Due to Covid, a mask is required upon entering the hospital/clinic. If you do not have a mask, one will be given to you upon arrival. For doctor visits, patients may have 1 support person aged 33 or older with them. For treatment visits, patients cannot have anyone with them due to current Covid guidelines and our immunocompromised population.

## 2021-03-12 ENCOUNTER — Encounter (HOSPITAL_COMMUNITY): Payer: Self-pay

## 2021-03-12 ENCOUNTER — Ambulatory Visit: Payer: Medicare Other

## 2021-03-12 ENCOUNTER — Telehealth: Payer: Self-pay | Admitting: Physician Assistant

## 2021-03-12 LAB — URINE CULTURE: Culture: 70000 — AB

## 2021-03-12 MED ORDER — LIDOCAINE-PRILOCAINE 2.5-2.5 % EX CREA: 1.0000 "application " | TOPICAL_CREAM | CUTANEOUS | 2 refills | Status: DC | PRN

## 2021-03-12 NOTE — Telephone Encounter (Signed)
I talked to the patient's sister in the lobby yesterday.  I told her I would expect to have the patient's culture and sensitivities back today.  I received the results and it does look like the antibiotic sent a few days ago should be effective for his urinary tract infection.  Therefore, he will continue taking this as planned.  I had discussed with her yesterday that of course if he develops any worsening symptoms such as fevers, chills, nausea, vomiting, back pain, or continued urinary complaints to please call the clinic back.  She also mentioned that her brother was going to have a Port-A-Cath placed next week.  I have reviewed his chart and I do not see that he has a prescription for the Emla cream.  I have sent this to the patient's pharmacy.  I had called and left a voicemail with this information.

## 2021-03-13 ENCOUNTER — Other Ambulatory Visit: Payer: Self-pay

## 2021-03-13 ENCOUNTER — Inpatient Hospital Stay: Payer: Medicare Other

## 2021-03-13 VITALS — BP 163/46 | HR 102 | Temp 97.7°F | Resp 18

## 2021-03-13 DIAGNOSIS — C3492 Malignant neoplasm of unspecified part of left bronchus or lung: Secondary | ICD-10-CM

## 2021-03-13 DIAGNOSIS — Z5111 Encounter for antineoplastic chemotherapy: Secondary | ICD-10-CM | POA: Diagnosis not present

## 2021-03-13 DIAGNOSIS — C3412 Malignant neoplasm of upper lobe, left bronchus or lung: Secondary | ICD-10-CM | POA: Diagnosis not present

## 2021-03-13 DIAGNOSIS — Z5189 Encounter for other specified aftercare: Secondary | ICD-10-CM | POA: Diagnosis not present

## 2021-03-13 DIAGNOSIS — R300 Dysuria: Secondary | ICD-10-CM | POA: Diagnosis not present

## 2021-03-13 MED ORDER — PEGFILGRASTIM-BMEZ 6 MG/0.6ML ~~LOC~~ SOSY
6.0000 mg | PREFILLED_SYRINGE | Freq: Once | SUBCUTANEOUS | Status: AC
  Administered 2021-03-13: 6 mg via SUBCUTANEOUS
  Filled 2021-03-13: qty 0.6

## 2021-03-13 NOTE — Patient Instructions (Signed)

## 2021-03-15 ENCOUNTER — Inpatient Hospital Stay: Payer: Medicare Other | Admitting: *Deleted

## 2021-03-15 ENCOUNTER — Other Ambulatory Visit: Payer: Self-pay

## 2021-03-15 ENCOUNTER — Other Ambulatory Visit: Payer: Medicare Other

## 2021-03-15 DIAGNOSIS — C3492 Malignant neoplasm of unspecified part of left bronchus or lung: Secondary | ICD-10-CM

## 2021-03-15 NOTE — Progress Notes (Signed)
[facility] Directives Clinical Social Work  Patient completed advance directives in [facility] today.   Clinical Social Worker notarized documents and made copies for patient/family. Clinical Social Worker will send documents to medical records to be scanned into patient's chart. Clinical Social Worker encouraged patient/family to contact with any additional questions or concerns.  Maryjean Morn, MSW, LCSW Clinical Social Worker [facility] [phone]

## 2021-03-16 ENCOUNTER — Inpatient Hospital Stay: Payer: Medicare Other

## 2021-03-16 ENCOUNTER — Ambulatory Visit
Admission: RE | Admit: 2021-03-16 | Discharge: 2021-03-16 | Disposition: A | Discharge status: Home / Self Care | Payer: Medicare Other | Source: Ambulatory Visit | Attending: Radiation Oncology | Admitting: Radiation Oncology

## 2021-03-16 DIAGNOSIS — C3412 Malignant neoplasm of upper lobe, left bronchus or lung: Secondary | ICD-10-CM | POA: Diagnosis not present

## 2021-03-16 DIAGNOSIS — F17210 Nicotine dependence, cigarettes, uncomplicated: Secondary | ICD-10-CM | POA: Insufficient documentation

## 2021-03-16 LAB — CMP (CANCER CENTER ONLY)
ALT: 13 U/L (ref 0–44)
AST: 11 U/L — ABNORMAL LOW (ref 15–41)
Albumin: 3.4 g/dL — ABNORMAL LOW (ref 3.5–5.0)
Alkaline Phosphatase: 93 U/L (ref 38–126)
Anion gap: 6 (ref 5–15)
BUN: 20 mg/dL (ref 8–23)
CO2: 22 mmol/L (ref 22–32)
Calcium: 8.6 mg/dL — ABNORMAL LOW (ref 8.9–10.3)
Chloride: 103 mmol/L (ref 98–111)
Creatinine: 0.87 mg/dL (ref 0.61–1.24)
GFR, Estimated: >60 mL/min (ref >60)
Glucose, Bld: 96 mg/dL (ref 70–99)
Potassium: 4.9 mmol/L (ref 3.5–5.1)
Sodium: 131 mmol/L — ABNORMAL LOW (ref 135–145)
Total Bilirubin: 0.7 mg/dL (ref 0.3–1.2)
Total Protein: 6.7 g/dL (ref 6.5–8.1)

## 2021-03-16 LAB — CBC WITH DIFFERENTIAL (CANCER CENTER ONLY)
Abs Immature Granulocytes: 0 10*3/uL (ref 0.00–0.07)
Band Neutrophils: 1 %
Basophils Absolute: 0.2 10*3/uL — ABNORMAL HIGH (ref 0.0–0.1)
Basophils Relative: 1 %
Eosinophils Absolute: 0 10*3/uL (ref 0.0–0.5)
Eosinophils Relative: 0 %
HCT: 31.1 % — ABNORMAL LOW (ref 39.0–52.0)
Hemoglobin: 10.5 g/dL — ABNORMAL LOW (ref 13.0–17.0)
Lymphocytes Relative: 10 %
Lymphs Abs: 1.6 10*3/uL (ref 0.7–4.0)
MCH: 28.5 pg (ref 26.0–34.0)
MCHC: 33.8 g/dL (ref 30.0–36.0)
MCV: 84.3 fL (ref 80.0–100.0)
Monocytes Absolute: 0.2 10*3/uL (ref 0.1–1.0)
Monocytes Relative: 1 %
Neutro Abs: 14.3 10*3/uL — ABNORMAL HIGH (ref 1.7–7.7)
Neutrophils Relative %: 87 %
Platelet Count: 458 10*3/uL — ABNORMAL HIGH (ref 150–400)
RBC: 3.69 MIL/uL — ABNORMAL LOW (ref 4.22–5.81)
RDW: 17.7 % — ABNORMAL HIGH (ref 11.5–15.5)
Smear Review: NORMAL
WBC Count: 16.3 10*3/uL — ABNORMAL HIGH (ref 4.0–10.5)
nRBC: 0 % (ref 0.0–0.2)

## 2021-03-17 ENCOUNTER — Ambulatory Visit: Payer: Medicare Other | Admitting: Internal Medicine

## 2021-03-17 DIAGNOSIS — I10 Essential (primary) hypertension: Secondary | ICD-10-CM | POA: Diagnosis not present

## 2021-03-17 DIAGNOSIS — M5020 Other cervical disc displacement, unspecified cervical region: Secondary | ICD-10-CM | POA: Diagnosis not present

## 2021-03-17 DIAGNOSIS — M5137 Other intervertebral disc degeneration, lumbosacral region: Secondary | ICD-10-CM | POA: Diagnosis not present

## 2021-03-18 ENCOUNTER — Encounter: Payer: Self-pay | Admitting: Internal Medicine

## 2021-03-18 ENCOUNTER — Other Ambulatory Visit (HOSPITAL_COMMUNITY): Payer: Self-pay | Admitting: Physician Assistant

## 2021-03-19 ENCOUNTER — Telehealth: Payer: Self-pay

## 2021-03-19 ENCOUNTER — Other Ambulatory Visit: Payer: Self-pay

## 2021-03-19 ENCOUNTER — Encounter (HOSPITAL_COMMUNITY): Payer: Self-pay

## 2021-03-19 ENCOUNTER — Other Ambulatory Visit: Payer: Self-pay | Admitting: Internal Medicine

## 2021-03-19 ENCOUNTER — Ambulatory Visit (HOSPITAL_COMMUNITY)
Admission: RE | Admit: 2021-03-19 | Discharge: 2021-03-19 | Disposition: A | Discharge status: Home / Self Care | Payer: Medicare Other | Source: Ambulatory Visit | Attending: Internal Medicine | Admitting: Internal Medicine

## 2021-03-19 ENCOUNTER — Encounter: Payer: Self-pay | Admitting: Internal Medicine

## 2021-03-19 DIAGNOSIS — I878 Other specified disorders of veins: Secondary | ICD-10-CM

## 2021-03-19 DIAGNOSIS — C3490 Malignant neoplasm of unspecified part of unspecified bronchus or lung: Secondary | ICD-10-CM | POA: Insufficient documentation

## 2021-03-19 DIAGNOSIS — Z452 Encounter for adjustment and management of vascular access device: Secondary | ICD-10-CM | POA: Diagnosis not present

## 2021-03-19 HISTORY — PX: IR IMAGING GUIDED PORT INSERTION: IMG5740

## 2021-03-19 MED ORDER — MIDAZOLAM HCL 2 MG/2ML IJ SOLN
INTRAMUSCULAR | Status: AC | PRN
Start: 2021-03-19 — End: 2021-03-19
  Administered 2021-03-19 (×4): 1 mg via INTRAVENOUS

## 2021-03-19 MED ORDER — SODIUM CHLORIDE 0.9 % IV SOLN: INTRAVENOUS | Status: DC

## 2021-03-19 MED ORDER — LIDOCAINE-EPINEPHRINE 1 %-1:100000 IJ SOLN
INTRAMUSCULAR | Status: AC
  Filled 2021-03-19: qty 1

## 2021-03-19 MED ORDER — FENTANYL CITRATE (PF) 100 MCG/2ML IJ SOLN
INTRAMUSCULAR | Status: AC | PRN
Start: 2021-03-19 — End: 2021-03-19
  Administered 2021-03-19 (×3): 50 ug via INTRAVENOUS

## 2021-03-19 MED ORDER — MIDAZOLAM HCL 2 MG/2ML IJ SOLN
INTRAMUSCULAR | Status: AC
  Filled 2021-03-19: qty 4

## 2021-03-19 MED ORDER — LIDOCAINE-EPINEPHRINE (PF) 1 %-1:200000 IJ SOLN
INTRAMUSCULAR | Status: AC | PRN
  Administered 2021-03-19: 20 mL

## 2021-03-19 MED ORDER — HEPARIN SOD (PORK) LOCK FLUSH 100 UNIT/ML IV SOLN
INTRAVENOUS | Status: AC | PRN
  Administered 2021-03-19: 500 [IU] via INTRAVENOUS

## 2021-03-19 MED ORDER — HEPARIN SOD (PORK) LOCK FLUSH 100 UNIT/ML IV SOLN
INTRAVENOUS | Status: AC
  Filled 2021-03-19: qty 5

## 2021-03-19 MED ORDER — FENTANYL CITRATE (PF) 100 MCG/2ML IJ SOLN
INTRAMUSCULAR | Status: AC
  Filled 2021-03-19: qty 2

## 2021-03-19 NOTE — Telephone Encounter (Signed)
Patient's sister called saying that Reginald Berry has been having more anxiety than usual and that he may need something in addition to the xanax he takes at night.

## 2021-03-19 NOTE — Procedures (Signed)
Interventional Radiology Procedure Note  Procedure: RT IJ POWER PORT    Complications: None  Estimated Blood Loss:  MIN  Findings: TIP SVCRA    M. TREVOR Kassidy Frankson, MD    

## 2021-03-19 NOTE — H&P (Signed)
Chief Complaint: Patient was seen in consultation today for port placement at the request of [facility]  Referring Physician(s): Mohamed,Mohamed  Supervising Physician: Daryll Brod  Patient Status: [facility] - Out-pt  History of Present Illness: Reginald Berry is a 71 y.o. male with lung cancer. He is currently receiving treatment but having increasing trouble with PIV access. He is referred for port placement. PMHx, meds, labs, imaging, allergies reviewed. Feels well, no recent fevers, chills, illness. Has been NPO today as directed.   Past Medical History:  Diagnosis Date   Anxiety    Aortic atherosclerosis (HCC)    Arthritis    Chronic low back pain    COPD (chronic obstructive pulmonary disease) (HCC)    Coronary artery calcification seen on CT scan    Multivessel   Depression    Essential hypertension    Gait instability    Previous logging accident   GERD (gastroesophageal reflux disease)    Lung cancer ([place])    Poorly differentiated, XRT   Renal cell carcinoma (HCC)    Left, status post cryoablation August 2020    Past Surgical History:  Procedure Laterality Date   BRONCHIAL BIOPSY  01/27/2021   Procedure: BRONCHIAL BIOPSIES;  Surgeon: Garner Nash, DO;  Location: [facility];  Service: Pulmonary;;   BRONCHIAL BRUSHINGS  01/27/2021   Procedure: BRONCHIAL BRUSHINGS;  Surgeon: Garner Nash, DO;  Location: [facility];  Service: Pulmonary;;   BRONCHIAL NEEDLE ASPIRATION BIOPSY  01/27/2021   Procedure: BRONCHIAL NEEDLE ASPIRATION BIOPSIES;  Surgeon: Garner Nash, DO;  Location: [facility];  Service: Pulmonary;;   CHOLECYSTECTOMY     2019   FRACTURE SURGERY     bil legs (logging trees)   IR RADIOLOGIST EVAL & MGMT  09/27/2018   IR RADIOLOGIST EVAL & MGMT  11/22/2018   IR RADIOLOGIST EVAL & MGMT  04/10/2019   IR RADIOLOGIST EVAL & MGMT  10/13/2020   NECK SURGERY  1994-1995   RADIOLOGY WITH ANESTHESIA Left 10/31/2018   Procedure: CT WITH  ANESTHESIA RENAL CRYO ABLATION;  Surgeon: Aletta Edouard, MD;  Location: [facility];  Service: Radiology;  Laterality: Left;   VIDEO BRONCHOSCOPY WITH ENDOBRONCHIAL ULTRASOUND Bilateral 01/27/2021   Procedure: VIDEO BRONCHOSCOPY WITH ENDOBRONCHIAL ULTRASOUND;  Surgeon: Garner Nash, DO;  Location: [facility];  Service: Pulmonary;  Laterality: Bilateral;   VIDEO BRONCHOSCOPY WITH RADIAL ENDOBRONCHIAL ULTRASOUND  01/27/2021   Procedure: VIDEO BRONCHOSCOPY WITH RADIAL ENDOBRONCHIAL ULTRASOUND;  Surgeon: Garner Nash, DO;  Location: [facility];  Service: Pulmonary;;    Allergies: Patient has no known allergies.  Medications: Prior to Admission medications   Medication Sig Start Date End Date Taking? Authorizing Provider  albuterol (PROVENTIL) (2.5 MG/3ML) 0.083% nebulizer solution Take 3 mLs (2.5 mg total) by nebulization every 6 (six) hours as needed for wheezing or shortness of breath. 02/08/21  Yes Icard, Octavio Graves, DO  ALPRAZolam (XANAX) 0.5 MG tablet Take 0.5 mg by mouth at bedtime. 05/14/20  Yes [provider]  amLODipine (NORVASC) 10 MG tablet Take 10 mg by mouth daily.   Yes [provider]  aspirin 81 MG EC tablet Take 81 mg by mouth daily. Swallow whole.   Yes [provider]  buPROPion (WELLBUTRIN XL) 300 MG 24 hr tablet Take 300 mg by mouth daily. 07/26/18  Yes [provider]  busPIRone (BUSPAR) 15 MG tablet Take 15 mg by mouth daily. 07/26/18  Yes [provider]  cetirizine (ZYRTEC) 10 MG tablet Take 10 mg by  mouth daily.   Yes [provider]  Fluticasone-Umeclidin-Vilant (TRELEGY ELLIPTA) 100-62.5-25 MCG/ACT AEPB Inhale 1 puff into the lungs daily. 02/05/21  Yes Icard, Bradley L, DO  furosemide (LASIX) 40 MG tablet Take 40 mg by mouth daily.    Yes [provider]  hydrochlorothiazide (MICROZIDE) 12.5 MG capsule Take 12.5 mg by mouth daily.   Yes [provider]  liothyronine (CYTOMEL) 25 MCG tablet Take  25 mcg by mouth daily. 07/26/18  Yes [provider]  losartan (COZAAR) 100 MG tablet Take 100 mg by mouth daily. 07/26/18  Yes [provider]  Multiple Vitamins-Minerals (CENTRUM SILVER 50+MEN) TABS Take 1 tablet by mouth daily.   Yes [provider]  oxyCODONE-acetaminophen (PERCOCET) 10-325 MG tablet Take 1 tablet by mouth 4 (four) times daily as needed for pain.   Yes [provider]  pantoprazole (PROTONIX) 40 MG tablet Take 40 mg by mouth every evening.    Yes [provider]  Probiotic Product (PROBIOTIC DAILY PO) Take 1 capsule by mouth daily.   Yes [provider]  prochlorperazine (COMPAZINE) 10 MG tablet Take 1 tablet (10 mg total) by mouth every 6 (six) hours as needed for nausea or vomiting. 02/09/21  Yes Curt Bears, MD  rosuvastatin (CRESTOR) 5 MG tablet Take 5 mg by mouth at bedtime.   Yes [provider]  sulfamethoxazole-trimethoprim (BACTRIM DS) 800-160 MG tablet Take 1 tablet by mouth 2 (two) times daily. 03/09/21  Yes Heilingoetter, Cassandra L, PA-C  Fluticasone-Umeclidin-Vilant (TRELEGY ELLIPTA) 100-62.5-25 MCG/ACT AEPB Inhale 1 puff into the lungs daily. 02/05/21   Garner Nash, DO  lidocaine-prilocaine (EMLA) cream Apply 1 application topically as needed. 03/12/21   Heilingoetter, Cassandra L, PA-C  prochlorperazine (COMPAZINE) 10 MG tablet TAKE ONE TABLET EVERY 6 HOURS AS NEEDED 08/26/20   Heilingoetter, Cassandra L, PA-C     Family History  Problem Relation Age of Onset   Heart attack Mother    Renal Disease Mother    Heart failure Father    Heart attack Father    Diabetes Sister    Heart disease Brother     Social History   Socioeconomic History   Marital status: Single    Spouse name: Not on file   Number of children: Not on file   Years of education: Not on file   Highest education level: Not on file  Occupational History   Not on file  Tobacco Use   Smoking status: Every Day     Packs/day: 0.50    Types: Cigarettes   Smokeless tobacco: Never   Tobacco comments:    10 a day  Vaping Use   Vaping Use: Never used  Substance and Sexual Activity   Alcohol use: Not Currently    Comment: BEER ONCE IN A WHILE    Drug use: Never   Sexual activity: Not Currently  Other Topics Concern   Not on file  Social History Narrative   10-23-17 Unable to ask abuse questions wife and other family with him today.   Social Determinants of Health   Financial Resource Strain: Not on file  Food Insecurity: Not on file  Transportation Needs: Unmet Transportation Needs   Lack of Transportation (Medical): Yes   Lack of Transportation (Non-Medical): Yes  Physical Activity: Not on file  Stress: Not on file  Social Connections: Not on file    Review of Systems: A 12 point ROS discussed and pertinent positives are indicated in the HPI above.  All  other systems are negative.  Review of Systems  Vital Signs: BP (!) (P) 145/88    Pulse (P) 77    Temp (P) 98 F (36.7 C) (Oral)    Resp (P) 20    SpO2 (P) 99%   Physical Exam Constitutional:      Appearance: Normal appearance.  HENT:     Mouth/Throat:     Mouth: Mucous membranes are moist.     Pharynx: Oropharynx is clear.  Cardiovascular:     Rate and Rhythm: Normal rate and regular rhythm.     Heart sounds: Normal heart sounds.  Pulmonary:     Effort: Pulmonary effort is normal. No respiratory distress.     Breath sounds: Normal breath sounds.  Skin:    General: Skin is warm and dry.  Neurological:     General: No focal deficit present.     Mental Status: He is alert and oriented to person, place, and time.  Psychiatric:        Mood and Affect: Mood normal.        Thought Content: Thought content normal.     Imaging: MR BRAIN W WO CONTRAST  Result Date: 02/20/2021 CLINICAL DATA:  Small cell lung cancer. Staging. EXAM: MRI HEAD WITHOUT AND WITH CONTRAST TECHNIQUE: Multiplanar, multiecho pulse sequences of the brain  and surrounding structures were obtained without and with intravenous contrast. CONTRAST:  57mL GADAVIST GADOBUTROL 1 MMOL/ML IV SOLN COMPARISON:  None. FINDINGS: Brain: No acute infarct, hemorrhage, or mass lesion is present. The ventricles are of normal size. No significant extraaxial fluid collection is present. Periventricular T2 hyperintensities are mildly advanced for age. No other significant white matter disease is present. The ventricles are of normal size. No significant extraaxial fluid collection is present. The internal auditory canals are within normal limits. The brainstem and cerebellum are within normal limits. Vascular: Flow is present in the major intracranial arteries. Skull and upper cervical spine: Disc disease at C2-3 and C3-4 is well is a narrow canal result in some degree of central canal stenosis. Craniocervical junction is within normal limits. Marrow signal is unremarkable. No focal lesions are present. Sinuses/Orbits: A left mastoid effusion is present. Asymmetric enhancement is present in left nasopharynx. 6 mm mass lesion may be present. A separate Tornwaldt cyst is noted. The paranasal sinuses and mastoid air cells are otherwise clear. The globes and orbits are within normal limits. IMPRESSION: 1. Left mastoid effusion with asymmetric enhancement and possible 6 mm lesion in the left nasopharynx. Consider further evaluation with CT neck with contrast and ENT consultation/examination. 2. No evidence for metastatic disease to the brain or meninges. 3. Periventricular T2 hyperintensities are mildly advanced for age. The finding is nonspecific but can be seen in the setting of chronic microvascular ischemia, a demyelinating process such as multiple sclerosis, vasculitis, complicated migraine headaches, or as the sequelae of a prior infectious or inflammatory process. Electronically Signed   By: San Morelle M.D.   On: 02/20/2021 09:02    Labs:  CBC: Recent Labs     02/22/21 0953 03/02/21 1114 03/09/21 1241 03/16/21 1218  WBC 7.9 5.8 10.0 16.3*  HGB 13.5 11.1* 11.1* 10.5*  HCT 40.4 33.1* 33.3* 31.1*  PLT 280 82* 377 458*    COAGS: No results for input(s): INR, APTT in the last 8760 hours.  BMP: Recent Labs    02/22/21 0953 03/02/21 1114 03/09/21 1241 03/16/21 1218  NA 135 137 136 131*  K 4.4 4.6 4.3 4.9  CL  105 109 103 103  CO2 21* 22 24 22   GLUCOSE 117* 105* 102* 96  BUN 32* 15 18 20   CALCIUM 8.2* 8.8* 8.8* 8.6*  CREATININE 1.03 1.05 1.27* 0.87  GFRNONAA >60 >60 >60 >60    LIVER FUNCTION TESTS: Recent Labs    02/22/21 0953 03/02/21 1114 03/09/21 1241 03/16/21 1218  BILITOT 0.6 0.4 0.3 0.7  AST 14* 9* 13* 11*  ALT 15 12 10 13   ALKPHOS 88 78 95 93  PROT 6.7 6.4* 7.1 6.7  ALBUMIN 3.1* 3.3* 3.3* 3.4*    TUMOR MARKERS: No results for input(s): AFPTM, CEA, CA199, CHROMGRNA in the last 8760 hours.  Assessment and Plan: Lung cancer For port placement Risks and benefits of image guided port-a-catheter placement was discussed with the patient including, but not limited to bleeding, infection, pneumothorax, or fibrin sheath development and need for additional procedures.  All of the patient's questions were answered, patient is agreeable to proceed. Consent signed and in chart.   Thank you for this interesting consult.  I greatly enjoyed meeting Reginald Berry and look forward to participating in their care.  A copy of this report was sent to the requesting provider on this date.  Electronically Signed: Ascencion Dike, PA-C 03/19/2021, 11:02 AM   I spent a total of 20 minutes in face to face in clinical consultation, greater than 50% of which was counseling/coordinating care for port

## 2021-03-19 NOTE — Discharge Instructions (Signed)
Interventional radiology phone numbers °[phone] °After hours [phone] ° ° ° °You have skin glue (dermabond) over your new port. Do not use the lidocaine cream (EMLA cream) over the skin glue until it has healed. The petroleum in the lidocaine cream will dissolve the skin glue resulting in an infection of your new port. Use ice in a zip lock bag for 1-2 minutes over your new port before the cancer center nurses access your port. ° ° °Implanted Port Insertion, Care After °This sheet gives you information about how to care for yourself after your procedure. Your health care provider may also give you more specific instructions. If you have problems or questions, contact your health care provider. °What can I expect after the procedure? °After the procedure, it is common to have: °Discomfort at the port insertion site. °Bruising on the skin over the port. This should improve over 3-4 days. °Follow these instructions at home: °Port care °After your port is placed, you will get a manufacturer's information card. The card has information about your port. Keep this card with you at all times. °Take care of the port as told by your health care provider. Ask your health care provider if you or a family member can get training for taking care of the port at home. A home health care nurse may also take care of the port. °Make sure to remember what type of port you have. °Incision care °Follow instructions from your health care provider about how to take care of your port insertion site. Make sure you: °Wash your hands with soap and water before and after you change your bandage (dressing). If soap and water are not available, use hand sanitizer. °Change your dressing as told by your health care provider. °Leave skin glue in place. These skin closures may need to stay in place for 2 weeks or longer.  °Check your port insertion site every day for signs of infection. Check for: °Redness, swelling, or pain. °Fluid or  blood. °Warmth. °Pus or a bad smell.  °  °  °Activity °Return to your normal activities as told by your health care provider. Ask your health care provider what activities are safe for you. °Do not lift anything that is heavier than 10 lb (4.5 kg), or the limit that you are told, until your health care provider says that it is safe. °General instructions °Take over-the-counter and prescription medicines only as told by your health care provider. °Do not take baths, swim, or use a hot tub until your health care provider approves.You may remove your dressing tomorrow and shower 24 hours after your procedure. °Do not drive for 24 hours if you were given a sedative during your procedure. °Wear a medical alert bracelet in case of an emergency. This will tell any health care providers that you have a port. °Keep all follow-up visits as told by your health care provider. This is important. °Contact a health care provider if: °You cannot flush your port with saline as directed, or you cannot draw blood from the port. °You have a fever or chills. °You have redness, swelling, or pain around your port insertion site. °You have fluid or blood coming from your port insertion site. °Your port insertion site feels warm to the touch. °You have pus or a bad smell coming from the port insertion site. °Get help right away if: °You have chest pain or shortness of breath. °You have bleeding from your port that you cannot control. °Summary °Take care of   the port as told by your health care provider. Keep the manufacturer's information card with you at all times. °Change your dressing as told by your health care provider. °Contact a health care provider if you have a fever or chills or if you have redness, swelling, or pain around your port insertion site. °Keep all follow-up visits as told by your health care provider. °This information is not intended to replace advice given to you by your health care provider. Make sure you discuss any  questions you have with your health care provider. °Document Revised: 09/19/2017 Document Reviewed: 09/19/2017 °Elsevier Patient Education © 2021 [employer] Inc. ° ° ° °Moderate Conscious Sedation, Adult, Care After °This sheet gives you information about how to care for yourself after your procedure. Your health care provider may also give you more specific instructions. If you have problems or questions, contact your health care provider. °What can I expect after the procedure? °After the procedure, it is common to have: °Sleepiness for several hours. °Impaired judgment for several hours. °Difficulty with balance. °Vomiting if you eat too soon. °Follow these instructions at home: °For the time period you were told by your health care provider: °Rest. °Do not participate in activities where you could fall or become injured. °Do not drive or use machinery. °Do not drink alcohol. °Do not take sleeping pills or medicines that cause drowsiness. °Do not make important decisions or sign legal documents. °Do not take care of children on your own.  °  °  °Eating and drinking °Follow the diet recommended by your health care provider. °Drink enough fluid to keep your urine pale yellow. °If you vomit: °Drink water, juice, or soup when you can drink without vomiting. °Make sure you have little or no nausea before eating solid foods.   °General instructions °Take over-the-counter and prescription medicines only as told by your health care provider. °Have a responsible adult stay with you for the time you are told. It is important to have someone help care for you until you are awake and alert. °Do not smoke. °Keep all follow-up visits as told by your health care provider. This is important. °Contact a health care provider if: °You are still sleepy or having trouble with balance after 24 hours. °You feel light-headed. °You keep feeling nauseous or you keep vomiting. °You develop a rash. °You have a fever. °You have redness or  swelling around the IV site. °Get help right away if: °You have trouble breathing. °You have new-onset confusion at home. °Summary °After the procedure, it is common to feel sleepy, have impaired judgment, or feel nauseous if you eat too soon. °Rest after you get home. Know the things you should not do after the procedure. °Follow the diet recommended by your health care provider and drink enough fluid to keep your urine pale yellow. °Get help right away if you have trouble breathing or new-onset confusion at home. °This information is not intended to replace advice given to you by your health care provider. Make sure you discuss any questions you have with your health care provider. °Document Revised: 06/21/2019 Document Reviewed: 01/17/2019 °Elsevier Patient Education © 2021 [employer]vier Inc.  °

## 2021-03-19 NOTE — Progress Notes (Signed)
Patient returns from IR after PAC placed. IV to left AC is infiltrated. IV removed and pressure dressing of 2x2 with coban applied. Patient denies pain. Ice applied.

## 2021-03-22 ENCOUNTER — Telehealth: Payer: Self-pay | Admitting: *Deleted

## 2021-03-22 NOTE — Telephone Encounter (Signed)
Spoke with sister. Informed of information from Dr Julien Nordmann. They will call prescribing physician.

## 2021-03-22 NOTE — Telephone Encounter (Signed)
Pt states he sits on the side of the bed and worrying about everything. Xanax wears off after 1-2 hours. States he does not sleep more than 2 hours each night.

## 2021-03-23 ENCOUNTER — Ambulatory Visit: Payer: Medicare Other | Admitting: Radiation Oncology

## 2021-03-23 ENCOUNTER — Ambulatory Visit: Payer: Medicare Other

## 2021-03-23 ENCOUNTER — Inpatient Hospital Stay: Payer: Medicare Other

## 2021-03-23 ENCOUNTER — Other Ambulatory Visit: Payer: Self-pay

## 2021-03-23 DIAGNOSIS — Z5189 Encounter for other specified aftercare: Secondary | ICD-10-CM | POA: Diagnosis not present

## 2021-03-23 DIAGNOSIS — C3412 Malignant neoplasm of upper lobe, left bronchus or lung: Secondary | ICD-10-CM

## 2021-03-23 DIAGNOSIS — Z5111 Encounter for antineoplastic chemotherapy: Secondary | ICD-10-CM | POA: Diagnosis not present

## 2021-03-23 LAB — CMP (CANCER CENTER ONLY)
ALT: 13 U/L (ref 0–44)
AST: 12 U/L — ABNORMAL LOW (ref 15–41)
Albumin: 3.8 g/dL (ref 3.5–5.0)
Alkaline Phosphatase: 105 U/L (ref 38–126)
Anion gap: 9 (ref 5–15)
BUN: 20 mg/dL (ref 8–23)
CO2: 21 mmol/L — ABNORMAL LOW (ref 22–32)
Calcium: 9.6 mg/dL (ref 8.9–10.3)
Chloride: 106 mmol/L (ref 98–111)
Creatinine: 1.1 mg/dL (ref 0.61–1.24)
GFR, Estimated: >60 mL/min (ref >60)
Glucose, Bld: 141 mg/dL — ABNORMAL HIGH (ref 70–99)
Potassium: 4.1 mmol/L (ref 3.5–5.1)
Sodium: 136 mmol/L (ref 135–145)
Total Bilirubin: 0.4 mg/dL (ref 0.3–1.2)
Total Protein: 7.4 g/dL (ref 6.5–8.1)

## 2021-03-23 LAB — CBC WITH DIFFERENTIAL (CANCER CENTER ONLY)
Abs Immature Granulocytes: 0.48 10*3/uL — ABNORMAL HIGH (ref 0.00–0.07)
Basophils Absolute: 0.1 10*3/uL (ref 0.0–0.1)
Basophils Relative: 1 %
Eosinophils Absolute: 0 10*3/uL (ref 0.0–0.5)
Eosinophils Relative: 1 %
HCT: 33.7 % — ABNORMAL LOW (ref 39.0–52.0)
Hemoglobin: 11.1 g/dL — ABNORMAL LOW (ref 13.0–17.0)
Immature Granulocytes: 11 %
Lymphocytes Relative: 30 %
Lymphs Abs: 1.3 10*3/uL (ref 0.7–4.0)
MCH: 28.4 pg (ref 26.0–34.0)
MCHC: 32.9 g/dL (ref 30.0–36.0)
MCV: 86.2 fL (ref 80.0–100.0)
Monocytes Absolute: 0.4 10*3/uL (ref 0.1–1.0)
Monocytes Relative: 10 %
Neutro Abs: 2 10*3/uL (ref 1.7–7.7)
Neutrophils Relative %: 47 %
Platelet Count: 109 10*3/uL — ABNORMAL LOW (ref 150–400)
RBC: 3.91 MIL/uL — ABNORMAL LOW (ref 4.22–5.81)
RDW: 20 % — ABNORMAL HIGH (ref 11.5–15.5)
Smear Review: NORMAL
WBC Count: 4.3 10*3/uL (ref 4.0–10.5)
nRBC: 0.5 % — ABNORMAL HIGH (ref 0.0–0.2)

## 2021-03-24 ENCOUNTER — Ambulatory Visit: Payer: Medicare Other

## 2021-03-25 ENCOUNTER — Ambulatory Visit: Payer: Medicare Other

## 2021-03-26 ENCOUNTER — Ambulatory Visit: Payer: Medicare Other

## 2021-03-26 ENCOUNTER — Ambulatory Visit
Admission: RE | Admit: 2021-03-26 | Discharge: 2021-03-26 | Disposition: A | Discharge status: Home / Self Care | Payer: Medicare Other | Source: Ambulatory Visit | Attending: Radiation Oncology | Admitting: Radiation Oncology

## 2021-03-26 ENCOUNTER — Other Ambulatory Visit: Payer: Self-pay

## 2021-03-26 ENCOUNTER — Ambulatory Visit (HOSPITAL_COMMUNITY)
Admission: RE | Admit: 2021-03-26 | Discharge: 2021-03-26 | Disposition: A | Discharge status: Home / Self Care | Payer: Medicare Other | Source: Ambulatory Visit | Attending: Physician Assistant | Admitting: Physician Assistant

## 2021-03-26 DIAGNOSIS — C3412 Malignant neoplasm of upper lobe, left bronchus or lung: Secondary | ICD-10-CM | POA: Insufficient documentation

## 2021-03-26 DIAGNOSIS — R911 Solitary pulmonary nodule: Secondary | ICD-10-CM | POA: Diagnosis not present

## 2021-03-26 DIAGNOSIS — F17210 Nicotine dependence, cigarettes, uncomplicated: Secondary | ICD-10-CM | POA: Diagnosis not present

## 2021-03-26 DIAGNOSIS — I700 Atherosclerosis of aorta: Secondary | ICD-10-CM | POA: Diagnosis not present

## 2021-03-26 DIAGNOSIS — C3490 Malignant neoplasm of unspecified part of unspecified bronchus or lung: Secondary | ICD-10-CM | POA: Diagnosis not present

## 2021-03-26 DIAGNOSIS — J439 Emphysema, unspecified: Secondary | ICD-10-CM | POA: Diagnosis not present

## 2021-03-26 DIAGNOSIS — R918 Other nonspecific abnormal finding of lung field: Secondary | ICD-10-CM | POA: Diagnosis not present

## 2021-03-26 MED ORDER — HEPARIN SOD (PORK) LOCK FLUSH 100 UNIT/ML IV SOLN
INTRAVENOUS | Status: AC
  Filled 2021-03-26: qty 5

## 2021-03-26 MED ORDER — HEPARIN SOD (PORK) LOCK FLUSH 100 UNIT/ML IV SOLN
500.0000 [IU] | Freq: Once | INTRAVENOUS | Status: AC
  Administered 2021-03-26: 500 [IU] via INTRAVENOUS

## 2021-03-26 MED ORDER — SODIUM CHLORIDE (PF) 0.9 % IJ SOLN
INTRAMUSCULAR | Status: AC
  Filled 2021-03-26: qty 50

## 2021-03-26 MED ORDER — IOHEXOL 300 MG/ML  SOLN
75.0000 mL | Freq: Once | INTRAMUSCULAR | Status: AC | PRN
  Administered 2021-03-26: 75 mL via INTRAVENOUS

## 2021-03-29 ENCOUNTER — Ambulatory Visit: Payer: Medicare Other | Admitting: Radiation Oncology

## 2021-03-29 ENCOUNTER — Telehealth: Payer: Self-pay | Admitting: Medical Oncology

## 2021-03-29 ENCOUNTER — Ambulatory Visit: Payer: Medicare Other

## 2021-03-29 ENCOUNTER — Telehealth: Payer: Self-pay | Admitting: Radiation Oncology

## 2021-03-29 DIAGNOSIS — C3412 Malignant neoplasm of upper lobe, left bronchus or lung: Secondary | ICD-10-CM

## 2021-03-29 NOTE — Telephone Encounter (Signed)
Pt upset /humiliated that he has to wear a bunny suit  for xrt treatment . He does not want to come back.  Call transferred to radiation.

## 2021-03-29 NOTE — Telephone Encounter (Signed)
We spoke with his sister and surrogate decision maker Jacorey Donaway about the findings of bed bugs during simulation. We will reach out to social work about getting a terminex treatment. He is also very fatigued and resistant to proceeding with radiation or any treatment at all. We discussed referral to hospice and palliative care for goals of care. We will keep appts scheduled as is until otherwise noted.

## 2021-03-30 ENCOUNTER — Ambulatory Visit: Payer: Medicare Other

## 2021-03-30 ENCOUNTER — Telehealth: Payer: Self-pay | Admitting: Medical Oncology

## 2021-03-30 ENCOUNTER — Ambulatory Visit: Payer: Medicare Other | Admitting: Internal Medicine

## 2021-03-30 ENCOUNTER — Other Ambulatory Visit: Payer: Medicare Other

## 2021-03-30 NOTE — Telephone Encounter (Signed)
Reginald Berry said Hospice is coming to see him tomorrow.  [place] and [employer] agency [facility]) can  help him with place to stay.  Reginald Berry is asking to talk to someone in the support center. Message sent to [place].

## 2021-03-31 ENCOUNTER — Other Ambulatory Visit: Payer: Self-pay

## 2021-03-31 ENCOUNTER — Ambulatory Visit: Payer: Medicare Other

## 2021-03-31 ENCOUNTER — Encounter: Payer: Self-pay | Admitting: Radiation Oncology

## 2021-03-31 ENCOUNTER — Inpatient Hospital Stay (HOSPITAL_BASED_OUTPATIENT_CLINIC_OR_DEPARTMENT_OTHER): Payer: Medicare Other | Admitting: Internal Medicine

## 2021-03-31 ENCOUNTER — Telehealth: Payer: Self-pay | Admitting: *Deleted

## 2021-03-31 DIAGNOSIS — C3412 Malignant neoplasm of upper lobe, left bronchus or lung: Secondary | ICD-10-CM

## 2021-03-31 DIAGNOSIS — Z5111 Encounter for antineoplastic chemotherapy: Secondary | ICD-10-CM

## 2021-03-31 NOTE — Telephone Encounter (Signed)
Left a message for the patient's sister [name] to let her know we received a message from Dr. Worthy Flank office informing us that Sion does not want to have any further treatments and will be going with Hospice.  I left my call back number in the event she has any concerns or questions, and I will make sure his upcoming radiation treatments are canceled.  Gloriajean Dell. Leonie Green, BSN

## 2021-03-31 NOTE — Progress Notes (Signed)
Reginald Berry [phone]   [phone]  PROGRESS NOTE FOR TELEMEDICINE VISITS  [name], MD [name] [id number]  I connected withNAME@ on 03/31/21 at 10:30 AM EST by telephone visit and verified that I am speaking with the correct person using two identifiers.   I discussed the limitations, risks, security and privacy concerns of performing an evaluation and management service by telemedicine and the availability of in-person appointments. I also discussed with the patient that there may be a patient responsible charge related to this service. The patient expressed understanding and agreed to proceed.  Other persons participating in the visit and their role in the encounter: Sister  Patient's location: Home Provider's location: [place].  DIAGNOSIS:  1) Limited stage (T3, N0, M0) small cell lung cancer diagnosed in November 2022. 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  Diagnosed in 2019. 3) stage Ia3 (T1cN0M0) non-small cell lung cancer.  Diagnosed with a left suprahilar region with AP window adenopathy.  Status post SBRT 4) renal cell carcinoma status post CT-guided ablation in July 2020.  Followed by Dr. [name]. 5) recurrent non-small cell lung cancer in July 2021.  Patient presented with new adenopathy in the AP window. Inaccessible to biopsy   PRIOR THERAPY:  1) SBRT to the LUL nodule in 2019 under the care of Dr. Lisbeth Renshaw 2) SBRT to the left suprahilar mass under the care of Dr. Lisbeth Renshaw in March 2020 3) CT guided ablation to the renal cell carcinoma under the care of Dr. Kathlene Cote. 40 Concurrent chemoradiation with carboplatin for an AUC of 2 and paclitaxel 45 mg/m.  First dose on 09/30/2019.  Status post 6 cycles.  Last cycle was given on 11/12/2019 with partial response. 4) Consolidation treatment with immunotherapy with Imfinzi 1500 mg IV every 4 weeks.   First dose December 18, 2019.  Status post 13 cycles. 5) Systemic chemotherapy with carboplatin for AUC of 5 on day 1 and 2 etoposide 100 Mg/M2 on days 1, 2 and 3 concurrent with radiotherapy.  First dose February 17, 2021. Status post 2 cycles.    CURRENT THERAPY: Palliative and hospice care.  INTERVAL HISTORY: Reginald Berry 71 y.o. male has a telephone virtual visit with me today for evaluation and recommendation regarding his condition.  The patient is complaining of increasing fatigue and weakness as well as difficult mobility at home.  He also has the baseline shortness of breath increased with exertion with cough and no hemoptysis.  He denied having any chest pain.  He denied having any recent weight loss or night sweats.  He has no headache or visual changes.  He has no nausea, vomiting, diarrhea or constipation.  He was found during his recent radiotherapy to have bedbugs.  The patient tolerated the second cycle of his systemic chemotherapy with carboplatin and etoposide fairly well.  He had repeat CT scan of the chest performed recently and we are having the visit for discussion of his scan results and treatment options.  MEDICAL HISTORY: Past Medical History:  Diagnosis Date   Anxiety    Aortic atherosclerosis (HCC)    Arthritis    Chronic low back pain    COPD (chronic obstructive pulmonary disease) (HCC)    Coronary artery calcification seen on CT scan    Multivessel   Depression    Essential hypertension    Gait instability    Previous logging  accident   GERD (gastroesophageal reflux disease)    Lung cancer ([place])    Poorly differentiated, XRT   Renal cell carcinoma ([place])    Left, status post cryoablation August 2020    ALLERGIES:  has No Known Allergies.  MEDICATIONS:  Current Outpatient Medications  Medication Sig Dispense Refill   albuterol (PROVENTIL) (2.5 MG/3ML) 0.083% nebulizer solution Take 3 mLs (2.5 mg total) by nebulization every 6 (six) hours as needed for  wheezing or shortness of breath. 360 mL 11   ALPRAZolam (XANAX) 0.5 MG tablet Take 0.5 mg by mouth at bedtime.     amLODipine (NORVASC) 10 MG tablet Take 10 mg by mouth daily.     aspirin 81 MG EC tablet Take 81 mg by mouth daily. Swallow whole.     buPROPion (WELLBUTRIN XL) 300 MG 24 hr tablet Take 300 mg by mouth daily.     busPIRone (BUSPAR) 15 MG tablet Take 15 mg by mouth daily.     cetirizine (ZYRTEC) 10 MG tablet Take 10 mg by mouth daily.     Fluticasone-Umeclidin-Vilant (TRELEGY ELLIPTA) 100-62.5-25 MCG/ACT AEPB Inhale 1 puff into the lungs daily. 60 each 0   Fluticasone-Umeclidin-Vilant (TRELEGY ELLIPTA) 100-62.5-25 MCG/ACT AEPB Inhale 1 puff into the lungs daily. 60 each 3   furosemide (LASIX) 40 MG tablet Take 40 mg by mouth daily.      hydrochlorothiazide (MICROZIDE) 12.5 MG capsule Take 12.5 mg by mouth daily.     lidocaine-prilocaine (EMLA) cream Apply 1 application topically as needed. 30 g 2   liothyronine (CYTOMEL) 25 MCG tablet Take 25 mcg by mouth daily.     losartan (COZAAR) 100 MG tablet Take 100 mg by mouth daily.     meclizine (ANTIVERT) 12.5 MG tablet Take 12.5 mg by mouth 3 (three) times daily.     Multiple Vitamins-Minerals (CENTRUM SILVER 50+MEN) TABS Take 1 tablet by mouth daily.     oxyCODONE-acetaminophen (PERCOCET) 10-325 MG tablet Take 1 tablet by mouth 4 (four) times daily as needed for pain.     pantoprazole (PROTONIX) 40 MG tablet Take 40 mg by mouth every evening.      Probiotic Product (PROBIOTIC DAILY PO) Take 1 capsule by mouth daily.     prochlorperazine (COMPAZINE) 10 MG tablet TAKE ONE TABLET EVERY 6 HOURS AS NEEDED 30 tablet 2   prochlorperazine (COMPAZINE) 10 MG tablet Take 1 tablet (10 mg total) by mouth every 6 (six) hours as needed for nausea or vomiting. 30 tablet 0   rosuvastatin (CRESTOR) 5 MG tablet Take 5 mg by mouth at bedtime.     sulfamethoxazole-trimethoprim (BACTRIM DS) 800-160 MG tablet Take 1 tablet by mouth 2 (two) times daily. 14  tablet 0   No current facility-administered medications for this visit.    SURGICAL HISTORY:  Past Surgical History:  Procedure Laterality Date   BRONCHIAL BIOPSY  01/27/2021   Procedure: BRONCHIAL BIOPSIES;  Surgeon: Garner Nash, DO;  Location: [facility];  Service: Pulmonary;;   BRONCHIAL BRUSHINGS  01/27/2021   Procedure: BRONCHIAL BRUSHINGS;  Surgeon: Garner Nash, DO;  Location: [facility];  Service: Pulmonary;;   BRONCHIAL NEEDLE ASPIRATION BIOPSY  01/27/2021   Procedure: BRONCHIAL NEEDLE ASPIRATION BIOPSIES;  Surgeon: Garner Nash, DO;  Location: [facility];  Service: Pulmonary;;   CHOLECYSTECTOMY     2019   FRACTURE SURGERY     bil legs (logging trees)   IR IMAGING GUIDED PORT INSERTION  03/19/2021   IR RADIOLOGIST EVAL & MGMT  09/27/2018   IR RADIOLOGIST EVAL & MGMT  11/22/2018   IR RADIOLOGIST EVAL & MGMT  04/10/2019   IR RADIOLOGIST EVAL & MGMT  10/13/2020   NECK SURGERY  1994-1995   RADIOLOGY WITH ANESTHESIA Left 10/31/2018   Procedure: CT WITH ANESTHESIA RENAL CRYO ABLATION;  Surgeon: Aletta Edouard, MD;  Location: [facility];  Service: Radiology;  Laterality: Left;   VIDEO BRONCHOSCOPY WITH ENDOBRONCHIAL ULTRASOUND Bilateral 01/27/2021   Procedure: VIDEO BRONCHOSCOPY WITH ENDOBRONCHIAL ULTRASOUND;  Surgeon: Garner Nash, DO;  Location: [facility];  Service: Pulmonary;  Laterality: Bilateral;   VIDEO BRONCHOSCOPY WITH RADIAL ENDOBRONCHIAL ULTRASOUND  01/27/2021   Procedure: VIDEO BRONCHOSCOPY WITH RADIAL ENDOBRONCHIAL ULTRASOUND;  Surgeon: Garner Nash, DO;  Location: [facility];  Service: Pulmonary;;    REVIEW OF SYSTEMS:  A comprehensive review of systems was negative except for: Constitutional: positive for fatigue Respiratory: positive for cough and dyspnea on exertion Musculoskeletal: positive for arthralgias and muscle weakness     LABORATORY DATA: Lab Results  Component Value Date   WBC 4.3 03/23/2021   HGB 11.1 (L) 03/23/2021   HCT  33.7 (L) 03/23/2021   MCV 86.2 03/23/2021   PLT 109 (L) 03/23/2021      Chemistry      Component Value Date/Time   NA 136 03/23/2021 1128   K 4.1 03/23/2021 1128   CL 106 03/23/2021 1128   CO2 21 (L) 03/23/2021 1128   BUN 20 03/23/2021 1128   CREATININE 1.10 03/23/2021 1128      Component Value Date/Time   CALCIUM 9.6 03/23/2021 1128   ALKPHOS 105 03/23/2021 1128   AST 12 (L) 03/23/2021 1128   ALT 13 03/23/2021 1128   BILITOT 0.4 03/23/2021 1128       RADIOGRAPHIC STUDIES: CT Chest W Contrast  Result Date: 03/26/2021 CLINICAL DATA:  Primary Cancer Type: Lung Imaging Indication: Assess response to therapy Interval therapy since last imaging? Yes Initial Cancer Diagnosis Date: 07/2017; Established by: Biopsy-proven Detailed Pathology: Limited stage small cell lung cancer. Original diagnosis of stage Ib non-small cell lung cancer, adenosquamous carcinoma 2019. Primary Tumor location:  Left upper lobe. Recurrence?  Yes Date(s) of recurrence: 01/27/2021; Established by: Biopsy-proven; Limited stage small cell lung cancer. Date(s) of recurrence: 09/11/2019; Established by: Imaging only; Stage IIIa non-small cell lung cancer. Surgeries: Cholecystectomy. Chemotherapy: Yes; Ongoing? Yes; Most recent administration: 03/09/2021 Immunotherapy?  Yes; Type: Imfinzi; Ongoing? No Radiation therapy? Yes Date Range: Current; Target: Left lung Date Range: 10/01/2019 - 11/13/2019; Target: Left mediastinum Date Range: 06/26/2018 - 07/06/2018; Target: Left suprahilar region Date Range: 08/30/2017 - 09/08/2017; Target: Left upper lobe Other Cancer Therapies: Cryoablation of left renal cell carcinoma 10/31/2018. EXAM: CT CHEST WITH CONTRAST TECHNIQUE: Multidetector CT imaging of the chest was performed during intravenous contrast administration. RADIATION DOSE REDUCTION: This exam was performed according to the departmental dose-optimization program which includes automated exposure control, adjustment of the mA  and/or kV according to patient size and/or use of iterative reconstruction technique. CONTRAST:  57mL OMNIPAQUE IOHEXOL 300 MG/ML  SOLN COMPARISON:  01/04/2021 PET-CT.  Most recent CT chest 12/17/2020. FINDINGS: Cardiovascular: Normal heart size. Aortic atherosclerosis and coronary artery calcifications. No pericardial effusion. Mediastinum/Nodes: Thyroid gland, trachea, and esophagus demonstrate no significant findings. Index left superior mediastinal lymph node measures 6 mm, image 18/3. Formally 10 mm. Index subcarinal lymph node measures 0.9 cm, image 73/3.  Unchanged. Lungs/Pleura: No pleural effusion. Moderate centrilobular and paraseptal emphysema. No acute airspace consolidation, atelectasis or pneumothorax. The recurrent tumor within the  posterolateral aspect of the left upper lobe measures 6.1 x 4.3 cm, image 34. This is compared with 6.0 x 4.0 cm on the previous CT. Within the more central aspect of the left upper lobe in the area of radiation change there is progressive solid masslike architectural distortion measuring 8.1 x 4.6 cm, image 41/6. On the CT from 12/17/2020 this area measured 6.8 x 3.6 cm. Although some of these changes may reflect sequelae of underlying external beam radiation, the appearance is suspicious for progression of disease. Thin rind of increased pleural soft tissue extending from the peripheral lung mass into the left apex is identified measuring 5 mm in thickness, image 23/3. On the previous exam this area measured 4 mm in thickness. No new sites of disease identified within the lungs. Upper Abdomen: No acute abnormality within the imaged portions of the upper abdomen. Ablation zone defect within the posterior cortex of the left mid kidney appears unchanged, image 176/3. Bilateral low-attenuation adrenal nodules appear stable from previous exam. As mentioned previously these are favored to represent benign adenomas. Musculoskeletal: Signs of left posterior chest wall involvement  with pathologic fractures involving the posterior aspect of the left third and fourth ribs is again noted. No signs of distant osseous metastasis within the bony thorax. IMPRESSION: 1. Interval progression of disease. The recurrent tumor within the posterolateral aspect of the left upper lobe has increased in size in the interval. Additionally, there is progressive solid masslike architectural distortion within the more central aspect of the left upper lobe in the area of radiation change. 2. Similar appearance of left posterior chest wall involvement with pathologic fractures involving the posterior aspect of the left third and fourth ribs. 3. Aortic Atherosclerosis (ICD10-I70.0) and Emphysema (ICD10-J43.9). Electronically Signed   By: Kerby Moors M.D.   On: 03/26/2021 11:22   IR IMAGING GUIDED PORT INSERTION  Result Date: 03/19/2021 CLINICAL DATA:  Lung cancer, access for chemotherapy EXAM: RIGHT INTERNAL JUGULAR SINGLE LUMEN POWER PORT CATHETER INSERTION Date:  03/19/2021 03/19/2021 2:35 pm Radiologist:  M. Daryll Brod, MD Guidance:  Ultrasound and fluoroscopic MEDICATIONS: 1% lidocaine local ANESTHESIA/SEDATION: Versed 3.0 mg IV; Fentanyl 100 mcg IV; Moderate Sedation Time:  27 The patient was continuously monitored during the procedure by the interventional radiology nurse under my direct supervision. FLUOROSCOPY TIME:  0 minutes, 42 seconds (9 mGy) COMPLICATIONS: None immediate. CONTRAST:  None. PROCEDURE: Informed consent was obtained from the patient following explanation of the procedure, risks, benefits and alternatives. The patient understands, agrees and consents for the procedure. All questions were addressed. A time out was performed. Maximal barrier sterile technique utilized including caps, mask, sterile gowns, sterile gloves, large sterile drape, hand hygiene, and 2% chlorhexidine scrub. Under sterile conditions and local anesthesia, right internal jugular micropuncture venous access was  performed. Access was performed with ultrasound. Images were obtained for documentation of the patent right internal jugular vein. A guide wire was inserted followed by a transitional dilator. This allowed insertion of a guide wire and catheter into the IVC. Measurements were obtained from the SVC / RA junction back to the right IJ venotomy site. In the right infraclavicular chest, a subcutaneous pocket was created over the second anterior rib. This was done under sterile conditions and local anesthesia. 1% lidocaine with epinephrine was utilized for this. A 2.5 cm incision was made in the skin. Blunt dissection was performed to create a subcutaneous pocket over the right pectoralis major muscle. The pocket was flushed with saline vigorously. There was adequate  hemostasis. The port catheter was assembled and checked for leakage. The port catheter was secured in the pocket with two retention sutures. The tubing was tunneled subcutaneously to the right venotomy site and inserted into the SVC/RA junction through a valved peel-away sheath. Position was confirmed with fluoroscopy. Images were obtained for documentation. The patient tolerated the procedure well. No immediate complications. Incisions were closed in a two layer fashion with 4 - 0 Vicryl suture. Dermabond was applied to the skin. The port catheter was accessed, blood was aspirated followed by saline and heparin flushes. Needle was removed. A dry sterile dressing was applied. IMPRESSION: Ultrasound and fluoroscopically guided right internal jugular single lumen power port catheter insertion. Tip in the SVC/RA junction. Catheter ready for use. Electronically Signed   By: Jerilynn Mages.  Shick M.D.   On: 03/19/2021 14:56    ASSESSMENT AND PLAN: This is a very pleasant 71 years old white male with  1) recurrent non-small cell lung cancer presenting as a stage IIIa with mediastinal lymphadenopathy in July 2021.  The patient has a history of early stage non-small cell lung  cancer, adenosquamous carcinoma as stage Ib of the left upper lobe status post SBRT in 2019.  He also has a stage Ia3 non-small cell lung cancer status post SBRT to the left suprahilar region and AP window adenopathy. He completed a course of concurrent chemoradiation with weekly carboplatin and paclitaxel status post 6 cycles and tolerated his treatment well. The patient completed treatment with immunotherapy with Imfinzi 1500 mg IV every 4 weeks status post 13 cycles.    2) limited stage (T3, N0, M0) small cell lung cancer diagnosed in December 2022. He is currently undergoing a course of systemic chemotherapy with carboplatin for AUC of 5 on day 1, etoposide 100 Mg/M2 on days 1, 2 and 3 for 4 cycles.  This will be concurrent with radiotherapy.  Status post 2 cycles of treatment started on February 17, 2021. The patient tolerated the second cycle of his treatment fairly well but has more fatigue and weakness He had repeat CT scan of the chest performed recently. I personally and independently reviewed the scan images and discussed the results with the patient today.  Unfortunately his scan showed interval progression of his disease with increase in the size of the recurrent tumor in the posterior lateral aspect of the left lower lobe. I had a lengthy discussion with the patient about his condition and treatment options.  The patient and his sister had already made the decision not to proceed with any additional therapy and they deferred palliative care and hospice at this point. I assured the patient that I will be happy to take care of any of his hospice need during this period. I will see him on as-needed basis at this point. He was advised to call if he has any concerning issues in the interval.  I discussed the assessment and treatment plan with the patient. The patient was provided an opportunity to ask questions and all were answered. The patient agreed with the plan and demonstrated an  understanding of the instructions.   The patient was advised to call back or seek an in-person evaluation if the symptoms worsen or if the condition fails to improve as anticipated.  I provided 15 minutes of non face-to-face telephone visit time during this encounter, and > 50% was spent counseling as documented under my assessment & plan.  Eilleen Kempf, MD 03/31/2021 11:01 AM  Disclaimer: This note was dictated with  voice recognition software. Similar sounding words can inadvertently be transcribed and may not be corrected upon review.

## 2021-04-01 ENCOUNTER — Ambulatory Visit: Payer: Medicare Other

## 2021-04-01 ENCOUNTER — Ambulatory Visit: Payer: Medicare Other | Admitting: Radiation Oncology

## 2021-04-01 ENCOUNTER — Encounter: Payer: Self-pay | Admitting: Internal Medicine

## 2021-04-01 NOTE — Progress Notes (Signed)
°  Radiation Oncology         [phone] ________________________________  Name: Reginald Berry MRN: [id number]  Date: 03/31/2021  DOB: [birth year]  End of Treatment Note  Diagnosis:    Limited Stage Small Cell Carcinoma of the LUL.     Indication for treatment:  palliative       Radiation treatment dates:   n/a  Site/planned dose:   Treatment was originally planned but never administered. His prescription was for 60 Gy in 30 fractions.   Narrative: The patient has a complex history of NSCLC, he has been treated previously for early stage lung cancer with SBRT, and developed more advanced disease and received chemoRT. He recently was diagnosed with limited stage small cell carcinoma in the LUL and was getting set up for radiation to start this week with plans for ongoing chemotherapy. He came for simulation, unfortunately was found to have bed bug infestation and decided that he did not wish to come daily for treatments, and rather focus on quality of life under the care of hospice. A referral had been placed for palliative vs. Hospice eligibility. After meeting, he has decided to proceed with hospice and declined further chemo or radiation.  Plan:  The patient has decided to forgo radiotherapy and pursue hospice care as above. We will see the patient as needed moving forward.     Carola Rhine, PAC

## 2021-04-02 ENCOUNTER — Ambulatory Visit: Payer: Medicare Other

## 2021-04-03 ENCOUNTER — Ambulatory Visit: Payer: Medicare Other

## 2021-04-05 ENCOUNTER — Ambulatory Visit: Payer: Medicare Other

## 2021-04-06 ENCOUNTER — Ambulatory Visit: Payer: Medicare Other

## 2021-04-06 ENCOUNTER — Other Ambulatory Visit: Payer: Medicare Other

## 2021-04-07 ENCOUNTER — Ambulatory Visit: Payer: Medicare Other

## 2021-04-08 ENCOUNTER — Ambulatory Visit: Payer: Medicare Other

## 2021-04-09 ENCOUNTER — Ambulatory Visit: Payer: Medicare Other

## 2021-04-12 ENCOUNTER — Ambulatory Visit: Payer: Medicare Other

## 2021-04-13 ENCOUNTER — Ambulatory Visit: Payer: Medicare Other

## 2021-04-13 ENCOUNTER — Other Ambulatory Visit: Payer: Medicare Other

## 2021-04-14 ENCOUNTER — Ambulatory Visit: Payer: Medicare Other

## 2021-04-15 ENCOUNTER — Ambulatory Visit: Payer: Medicare Other

## 2021-04-16 ENCOUNTER — Ambulatory Visit: Payer: Medicare Other

## 2021-04-19 ENCOUNTER — Ambulatory Visit: Payer: Medicare Other

## 2021-04-20 ENCOUNTER — Ambulatory Visit: Payer: Medicare Other

## 2021-04-20 ENCOUNTER — Ambulatory Visit: Payer: Medicare Other | Admitting: Physician Assistant

## 2021-04-20 ENCOUNTER — Other Ambulatory Visit: Payer: Medicare Other

## 2021-04-21 ENCOUNTER — Ambulatory Visit: Payer: Medicare Other

## 2021-04-22 ENCOUNTER — Ambulatory Visit: Payer: Medicare Other

## 2021-04-23 ENCOUNTER — Ambulatory Visit: Payer: Medicare Other

## 2021-04-24 ENCOUNTER — Ambulatory Visit: Payer: Medicare Other

## 2021-04-26 ENCOUNTER — Ambulatory Visit: Payer: Medicare Other

## 2021-04-27 ENCOUNTER — Other Ambulatory Visit: Payer: Medicare Other

## 2021-04-27 ENCOUNTER — Ambulatory Visit: Payer: Medicare Other

## 2021-04-28 ENCOUNTER — Ambulatory Visit: Payer: Medicare Other

## 2021-04-29 ENCOUNTER — Ambulatory Visit: Payer: Medicare Other

## 2021-04-30 ENCOUNTER — Ambulatory Visit: Payer: Medicare Other

## 2021-05-03 ENCOUNTER — Ambulatory Visit: Payer: Medicare Other

## 2021-05-04 ENCOUNTER — Ambulatory Visit: Payer: Medicare Other

## 2021-05-04 ENCOUNTER — Other Ambulatory Visit: Payer: Medicare Other

## 2021-05-05 ENCOUNTER — Ambulatory Visit: Payer: Medicare Other

## 2021-05-06 ENCOUNTER — Ambulatory Visit: Payer: Medicare Other

## 2021-05-07 ENCOUNTER — Ambulatory Visit: Payer: Medicare Other

## 2021-05-10 ENCOUNTER — Ambulatory Visit: Payer: Medicare Other

## 2021-05-11 ENCOUNTER — Ambulatory Visit: Payer: Medicare Other

## 2021-05-11 ENCOUNTER — Ambulatory Visit: Payer: Self-pay | Admitting: Internal Medicine

## 2021-05-11 ENCOUNTER — Ambulatory Visit: Payer: Commercial Managed Care - HMO

## 2021-05-11 ENCOUNTER — Other Ambulatory Visit: Payer: Commercial Managed Care - HMO

## 2021-05-12 ENCOUNTER — Ambulatory Visit: Payer: Medicare Other

## 2021-05-12 ENCOUNTER — Ambulatory Visit: Payer: Commercial Managed Care - HMO

## 2021-05-13 ENCOUNTER — Ambulatory Visit: Payer: Self-pay

## 2021-05-14 ENCOUNTER — Other Ambulatory Visit: Payer: Self-pay | Admitting: Internal Medicine

## 2021-05-14 MED ORDER — LORAZEPAM 0.5 MG PO TABS: 0.5000 mg | ORAL_TABLET | Freq: Every day | ORAL | 0 refills | Status: DC

## 2021-05-15 ENCOUNTER — Ambulatory Visit: Payer: Commercial Managed Care - HMO

## 2021-05-17 IMAGING — CR DG FOOT COMPLETE 3+V*R*
3 series · 3 of 3 positions shown · non-contrast
Comparison: None.

CLINICAL DATA: Blunt trauma last night with right second toe pain.

EXAM:
RIGHT FOOT COMPLETE - 3+ VIEW

[x foot ap right]
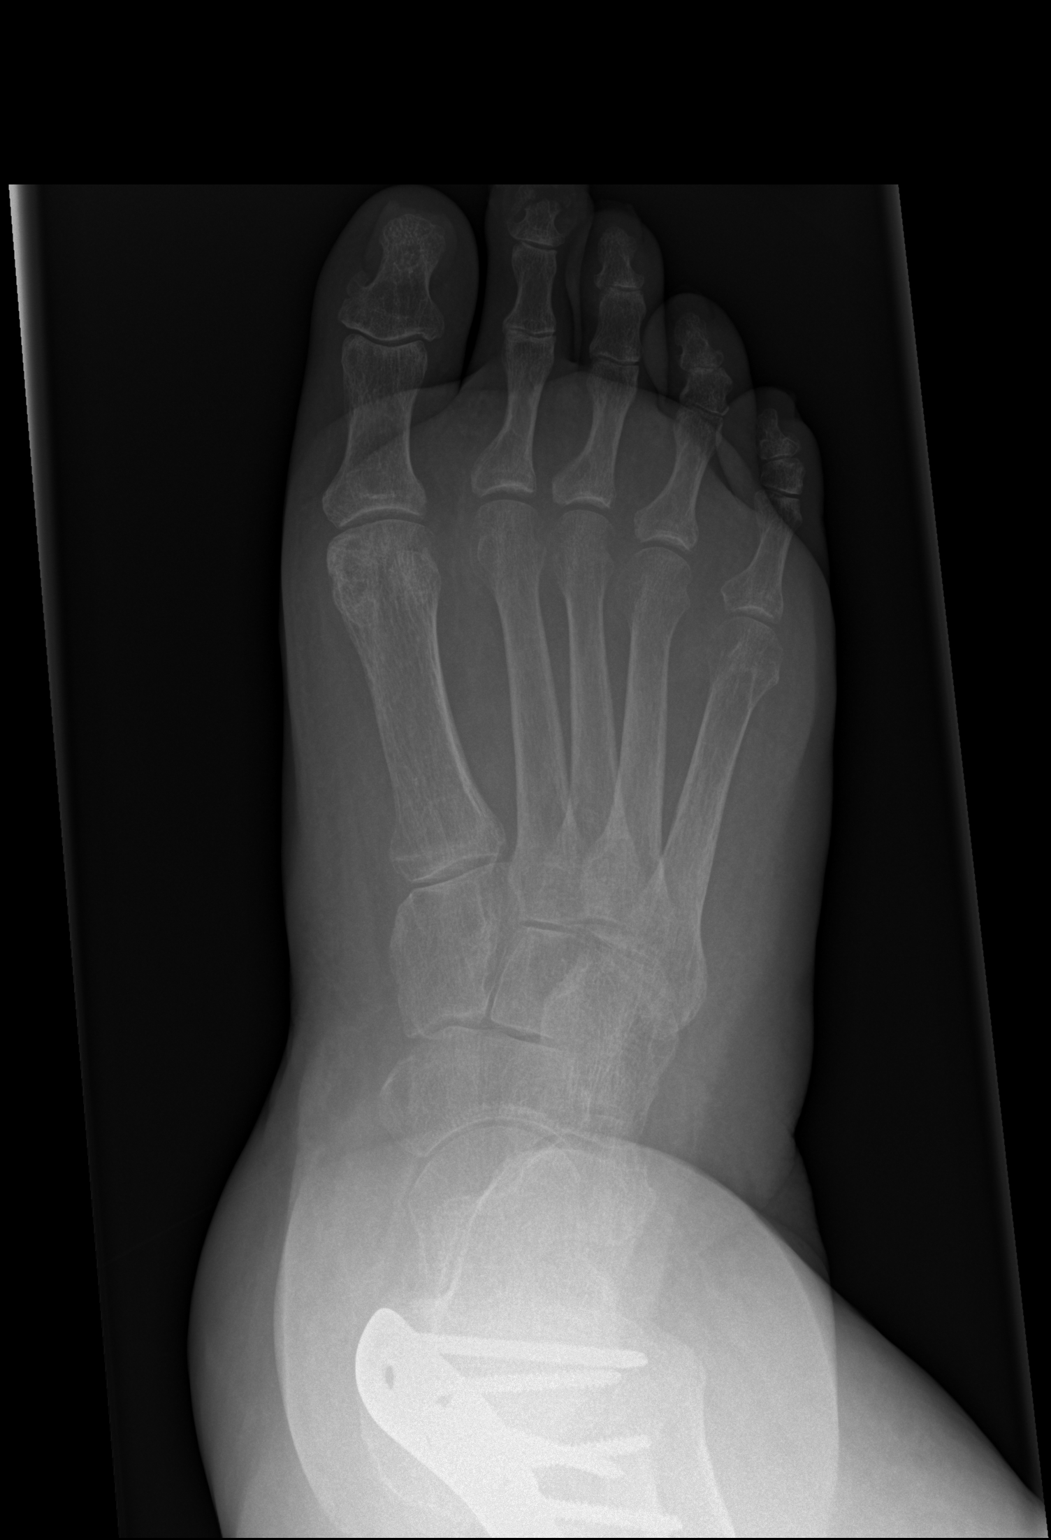

[x foot obl right]
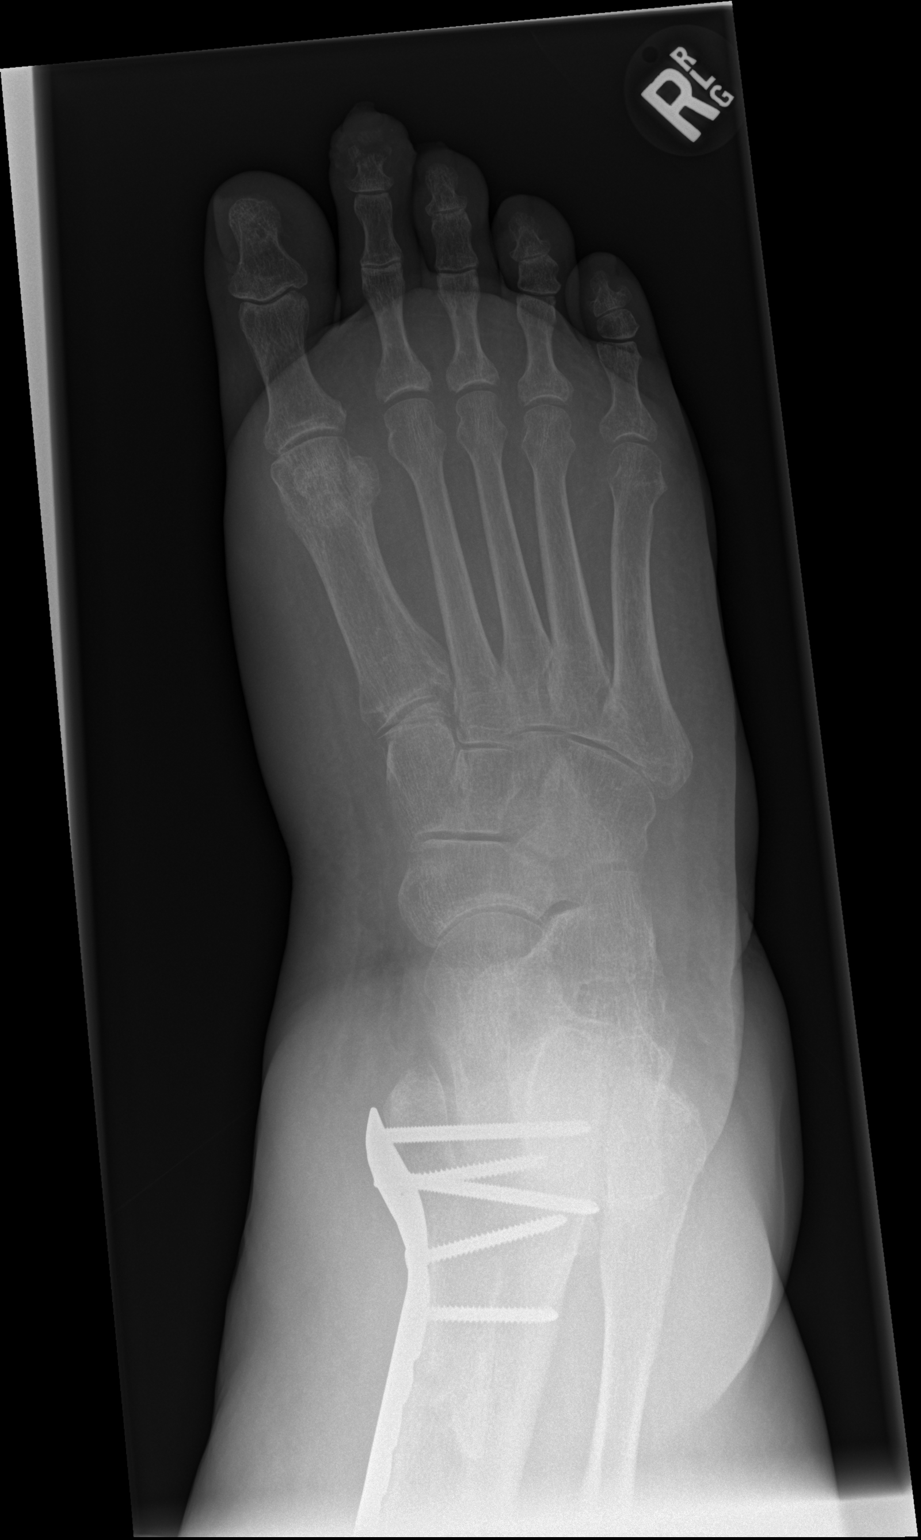

[x foot lat right]
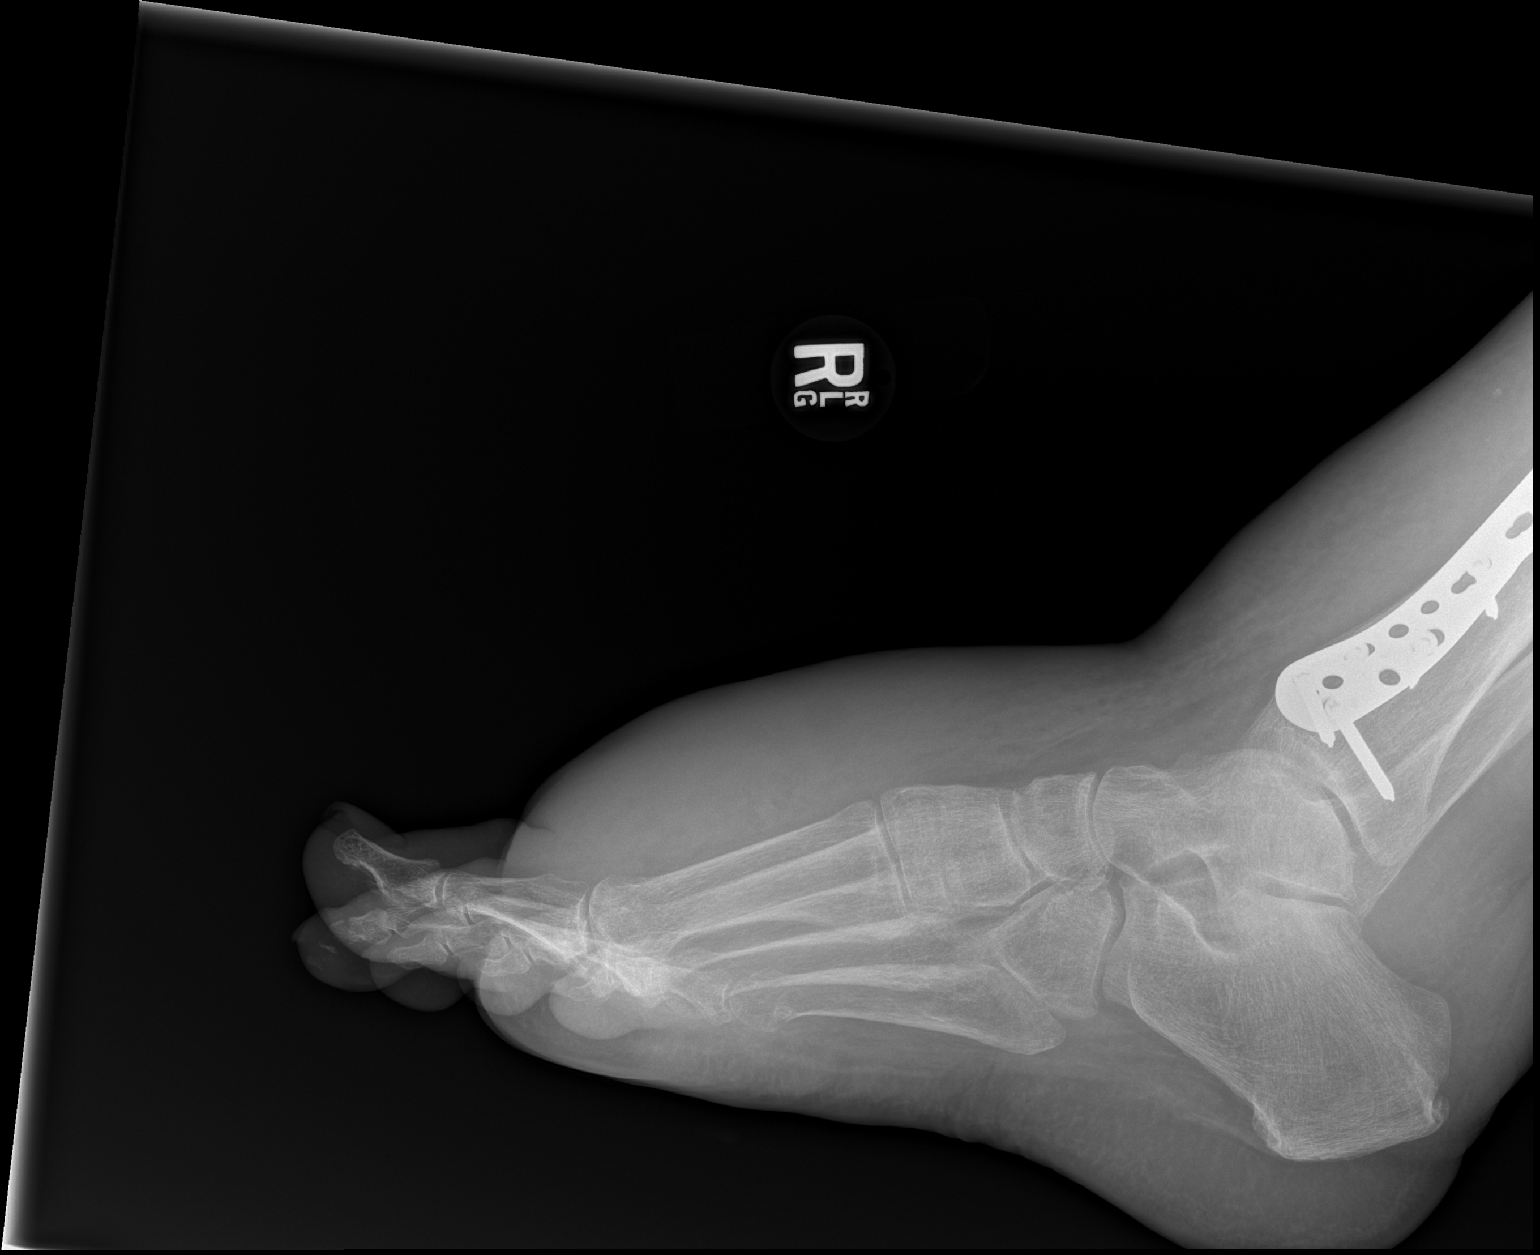

[3 of 3 positions shown; findings below may reference images not displayed]

FINDINGS: Examination demonstrates diffuse decreased bone mineralization.
There is a displaced comminuted fracture of the second distal
phalanx. No additional fractures identified. Minimal degenerate
change over the first MTP joint. Moderate soft tissue swelling over
the dorsum of the mid to forefoot. Partially visualized hardware
intact over the distal tibia.
IMPRESSION: Displaced comminuted fracture of the second distal phalanx.

## 2021-05-18 ENCOUNTER — Other Ambulatory Visit: Payer: Commercial Managed Care - HMO

## 2021-05-25 ENCOUNTER — Other Ambulatory Visit: Payer: Commercial Managed Care - HMO

## 2021-06-01 ENCOUNTER — Other Ambulatory Visit: Payer: Commercial Managed Care - HMO

## 2021-06-01 ENCOUNTER — Ambulatory Visit: Payer: Commercial Managed Care - HMO | Admitting: Physician Assistant

## 2021-06-01 ENCOUNTER — Ambulatory Visit: Payer: Commercial Managed Care - HMO

## 2021-06-02 ENCOUNTER — Ambulatory Visit: Payer: Self-pay

## 2021-06-03 ENCOUNTER — Ambulatory Visit: Payer: Commercial Managed Care - HMO

## 2021-06-05 ENCOUNTER — Ambulatory Visit: Payer: Self-pay

## 2021-07-05 ENCOUNTER — Other Ambulatory Visit: Payer: Self-pay | Admitting: Internal Medicine

## 2021-09-04 DEATH — deceased

## 2021-09-20 ENCOUNTER — Other Ambulatory Visit: Payer: Self-pay | Admitting: Interventional Radiology

## 2021-09-20 DIAGNOSIS — N2889 Other specified disorders of kidney and ureter: Secondary | ICD-10-CM

## 2022-09-19 IMAGING — MR MR HEAD WO/W CM
13 series · 48 of 48 positions shown · IV contrast (gadavist)
Comparison: None.

CLINICAL DATA: Small cell lung cancer. Staging.

EXAM:
MRI HEAD WITHOUT AND WITH CONTRAST
TECHNIQUE: Multiplanar, multiecho pulse sequences of the brain and surrounding
structures were obtained without and with intravenous contrast.
CONTRAST:  10mL GADAVIST GADOBUTROL 1 MMOL/ML IV SOLN

[Series 5: DWI · axial · 3.0mm · 1.36mm/px · z∈[-35,+101]mm · 7 of 104 slices shown (1 of 2)]
[im 1/104]
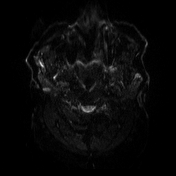
[im 18/104]
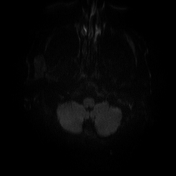
[im 35/104]
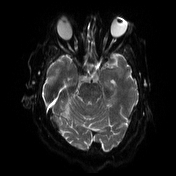
[im 52/104]
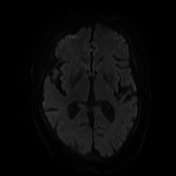
[im 69/104]
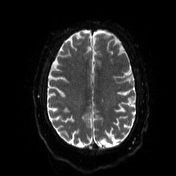
[im 86/104]
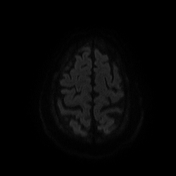
[im 104/104]
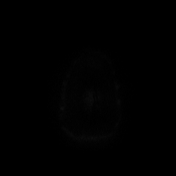

[Series 6: DWI · axial · 3.0mm · 1.36mm/px · z∈[-35,+101]mm · 3 of 52 slices shown (2 of 2)]
[im 1/52]
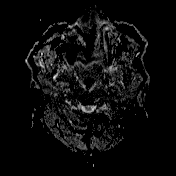
[im 26/52]
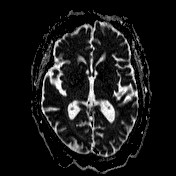
[im 52/52]
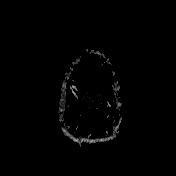

[Series 7: T1 · sagittal · 5.0mm · 0.75mm/px · 1 of 24 slices shown (1 of 2)]
[im 1/24]
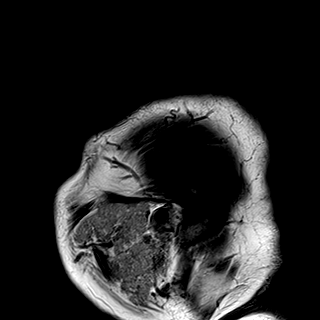

[Series 8: T2 · axial · 5.0mm · 0.62mm/px · 1 of 24 slices shown]
[im 1/24]
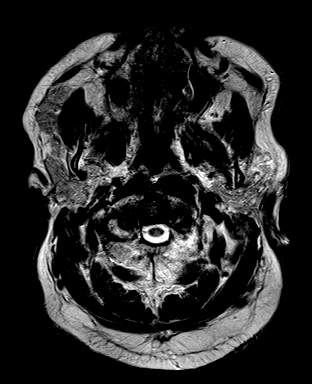

[Series 9: swi_images · axial · 3.0mm · 0.75mm/px · z∈[-46,+111]mm · 4 of 60 slices shown]
[im 1/60]
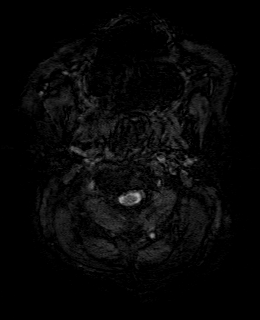
[im 20/60]
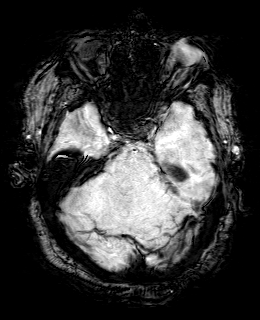
[im 40/60]
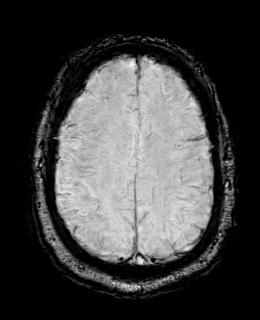
[im 60/60]
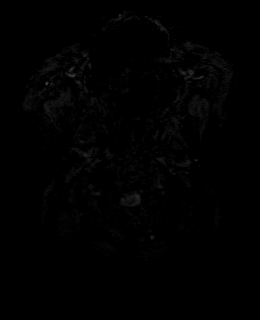

[Series 11: FLAIR · axial · 3.0mm · 0.94mm/px · z∈[-35,+100]mm · 3 of 52 slices shown]
[im 1/52]
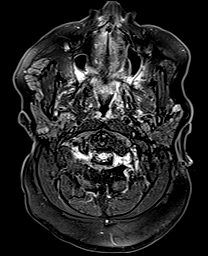
[im 26/52]
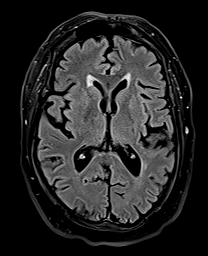
[im 52/52]
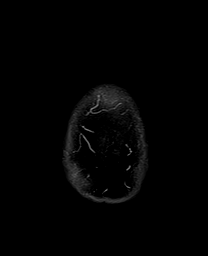

[Series 12: T1 · axial · 1.0mm · 0.94mm/px · z∈[-31,+96]mm · 9 of 144 slices shown (2 of 2)]
[im 1/144]
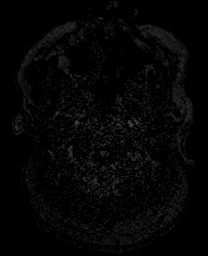
[im 18/144]
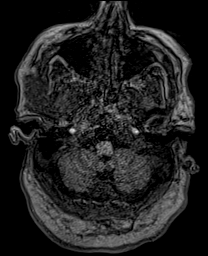
[im 36/144]
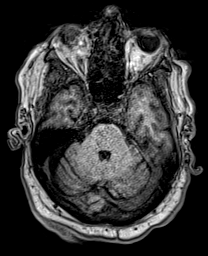
[im 54/144]
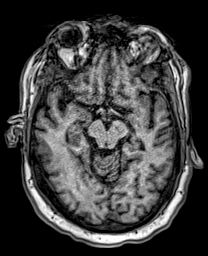
[im 72/144]
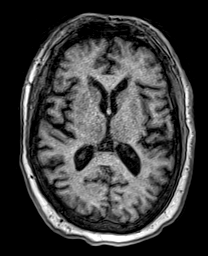
[im 90/144]
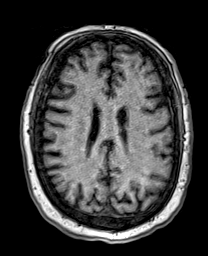
[im 108/144]
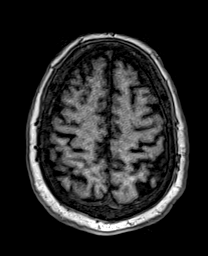
[im 126/144]
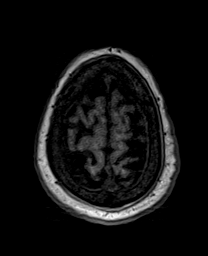
[im 144/144]
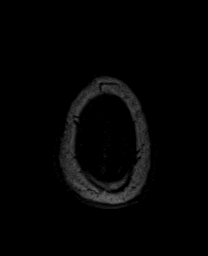

[Series 13: cor dwi_tracew · coronal · 5.0mm · 1.53mm/px · 4 of 60 slices shown]
[im 1/60]
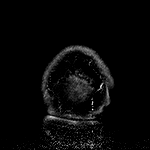
[im 20/60]
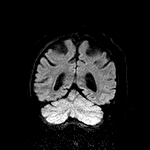
[im 40/60]
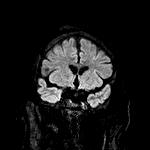
[im 60/60]
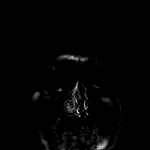

[Series 14: cor dwi_adc · coronal · 5.0mm · 1.53mm/px · 2 of 30 slices shown]
[im 1/30]
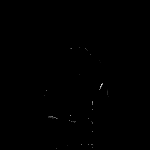
[im 30/30]
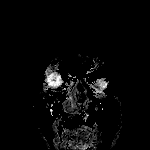

[Series 15: T2 post-contrast · coronal · 5.0mm · 0.69mm/px · 2 of 30 slices shown]
[im 1/30]
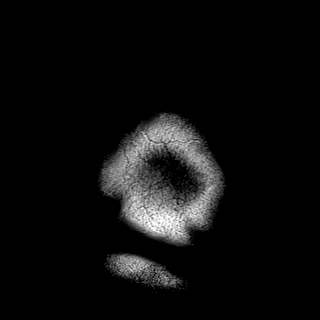
[im 30/30]
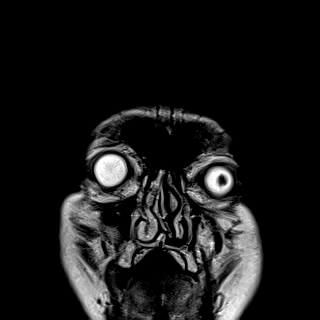

[Series 16: T1 post-contrast · axial · 1.0mm · 0.75mm/px · z∈[-31,+96]mm · 9 of 144 slices shown (1 of 3)]
[im 1/144]
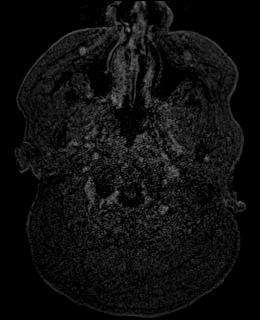
[im 18/144]
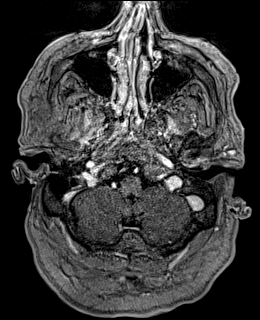
[im 36/144]
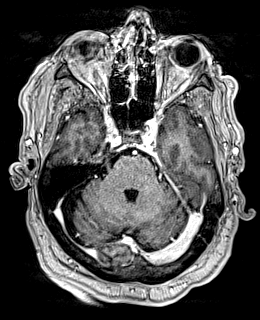
[im 54/144]
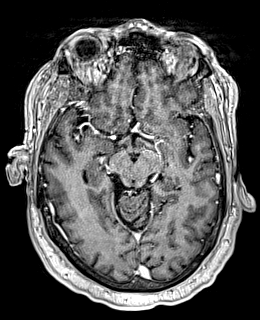
[im 72/144]
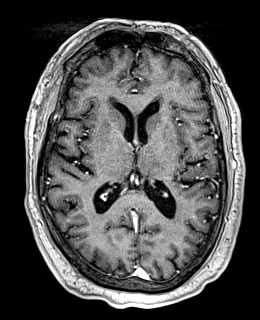
[im 90/144]
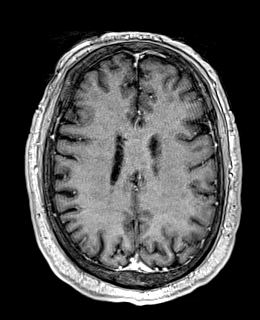
[im 108/144]
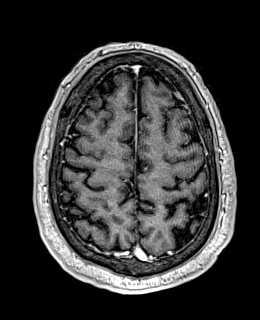
[im 126/144]
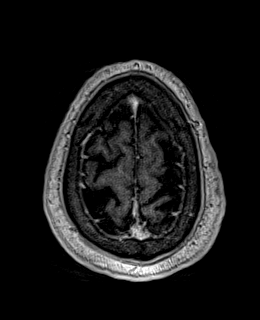
[im 144/144]
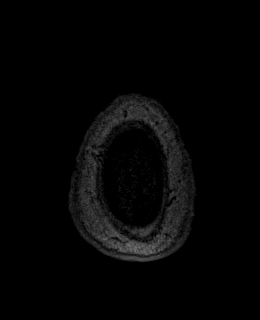

[Series 17: T1 post-contrast · coronal · 5.0mm · 0.43mm/px · 2 of 30 slices shown (2 of 3)]
[im 1/30]
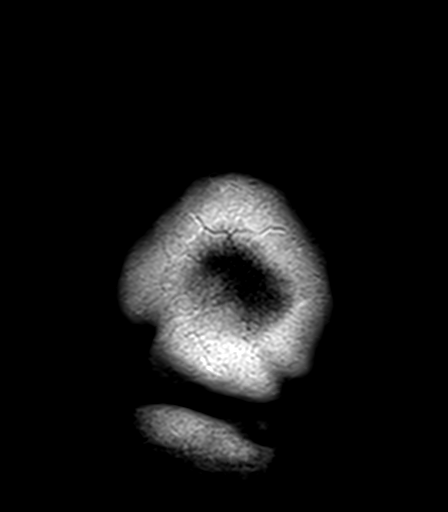
[im 30/30]
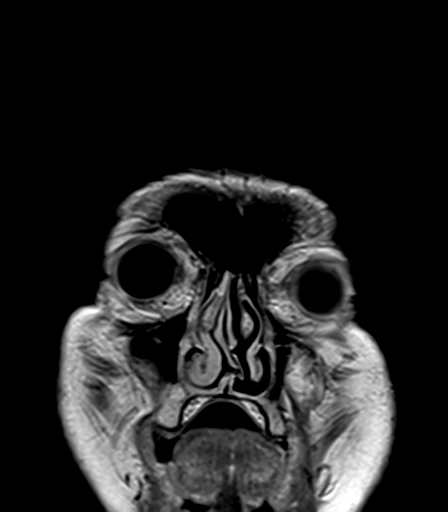

[Series 18: T1 post-contrast · sagittal · 5.0mm · 0.75mm/px · 1 of 24 slices shown (3 of 3)]
[im 1/24]
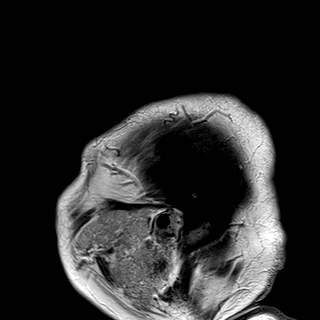

[48 of 48 positions shown; findings below may reference images not displayed]

FINDINGS: Brain: No acute infarct, hemorrhage, or mass lesion is present. The
ventricles are of normal size. No significant extraaxial fluid
collection is present.

Periventricular T2 hyperintensities are mildly advanced for age. No
other significant white matter disease is present. The ventricles
are of normal size. No significant extraaxial fluid collection is
present.

The internal auditory canals are within normal limits. The brainstem
and cerebellum are within normal limits.

Vascular: Flow is present in the major intracranial arteries.

Skull and upper cervical spine: Disc disease at C2-3 and C3-4 is
well is a narrow canal result in some degree of central canal
stenosis. Craniocervical junction is within normal limits. Marrow
signal is unremarkable. No focal lesions are present.

Sinuses/Orbits: A left mastoid effusion is present. Asymmetric
enhancement is present in left nasopharynx. 6 mm mass lesion may be
present. A separate Tornwaldt cyst is noted.

The paranasal sinuses and mastoid air cells are otherwise clear. The
globes and orbits are within normal limits.
IMPRESSION: 1. Left mastoid effusion with asymmetric enhancement and possible 6
mm lesion in the left nasopharynx. Consider further evaluation with
CT neck with contrast and ENT consultation/examination.
2. No evidence for metastatic disease to the brain or meninges.
3. Periventricular T2 hyperintensities are mildly advanced for age.
The finding is nonspecific but can be seen in the setting of chronic
microvascular ischemia, a demyelinating process such as multiple
sclerosis, vasculitis, complicated migraine headaches, or as the
sequelae of a prior infectious or inflammatory process.

## 2023-01-25 NOTE — Telephone Encounter (Signed)
Telephone call
